# Patient Record
Sex: Female | Born: 1955 | Race: Black or African American | Hispanic: No | Marital: Single | Smoking: Never smoker
Health system: Southern US, Community
[De-identification: ages and names within clinical notes are randomized; demographics above are authoritative.]

## PROBLEM LIST (undated history)

## (undated) DIAGNOSIS — B1920 Unspecified viral hepatitis C without hepatic coma: Secondary | ICD-10-CM

## (undated) DIAGNOSIS — I509 Heart failure, unspecified: Secondary | ICD-10-CM

## (undated) DIAGNOSIS — J449 Chronic obstructive pulmonary disease, unspecified: Secondary | ICD-10-CM

## (undated) DIAGNOSIS — I38 Endocarditis, valve unspecified: Secondary | ICD-10-CM

## (undated) DIAGNOSIS — Z9889 Other specified postprocedural states: Secondary | ICD-10-CM

## (undated) DIAGNOSIS — I10 Essential (primary) hypertension: Secondary | ICD-10-CM

## (undated) DIAGNOSIS — R0681 Apnea, not elsewhere classified: Secondary | ICD-10-CM

## (undated) DIAGNOSIS — Z205 Contact with and (suspected) exposure to viral hepatitis: Secondary | ICD-10-CM

## (undated) HISTORY — DX: Unspecified viral hepatitis C without hepatic coma: B19.20

## (undated) HISTORY — DX: Apnea, not elsewhere classified: R06.81

## (undated) HISTORY — DX: Essential (primary) hypertension: I10

## (undated) HISTORY — DX: Endocarditis, valve unspecified: I38

## (undated) HISTORY — DX: Contact with and (suspected) exposure to viral hepatitis: Z20.5

## (undated) HISTORY — PX: TUBAL LIGATION: SHX77

## (undated) HISTORY — DX: Other specified postprocedural states: Z98.890

---

## 2006-04-28 ENCOUNTER — Other Ambulatory Visit: Payer: Self-pay

## 2006-04-28 ENCOUNTER — Inpatient Hospital Stay: Payer: Self-pay | Admitting: Internal Medicine

## 2011-06-06 ENCOUNTER — Inpatient Hospital Stay: Payer: Self-pay | Admitting: Internal Medicine

## 2011-06-06 LAB — COMPREHENSIVE METABOLIC PANEL
Alkaline Phosphatase: 97 U/L (ref 50–136)
Anion Gap: 13 (ref 7–16)
BUN: 9 mg/dL (ref 7–18)
Bilirubin,Total: 0.6 mg/dL (ref 0.2–1.0)
Chloride: 102 mmol/L (ref 98–107)
Creatinine: 1.2 mg/dL (ref 0.60–1.30)
EGFR (African American): >60
Osmolality: 275 (ref 275–301)
SGPT (ALT): 59 U/L
Sodium: 138 mmol/L (ref 136–145)
Total Protein: 9 g/dL — ABNORMAL HIGH (ref 6.4–8.2)

## 2011-06-06 LAB — CK TOTAL AND CKMB (NOT AT ARMC)
CK, Total: 90 U/L (ref 21–215)
CK, Total: 139 U/L (ref 21–215)
CK-MB: <0.5 ng/mL — ABNORMAL LOW (ref 0.5–3.6)
CK-MB: <0.5 ng/mL — ABNORMAL LOW (ref 0.5–3.6)

## 2011-06-06 LAB — TROPONIN I
Troponin-I: <0.02 ng/mL
Troponin-I: <0.02 ng/mL

## 2011-06-06 LAB — CBC WITH DIFFERENTIAL/PLATELET
Eosinophil #: 0 10*3/uL (ref 0.0–0.7)
Eosinophil %: 0.1 %
Lymphocyte #: 3.2 10*3/uL (ref 1.0–3.6)
Lymphocyte %: 14.9 %
MCHC: 33.6 g/dL (ref 32.0–36.0)
MCV: 90 fL (ref 80–100)
Monocyte %: 8.9 %
Platelet: 227 10*3/uL (ref 150–440)
RDW: 13.4 % (ref 11.5–14.5)
WBC: 21.6 10*3/uL — ABNORMAL HIGH (ref 3.6–11.0)

## 2011-06-06 LAB — RAPID INFLUENZA A&B ANTIGENS

## 2011-06-07 LAB — CBC WITH DIFFERENTIAL/PLATELET
Basophil %: 0.1 %
Eosinophil #: 0 10*3/uL (ref 0.0–0.7)
Eosinophil %: 0 %
HCT: 39.6 % (ref 35.0–47.0)
HGB: 13.5 g/dL (ref 12.0–16.0)
Lymphocyte #: 2.1 10*3/uL (ref 1.0–3.6)
Lymphocyte %: 9.6 %
MCH: 30.8 pg (ref 26.0–34.0)
MCHC: 34.1 g/dL (ref 32.0–36.0)
MCV: 90 fL (ref 80–100)
Monocyte #: 0.4 10*3/uL (ref 0.0–0.7)
Monocyte %: 1.9 %
Neutrophil %: 88.4 %
Platelet: 192 10*3/uL (ref 150–440)
RBC: 4.4 10*6/uL (ref 3.80–5.20)

## 2011-06-07 LAB — BASIC METABOLIC PANEL
BUN: 13 mg/dL (ref 7–18)
Chloride: 102 mmol/L (ref 98–107)
Creatinine: 1.24 mg/dL (ref 0.60–1.30)
EGFR (Non-African Amer.): 48 — ABNORMAL LOW
Osmolality: 278 (ref 275–301)
Potassium: 3.9 mmol/L (ref 3.5–5.1)
Sodium: 138 mmol/L (ref 136–145)

## 2011-06-07 LAB — URINALYSIS, COMPLETE
Blood: NEGATIVE
Glucose,UR: NEGATIVE mg/dL (ref 0–75)
Hyaline Cast: 1
Leukocyte Esterase: NEGATIVE
Nitrite: NEGATIVE
Protein: 100
RBC,UR: <1 /HPF (ref 0–5)
WBC UR: 1 /HPF (ref 0–5)

## 2011-06-07 LAB — CK TOTAL AND CKMB (NOT AT ARMC)
CK, Total: 163 U/L (ref 21–215)
CK-MB: 0.7 ng/mL (ref 0.5–3.6)

## 2011-06-08 LAB — CBC WITH DIFFERENTIAL/PLATELET
Basophil %: 0 %
Eosinophil #: 0 10*3/uL (ref 0.0–0.7)
Eosinophil %: 0 %
HGB: 13.2 g/dL (ref 12.0–16.0)
Lymphocyte #: 1.7 10*3/uL (ref 1.0–3.6)
MCH: 30.4 pg (ref 26.0–34.0)
MCHC: 33.6 g/dL (ref 32.0–36.0)
MCV: 91 fL (ref 80–100)
Monocyte #: 1.1 10*3/uL — ABNORMAL HIGH (ref 0.0–0.7)
Platelet: 272 10*3/uL (ref 150–440)
RBC: 4.33 10*6/uL (ref 3.80–5.20)

## 2011-06-09 LAB — CBC WITH DIFFERENTIAL/PLATELET
Basophil #: 0 10*3/uL (ref 0.0–0.1)
Basophil %: 0.3 %
Eosinophil #: 0 10*3/uL (ref 0.0–0.7)
HCT: 38.3 % (ref 35.0–47.0)
HGB: 12.7 g/dL (ref 12.0–16.0)
Lymphocyte #: 3.7 10*3/uL — ABNORMAL HIGH (ref 1.0–3.6)
MCH: 30.2 pg (ref 26.0–34.0)
MCHC: 33.1 g/dL (ref 32.0–36.0)
Monocyte #: 1.5 10*3/uL — ABNORMAL HIGH (ref 0.0–0.7)
Neutrophil #: 13.5 10*3/uL — ABNORMAL HIGH (ref 1.4–6.5)
RDW: 14.4 % (ref 11.5–14.5)
WBC: 18.8 10*3/uL — ABNORMAL HIGH (ref 3.6–11.0)

## 2011-06-12 LAB — CULTURE, BLOOD (SINGLE)

## 2013-02-17 ENCOUNTER — Ambulatory Visit: Payer: Self-pay | Admitting: Internal Medicine

## 2013-03-06 ENCOUNTER — Ambulatory Visit: Payer: Self-pay | Admitting: Primary Care

## 2013-09-03 DIAGNOSIS — I38 Endocarditis, valve unspecified: Secondary | ICD-10-CM | POA: Insufficient documentation

## 2013-09-03 DIAGNOSIS — R0681 Apnea, not elsewhere classified: Secondary | ICD-10-CM

## 2013-09-03 DIAGNOSIS — E782 Mixed hyperlipidemia: Secondary | ICD-10-CM | POA: Insufficient documentation

## 2013-09-03 HISTORY — DX: Apnea, not elsewhere classified: R06.81

## 2013-09-03 HISTORY — DX: Endocarditis, valve unspecified: I38

## 2014-03-24 DIAGNOSIS — Z9889 Other specified postprocedural states: Secondary | ICD-10-CM | POA: Insufficient documentation

## 2014-03-24 HISTORY — DX: Other specified postprocedural states: Z98.890

## 2014-07-18 NOTE — H&P (Signed)
PATIENT NAME:  Anne [name], Anne [name] MR#:  [id number] DATE OF BIRTH:  13-Dec-1955  DATE OF ADMISSION:  06/06/2011  ER REFERRING PHYSICIAN: Arman Filter, MD   PRIMARY CARE PHYSICIAN: None, has seen Dr. Nehemiah Massed in the past.  CHIEF COMPLAINT: Cough, shortness of breath, fevers, as well as chest pain, pressure ongoing for about one week's duration.   HISTORY OF PRESENT ILLNESS: The patient is a 59 year old African American female with previous history of cardiomyopathy, severe MR, who reports that she stopped taking her medications a [place] time ago due to unable to afford these. She has been having complaints of productive cough, she reports dark brownish in color ongoing for about a week, associated with progressive shortness of breath and also wheezing. She also has generalized body aches and reports subjective fevers. She did not take her temperature. She came to the ED and was noted to have a negative chest x-ray; however, her WBC count was in 21,000s. The patient also has been reporting complaint of chest pain as well as pressure. She reports that occurs with coughing as well as also occurs without her respiratory symptoms. She has also been more short of breath and has had dyspnea on exertion. She denies any lower extremity swelling. She denies any nocturnal dyspnea or orthopnea. She denies any syncope. No abdominal pain, nausea, vomiting, or diarrhea.   PAST MEDICAL HISTORY:  1. History of congestive heart failure requiring hospitalization in 2008.  2. History of cardiomyopathy with ejection fraction of 25%, also severe mitral regurgitation.  3. Hypertension.  4. Previous history of cocaine abuse, none now.   PAST SURGICAL HISTORY: None.   ALLERGIES: None.   CURRENT MEDICATIONS: She is not on any medications.   SOCIAL HISTORY: History of smoking for a total of 5 pack-years, quit 10 months ago. She  drinks occasionally. History of cocaine use in the past, none now.   FAMILY HISTORY: Positive for  hypertension.    REVIEW OF SYSTEMS: CONSTITUTIONAL: Complains of fever, fatigue, weakness. No pain. No weight loss. No weight gain. EYES: No blurred or double vision. No pain. No redness. No inflammation. No glaucoma. No cataracts. ENT: No tinnitus. No ear pain. No hearing loss. No seasonal or year-round allergies. No epistaxis. No nasal discharge. No snoring. No postnasal drip. No sinus pains. No difficulty with swallowing. RESPIRATORY: Complains of cough, wheezing. No hemoptysis. Complains of dyspnea. No chronic obstructive pulmonary disease. No tuberculosis. No pneumonia. CARDIOVASCULAR: Complains of chest pain. No orthopnea. No edema. No arrhythmia. No palpitations. Has history of high blood pressure. GI: No nausea, vomiting, diarrhea. No abdominal pain. No hematemesis. No melena. No ulcers. GU: Denies any dysuria, hematuria, renal calculus, or frequency. ENDOCRINE: Denies any polyuria, nocturia, or thyroid problems. HEME/LYMPH: Denies any anemia, easy bruisability, or bleeding. MUSCULOSKELETAL: Denies any pain in the neck, back, or shoulder. NEUROLOGICAL:   No numbness. No cerebrovascular accident. No seizures. No transient ischemic attack. PSYCHIATRIC: No anxiety. No insomnia.   PHYSICAL EXAMINATION:  VITAL SIGNS: Temperature 100.3, pulse 117, respirations 22, blood pressure 196/113.   GENERAL: The patient is in mild respiratory distress with her cough and looks uncomfortable.   HEENT: Head atraumatic, normocephalic. Pupils are equally round, reactive to light and accommodation. Extraocular movements intact. There is no conjunctival pallor or scleral icterus. Extraocular movements are intact. Nasal exam shows no ulceration or drainage. Oropharynx is clear without any exudates.   NECK: There is no thyromegaly or carotid bruits.   CARDIOVASCULAR: Regular rate and rhythm. No murmurs, rubs, clicks, or  gallops. She has a systolic murmur at the apex.   LUNGS: She has got wheezing, especially in the  left upper lobe. No rales. No crackles. No gallops.   ABDOMEN: Soft, nontender, nondistended. Positive bowel sounds x4.  EXTREMITIES: No clubbing, cyanosis, or edema.   SKIN: There is no rash.   LYMPHATICS: No lymph nodes palpable.   VASCULAR: Good DP, PT pulses.   PSYCHIATRIC: Not anxious or depressed.   MUSCULOSKELETAL: There is no erythema or swelling.   SKIN: No rash.   LABORATORY, DIAGNOSTIC AND RADIOLOGICAL DATA:  Glucose is 115, BUN 9, creatinine 1.2, sodium 138, potassium 3.6, chloride 102, CO2 is 23, calcium is 8.8.  LFTs: Total protein 9.0, albumin of 3.3, total bilirubin 0.6, alkaline phosphatase 97, AST 47, ALT 59.  Troponin less than 0.02.  WBC 21.6, hemoglobin 14.9, platelet count 227.  Influenza A and B were negative.  Chest x-ray shows no acute abnormality.   ASSESSMENT AND PLAN: The patient is a 59 year old African American female with history of cardiomyopathy, not on any medications, presents with shortness of breath, cough, congestion for one week with fever.   1. Shortness of breath, cough, congestion: Likely due to severe bronchitis. We will continue IV Rocephin and azithromycin. Treat her with antitussives. Get blood cultures.  2. Bronchospasms: Likely due to acute bronchitis with active wheezing. We will give her nebulizers, steroids.  3. History of severe cardiomyopathy with chest pressure with cough: At this time we will check serial enzymes, repeat an echocardiogram. May need cardiology to follow back up.  4. Hypertension: Not taking any medications. We will start her on lisinopril as well as beta blocker, which will also help with the heart rate.  5. Miscellaneous: I will place her on Lovenox for deep vein thrombosis prophylaxis.     TIME SPENT:   35 minutes.  ____________________________ Lafonda Mosses Posey Pronto, MD shp:cbb D: 06/06/2011 11:18:13 ET T: 06/06/2011 12:23:02 ET JOB#: [id number]  cc: Vestal Crandall H. Posey Pronto, MD, <Dictator> Alric Seton  MD ELECTRONICALLY SIGNED 06/09/2011 13:13

## 2014-07-18 NOTE — Discharge Summary (Signed)
PATIENT NAME:  Anne [name], Anne [name] MR#:  [id number] DATE OF BIRTH:  [birth year]  DATE OF ADMISSION:  06/06/2011 DATE OF DISCHARGE:  06/09/2011  DISCHARGE DIAGNOSES:  1. Chronic obstructive pulmonary disease exacerbation.  2. Acute bronchitis.  3. History of cardiomyopathy with an ejection fraction of 25%.  4. Hypertension.  5. Gastroesophageal reflux disease.   DISCHARGE MEDICATIONS:  1. Lisinopril 10 mg daily.  2. Mucinex 600 mg twice a day. 3. Metoprolol 50 mg twice a day. 4. Omeprazole 20 mg daily.  5. Prednisone 10 mg four tabs daily x3 days then reduce x 10 mg every three days 6. Albuterol metered-dose inhaler 2 puffs every 2 hours p.r.n.  7. Spiriva 1 puff daily.  8. Advair 250/50 one puff twice a day. 9. Zithromax 250 mg daily x3 days. 10. Nebulizer p.r.n.   REASON FOR ADMISSION: This is a 59 year old female who came to the ED with shortness of breath, coughing, and wheezing. She had a negative chest x-ray, but had a white count over 21,000. She was admitted for further treatment.   HOSPITAL COURSE:  1. Chronic obstructive pulmonary disease exacerbation: She was wheezing and in some moderate distress. She was treated with bronchodilators and steroid taper. She will finish the steroid taper at home, continue bronchodilators, and follow-up with the [facility].  2. Acute bronchitis: She was treated with IV antibiotics and this was changed over to p.o. and she will finish a full course. 3. Cardiomyopathy: She was restarted back on her ACE inhibitor and will follow-up again at the [facility].  4. Hypertension: She was started back on an ACE inhibitor and beta blocker.   DISPOSITION: The patient is discharged in stable condition. She will follow-up with the [facility] in 1 to 2 weeks.   TIME SPENT ON DISCHARGE: 35 minutes. ____________________________ Baxter Hire, MD jdj:slb D: 06/09/2011 09:00:27 ET T: 06/09/2011 15:01:56 ET JOB#: [id number]  cc: Baxter Hire,  MD, <Dictator> [facility] Baxter Hire MD ELECTRONICALLY SIGNED 06/09/2011 15:15

## 2014-07-19 ENCOUNTER — Emergency Department
Admit: 2014-07-19 | Disposition: A | Discharge status: Home / Self Care | Payer: Self-pay | Admitting: Emergency Medicine

## 2014-10-11 ENCOUNTER — Other Ambulatory Visit: Payer: Self-pay | Admitting: Primary Care

## 2014-10-11 DIAGNOSIS — Z139 Encounter for screening, unspecified: Secondary | ICD-10-CM

## 2014-10-15 ENCOUNTER — Other Ambulatory Visit: Payer: Self-pay | Admitting: Primary Care

## 2014-10-15 DIAGNOSIS — B1920 Unspecified viral hepatitis C without hepatic coma: Secondary | ICD-10-CM

## 2014-10-15 DIAGNOSIS — R748 Abnormal levels of other serum enzymes: Secondary | ICD-10-CM

## 2014-10-19 ENCOUNTER — Ambulatory Visit
Admission: RE | Admit: 2014-10-19 | Discharge: 2014-10-19 | Disposition: A | Discharge status: Home / Self Care | Payer: Medicaid Other | Source: Ambulatory Visit | Attending: Primary Care | Admitting: Primary Care

## 2014-10-19 DIAGNOSIS — Z139 Encounter for screening, unspecified: Secondary | ICD-10-CM

## 2014-10-19 DIAGNOSIS — Z1231 Encounter for screening mammogram for malignant neoplasm of breast: Secondary | ICD-10-CM | POA: Insufficient documentation

## 2014-10-20 ENCOUNTER — Ambulatory Visit
Admission: RE | Admit: 2014-10-20 | Discharge: 2014-10-20 | Disposition: A | Discharge status: Home / Self Care | Payer: Medicaid Other | Source: Ambulatory Visit | Attending: Primary Care | Admitting: Primary Care

## 2014-10-20 DIAGNOSIS — B1920 Unspecified viral hepatitis C without hepatic coma: Secondary | ICD-10-CM | POA: Insufficient documentation

## 2014-10-20 DIAGNOSIS — R748 Abnormal levels of other serum enzymes: Secondary | ICD-10-CM | POA: Diagnosis present

## 2014-11-16 DIAGNOSIS — I10 Essential (primary) hypertension: Secondary | ICD-10-CM | POA: Insufficient documentation

## 2014-11-16 HISTORY — DX: Essential (primary) hypertension: I10

## 2014-12-06 ENCOUNTER — Encounter: Payer: Self-pay | Admitting: Gastroenterology

## 2014-12-10 DIAGNOSIS — Z205 Contact with and (suspected) exposure to viral hepatitis: Secondary | ICD-10-CM | POA: Insufficient documentation

## 2014-12-10 HISTORY — DX: Contact with and (suspected) exposure to viral hepatitis: Z20.5

## 2014-12-31 NOTE — Telephone Encounter (Signed)
error 

## 2015-01-06 ENCOUNTER — Other Ambulatory Visit: Payer: Self-pay

## 2015-01-10 ENCOUNTER — Ambulatory Visit: Payer: Self-pay | Admitting: Gastroenterology

## 2015-01-12 ENCOUNTER — Other Ambulatory Visit: Payer: Self-pay

## 2015-01-12 ENCOUNTER — Ambulatory Visit (INDEPENDENT_AMBULATORY_CARE_PROVIDER_SITE_OTHER): Payer: Medicaid Other | Admitting: Gastroenterology

## 2015-01-12 VITALS — BP 156/67 | HR 48 | Temp 97.8°F | Ht 63.0 in | Wt 174.0 lb

## 2015-01-12 DIAGNOSIS — B1920 Unspecified viral hepatitis C without hepatic coma: Secondary | ICD-10-CM

## 2015-01-12 DIAGNOSIS — B182 Chronic viral hepatitis C: Secondary | ICD-10-CM

## 2015-01-13 ENCOUNTER — Other Ambulatory Visit
Admission: RE | Admit: 2015-01-13 | Discharge: 2015-01-13 | Disposition: A | Discharge status: Home / Self Care | Payer: Medicaid Other | Source: Ambulatory Visit | Attending: Gastroenterology | Admitting: Gastroenterology

## 2015-01-13 DIAGNOSIS — B182 Chronic viral hepatitis C: Secondary | ICD-10-CM | POA: Insufficient documentation

## 2015-01-13 LAB — CBC WITH DIFFERENTIAL/PLATELET
BASOS ABS: 0.1 10*3/uL (ref 0–0.1)
BASOS PCT: 1 %
EOS ABS: 0.1 10*3/uL (ref 0–0.7)
EOS PCT: 3 %
HCT: 34.9 % — ABNORMAL LOW (ref 35.0–47.0)
Hemoglobin: 11.9 g/dL — ABNORMAL LOW (ref 12.0–16.0)
Lymphocytes Relative: 37 %
Lymphs Abs: 2.1 10*3/uL (ref 1.0–3.6)
MCH: 31.2 pg (ref 26.0–34.0)
MCHC: 34.1 g/dL (ref 32.0–36.0)
MCV: 91.4 fL (ref 80.0–100.0)
Monocytes Absolute: 0.9 10*3/uL (ref 0.2–0.9)
Monocytes Relative: 16 %
NEUTROS PCT: 43 %
Neutro Abs: 2.5 10*3/uL (ref 1.4–6.5)
PLATELETS: 168 10*3/uL (ref 150–440)
RBC: 3.82 MIL/uL (ref 3.80–5.20)
RDW: 14.7 % — ABNORMAL HIGH (ref 11.5–14.5)
WBC: 5.7 10*3/uL (ref 3.6–11.0)

## 2015-01-13 LAB — COMPREHENSIVE METABOLIC PANEL
ALBUMIN: 3.4 g/dL — AB (ref 3.5–5.0)
ALT: 114 U/L — ABNORMAL HIGH (ref 14–54)
ANION GAP: 5 (ref 5–15)
AST: 132 U/L — ABNORMAL HIGH (ref 15–41)
Alkaline Phosphatase: 59 U/L (ref 38–126)
BILIRUBIN TOTAL: 0.7 mg/dL (ref 0.3–1.2)
BUN: 22 mg/dL — ABNORMAL HIGH (ref 6–20)
CHLORIDE: 112 mmol/L — AB (ref 101–111)
CO2: 25 mmol/L (ref 22–32)
Calcium: 9.3 mg/dL (ref 8.9–10.3)
Creatinine, Ser: 1.61 mg/dL — ABNORMAL HIGH (ref 0.44–1.00)
GFR calc Af Amer: 40 mL/min — ABNORMAL LOW (ref >60)
GFR calc non Af Amer: 34 mL/min — ABNORMAL LOW (ref >60)
GLUCOSE: 100 mg/dL — AB (ref 65–99)
POTASSIUM: 4.2 mmol/L (ref 3.5–5.1)
SODIUM: 142 mmol/L (ref 135–145)
TOTAL PROTEIN: 7.3 g/dL (ref 6.5–8.1)

## 2015-01-13 LAB — PROTIME-INR
INR: 1.01
PROTHROMBIN TIME: 13.5 s (ref 11.4–15.0)

## 2015-01-13 NOTE — Progress Notes (Signed)
Gastroenterology Consultation  Referring Provider:     Freddy Finner, NP Primary Care Physician:  Freddy Finner, NP Primary Gastroenterologist:  Dr. Allen Norris     Reason for Consultation:     HCV        HPI:   Anne [name] is a 59 y.o. y/o female referred for consultation & management of Hepatitis C by Freddy Finner, NP.  This patient comes here today with a history of hepatitis C. The patient has antibody to Hepatitis C.The patient reports that she has no high-risk exposure to hepatitis C except she states that she used to be involved with a alcoholic. She denies any IV drug use. She denies any history of transfusions before 1990. The patient also denies any homemade tattoos although she does report snorting cocaine many years ago. The patient was also noted to have abnormal liver enzymes. There is no report of any jaundice or unexplained abdominal pain.  Past Medical History  Diagnosis Date  . Benign essential HTN 11/16/2014  . Heart valve disease 09/03/2013  . Breathlessness on exertion 09/03/2013  . Exposure to hepatitis B 12/10/2014  . History of cardiac catheterization 03/24/2014    Overview:  With normal coronaries 2015   . Hepatitis C     Past Surgical History  Procedure Laterality Date  . Tubal ligation      Prior to Admission medications   Medication Sig Start Date End Date Taking? Authorizing Provider  albuterol (PROAIR HFA) 108 (90 BASE) MCG/ACT inhaler Inhale into the lungs.   Yes Historical Provider, MD  carvedilol (COREG) 25 MG tablet Take by mouth. 12/10/14 12/10/15 Yes Historical Provider, MD  Fluticasone-Salmeterol (ADVAIR DISKUS) 250-50 MCG/DOSE AEPB Inhale into the lungs.   Yes Historical Provider, MD  hydrochlorothiazide (HYDRODIURIL) 25 MG tablet Take by mouth.   Yes Historical Provider, MD  losartan (COZAAR) 100 MG tablet Take by mouth. 03/24/14 03/24/15 Yes Historical Provider, MD  omeprazole (PRILOSEC) 20 MG capsule Take by mouth. 09/29/14 09/29/15 Yes Historical  Provider, MD  tiotropium (SPIRIVA) 18 MCG inhalation capsule Place into inhaler and inhale.   Yes Historical Provider, MD    Family History  Problem Relation Age of Onset  . Hypertension Mother   . Arthritis Mother      Social History  Substance Use Topics  . Smoking status: Never Smoker   . Smokeless tobacco: Never Used  . Alcohol Use: 0.6 oz/week    1 Cans of beer per week     Comment: Occasional     Allergies as of 01/12/2015 - Review Complete 01/12/2015  Allergen Reaction Noted  . Ibuprofen  01/12/2015  . Nitrofuran derivatives Other (See Comments) 01/06/2015    Review of Systems:    All systems reviewed and negative except where noted in HPI.   Physical Exam:  BP 156/67 mmHg  Pulse 48  Temp(Src) 97.8 F (36.6 C) (Oral)  Ht 5\' 3"  (1.6 m)  Wt 174 lb (78.926 kg)  BMI 30.83 kg/m2 No LMP recorded. Patient is postmenopausal. Psych:  Alert and cooperative. Normal mood and affect. General:   Alert,  Well-developed, well-nourished, pleasant and cooperative in NAD Head:  Normocephalic and atraumatic. Eyes:  Sclera clear, no icterus.   Conjunctiva pink. Ears:  Normal auditory acuity. Nose:  No deformity, discharge, or lesions. Mouth:  No deformity or lesions,oropharynx pink & moist. Neck:  Supple; no masses or thyromegaly. Lungs:  Respirations even and unlabored.  Clear throughout to auscultation.   No wheezes, crackles, or rhonchi. No  acute distress. Heart:  Regular rate and rhythm; no murmurs, clicks, rubs, or gallops. Abdomen:  Normal bowel sounds.  No bruits.  Soft, non-tender and non-distended without masses, hepatosplenomegaly or hernias noted.  No guarding or rebound tenderness.  Negative Carnett sign.   Rectal:  Deferred.  Msk:  Symmetrical without gross deformities.  Good, equal movement & strength bilaterally. Pulses:  Normal pulses noted. Extremities:  No clubbing or edema.  No cyanosis. Neurologic:  Alert and oriented x3;  grossly normal  neurologically. Skin:  Intact without significant lesions or rashes.  No jaundice. Lymph Nodes:  No significant cervical adenopathy. Psych:  Alert and cooperative. Normal mood and affect.  Imaging Studies: No results found.  Assessment and Plan:   [name] is a 59 y.o. y/o female Who comes in today with a history of possible hepatitis C. The patient has abnormal liver enzymes and is now being sent for further evaluation. The patient will be set up to have last check for a hepatitis C genotype and viral load. She will also have lab sent off to look for other possible causes of her abnormal liver enzymes. A ultrasound with a liver elasticity test will be ordered. If the patient's hepatitis C comes back as positive she will need treatment for this. The patient has been explained the plan and agrees with it.  Note: This dictation was prepared with Dragon dictation along with smaller phrase technology. Any transcriptional errors that result from this process are unintentional.

## 2015-01-17 ENCOUNTER — Telehealth: Payer: Self-pay

## 2015-01-17 LAB — HEPATITIS C ANTIBODY

## 2015-01-17 LAB — HEPATITIS B SURFACE ANTIBODY, QUANTITATIVE

## 2015-01-17 LAB — ANTI-SMOOTH MUSCLE ANTIBODY, IGG: F-ACTIN AB IGG: 18 U (ref 0–19)

## 2015-01-17 LAB — HCV RNA QUANT
HCV QUANT LOG: 6.049 {Log_IU}/mL (ref >1.70)
HCV Quantitative: 1120000 IU/mL (ref >50)

## 2015-01-17 LAB — HEPATITIS A ANTIBODY, TOTAL: Hep A Total Ab: POSITIVE — AB

## 2015-01-17 LAB — ANTINUCLEAR ANTIBODIES, IFA: ANA Ab, IFA: NEGATIVE

## 2015-01-17 LAB — ALPHA-1 ANTITRYPSIN PHENOTYPE: A1 ANTITRYPSIN SER: 187 mg/dL (ref 90–200)

## 2015-01-17 LAB — IGG, IGA, IGM
IGA: 446 mg/dL — AB (ref 87–352)
IGM, SERUM: 68 mg/dL (ref 26–217)
IgG (Immunoglobin G), Serum: 1334 mg/dL (ref 700–1600)

## 2015-01-17 LAB — CERULOPLASMIN: CERULOPLASMIN: 29.9 mg/dL (ref 19.0–39.0)

## 2015-01-17 LAB — HEPATITIS C GENOTYPE

## 2015-01-17 LAB — MITOCHONDRIAL ANTIBODIES: Mitochondrial M2 Ab, IgG: 15.9 Units (ref 0.0–20.0)

## 2015-01-17 NOTE — Telephone Encounter (Signed)
-----   Message from Lucilla Lame, MD sent at 01/17/2015  1:38 PM EDT ----- Let patient know she is immune to hep A but needs vaccination for B. She also needs to be set up for treatment of her HCV.

## 2015-01-17 NOTE — Telephone Encounter (Signed)
Pt notified of results

## 2015-01-17 NOTE — Telephone Encounter (Signed)
Pt scheduled for RUQ [place] limited and tissue elastography at [facility] on Monday, Oct 31st at 8:00am. Pt was given instructions for scan. Pt was also notified of lab results and the need for Hep B vaccine.

## 2015-01-18 LAB — HEPATITIS B SURFACE ANTIGEN
HEP B S AG: NEGATIVE
HEP B S AG: NEGATIVE

## 2015-01-24 ENCOUNTER — Ambulatory Visit
Admission: RE | Admit: 2015-01-24 | Discharge: 2015-01-24 | Disposition: A | Discharge status: Home / Self Care | Payer: Medicaid Other | Source: Ambulatory Visit | Attending: Gastroenterology | Admitting: Gastroenterology

## 2015-01-24 DIAGNOSIS — B182 Chronic viral hepatitis C: Secondary | ICD-10-CM | POA: Insufficient documentation

## 2015-01-26 ENCOUNTER — Telehealth: Payer: Self-pay

## 2015-01-26 NOTE — Telephone Encounter (Signed)
-----   Message from Lucilla Lame, MD sent at 01/24/2015 11:42 AM EDT ----- Let the patient know that her fibrosis score was asked to F2-F3 out of 4. She should also be started on her treatment for hep C when approved.

## 2015-01-26 NOTE — Telephone Encounter (Signed)
Pt notified of [place] results. Paperwork completed and faxed to [employer]. Waiting for approval.

## 2015-02-11 ENCOUNTER — Telehealth: Payer: Self-pay | Admitting: Gastroenterology

## 2015-02-11 NOTE — Telephone Encounter (Signed)
Patient wanted to let you know that she started her medication for Hep C on Wednesday 11/16

## 2015-02-12 NOTE — Telephone Encounter (Signed)
Noted  

## 2015-03-22 ENCOUNTER — Telehealth: Payer: Self-pay | Admitting: Gastroenterology

## 2015-03-22 NOTE — Telephone Encounter (Signed)
Medication for Hep C is making her weak. Is there anything she can do about it? Please call.

## 2015-03-23 NOTE — Telephone Encounter (Signed)
Spoke with pt regarding her symptoms of fatigue. Advised pt unfortunately this is a side of the Hep C medication and there is nothing we can give her to increase her energy. Told her that once her treatment has been completed she should start feeling back to normal, but until then get plenty of rest and drinks lots of fluid. Pt understood and will contact me if any other symptoms occur.

## 2015-03-24 ENCOUNTER — Telehealth: Payer: Self-pay | Admitting: Gastroenterology

## 2015-03-24 ENCOUNTER — Other Ambulatory Visit: Payer: Self-pay

## 2015-03-24 DIAGNOSIS — B182 Chronic viral hepatitis C: Secondary | ICD-10-CM

## 2015-03-24 NOTE — Telephone Encounter (Signed)
Medicaid called asking for updates labs on patient for the new year refill, didn't see that any have been done, She said send patient for labs

## 2015-03-24 NOTE — Telephone Encounter (Signed)
Patient has a question about medication

## 2015-03-24 NOTE — Telephone Encounter (Signed)
Spoke with pt regarding labs needing to be repeat. Pt will go tomorrow to [facility]. Order has been put in EPIC.

## 2015-03-25 ENCOUNTER — Other Ambulatory Visit
Admission: RE | Admit: 2015-03-25 | Discharge: 2015-03-25 | Disposition: A | Discharge status: Home / Self Care | Payer: Medicaid Other | Source: Ambulatory Visit | Attending: Gastroenterology | Admitting: Gastroenterology

## 2015-03-25 DIAGNOSIS — B182 Chronic viral hepatitis C: Secondary | ICD-10-CM

## 2015-03-25 NOTE — Telephone Encounter (Signed)
Tried contacting pt but no vm was available to leave message.

## 2015-03-28 ENCOUNTER — Inpatient Hospital Stay
Admission: EM | Admit: 2015-03-28 | Discharge: 2015-03-31 | DRG: 314 | Disposition: A | Discharge status: Home / Self Care | Payer: Medicaid Other | Attending: Internal Medicine | Admitting: Internal Medicine

## 2015-03-28 DIAGNOSIS — I10 Essential (primary) hypertension: Secondary | ICD-10-CM | POA: Diagnosis present

## 2015-03-28 DIAGNOSIS — N170 Acute kidney failure with tubular necrosis: Secondary | ICD-10-CM | POA: Diagnosis present

## 2015-03-28 DIAGNOSIS — R531 Weakness: Secondary | ICD-10-CM | POA: Diagnosis present

## 2015-03-28 DIAGNOSIS — I959 Hypotension, unspecified: Secondary | ICD-10-CM | POA: Diagnosis present

## 2015-03-28 DIAGNOSIS — Y92009 Unspecified place in unspecified non-institutional (private) residence as the place of occurrence of the external cause: Secondary | ICD-10-CM | POA: Diagnosis not present

## 2015-03-28 DIAGNOSIS — R112 Nausea with vomiting, unspecified: Secondary | ICD-10-CM | POA: Diagnosis present

## 2015-03-28 DIAGNOSIS — T465X5A Adverse effect of other antihypertensive drugs, initial encounter: Secondary | ICD-10-CM | POA: Diagnosis present

## 2015-03-28 DIAGNOSIS — J449 Chronic obstructive pulmonary disease, unspecified: Secondary | ICD-10-CM | POA: Diagnosis present

## 2015-03-28 DIAGNOSIS — Z79899 Other long term (current) drug therapy: Secondary | ICD-10-CM

## 2015-03-28 DIAGNOSIS — T375X5A Adverse effect of antiviral drugs, initial encounter: Secondary | ICD-10-CM | POA: Diagnosis present

## 2015-03-28 DIAGNOSIS — N179 Acute kidney failure, unspecified: Secondary | ICD-10-CM | POA: Diagnosis present

## 2015-03-28 DIAGNOSIS — D649 Anemia, unspecified: Secondary | ICD-10-CM | POA: Diagnosis present

## 2015-03-28 DIAGNOSIS — Z8261 Family history of arthritis: Secondary | ICD-10-CM | POA: Diagnosis not present

## 2015-03-28 DIAGNOSIS — E875 Hyperkalemia: Principal | ICD-10-CM | POA: Diagnosis present

## 2015-03-28 DIAGNOSIS — Z8249 Family history of ischemic heart disease and other diseases of the circulatory system: Secondary | ICD-10-CM

## 2015-03-28 DIAGNOSIS — B1920 Unspecified viral hepatitis C without hepatic coma: Secondary | ICD-10-CM | POA: Diagnosis present

## 2015-03-28 DIAGNOSIS — I5032 Chronic diastolic (congestive) heart failure: Secondary | ICD-10-CM | POA: Diagnosis present

## 2015-03-28 HISTORY — DX: Chronic obstructive pulmonary disease, unspecified: J44.9

## 2015-03-28 HISTORY — DX: Heart failure, unspecified: I50.9

## 2015-03-28 LAB — COMPREHENSIVE METABOLIC PANEL
ALT: 48 U/L (ref 14–54)
AST: 26 U/L (ref 15–41)
Albumin: 3.2 g/dL — ABNORMAL LOW (ref 3.5–5.0)
Alkaline Phosphatase: 56 U/L (ref 38–126)
Anion gap: 5 (ref 5–15)
BILIRUBIN TOTAL: 1.2 mg/dL (ref 0.3–1.2)
BUN: 40 mg/dL — AB (ref 6–20)
CALCIUM: 9.3 mg/dL (ref 8.9–10.3)
CO2: 19 mmol/L — ABNORMAL LOW (ref 22–32)
CREATININE: 3.5 mg/dL — AB (ref 0.44–1.00)
Chloride: 113 mmol/L — ABNORMAL HIGH (ref 101–111)
GFR calc Af Amer: 16 mL/min — ABNORMAL LOW (ref >60)
GFR, EST NON AFRICAN AMERICAN: 13 mL/min — AB (ref >60)
Glucose, Bld: 123 mg/dL — ABNORMAL HIGH (ref 65–99)
Potassium: 7.3 mmol/L (ref 3.5–5.1)
Sodium: 137 mmol/L (ref 135–145)
TOTAL PROTEIN: 6.7 g/dL (ref 6.5–8.1)

## 2015-03-28 LAB — URINALYSIS COMPLETE WITH MICROSCOPIC (ARMC ONLY)
BILIRUBIN URINE: NEGATIVE
Bacteria, UA: NONE SEEN
Glucose, UA: NEGATIVE mg/dL
Hgb urine dipstick: NEGATIVE
KETONES UR: NEGATIVE mg/dL
NITRITE: NEGATIVE
Protein, ur: NEGATIVE mg/dL
Specific Gravity, Urine: 1.008 (ref 1.005–1.030)
pH: 5 (ref 5.0–8.0)

## 2015-03-28 LAB — CBC WITH DIFFERENTIAL/PLATELET
BASOS ABS: 0 10*3/uL (ref 0–0.1)
BASOS PCT: 0 %
EOS ABS: 0.1 10*3/uL (ref 0–0.7)
Eosinophils Relative: 1 %
HCT: 22 % — ABNORMAL LOW (ref 35.0–47.0)
HEMOGLOBIN: 7.2 g/dL — AB (ref 12.0–16.0)
Lymphocytes Relative: 19 %
Lymphs Abs: 1.5 10*3/uL (ref 1.0–3.6)
MCH: 30.6 pg (ref 26.0–34.0)
MCHC: 32.7 g/dL (ref 32.0–36.0)
MCV: 93.5 fL (ref 80.0–100.0)
MONOS PCT: 10 %
Monocytes Absolute: 0.8 10*3/uL (ref 0.2–0.9)
NEUTROS PCT: 70 %
Neutro Abs: 5.5 10*3/uL (ref 1.4–6.5)
Platelets: 218 10*3/uL (ref 150–440)
RBC: 2.36 MIL/uL — ABNORMAL LOW (ref 3.80–5.20)
RDW: 13.9 % (ref 11.5–14.5)
WBC: 8 10*3/uL (ref 3.6–11.0)

## 2015-03-28 LAB — ABO/RH: ABO/RH(D): O POS

## 2015-03-28 LAB — LACTIC ACID, PLASMA: LACTIC ACID, VENOUS: 1.4 mmol/L (ref 0.5–2.0)

## 2015-03-28 LAB — PREPARE RBC (CROSSMATCH)

## 2015-03-28 MED ORDER — HEPARIN SODIUM (PORCINE) 5000 UNIT/ML IJ SOLN
5000.0000 [IU] | Freq: Three times a day (TID) | INTRAMUSCULAR | Status: DC
  Administered 2015-03-29 – 2015-03-31 (×7): 5000 [IU] via SUBCUTANEOUS
  Filled 2015-03-28 (×7): qty 1

## 2015-03-28 MED ORDER — SODIUM CHLORIDE 0.9 % IV SOLN
INTRAVENOUS | Status: DC
  Administered 2015-03-29 – 2015-03-31 (×6): via INTRAVENOUS

## 2015-03-28 MED ORDER — SODIUM CHLORIDE 0.9 % IV SOLN
1.0000 g | Freq: Once | INTRAVENOUS | Status: AC
  Administered 2015-03-29: 1 g via INTRAVENOUS
  Filled 2015-03-28: qty 10

## 2015-03-28 MED ORDER — SODIUM CHLORIDE 0.9 % IV BOLUS (SEPSIS)
1000.0000 mL | Freq: Once | INTRAVENOUS | Status: AC
  Administered 2015-03-29: 1000 mL via INTRAVENOUS

## 2015-03-28 MED ORDER — ACETAMINOPHEN 325 MG PO TABS: 650.0000 mg | ORAL_TABLET | Freq: Four times a day (QID) | ORAL | Status: DC | PRN

## 2015-03-28 MED ORDER — DEXTROSE 50 % IV SOLN
25.0000 mL | Freq: Once | INTRAVENOUS | Status: AC
  Administered 2015-03-29: 25 mL via INTRAVENOUS
  Filled 2015-03-28: qty 50

## 2015-03-28 MED ORDER — OXYCODONE HCL 5 MG PO TABS
5.0000 mg | ORAL_TABLET | ORAL | Status: DC | PRN
Start: 2015-03-28 — End: 2015-03-31

## 2015-03-28 MED ORDER — ONDANSETRON HCL 4 MG/2ML IJ SOLN: 4.0000 mg | Freq: Four times a day (QID) | INTRAMUSCULAR | Status: DC | PRN

## 2015-03-28 MED ORDER — MORPHINE SULFATE (PF) 2 MG/ML IV SOLN
2.0000 mg | INTRAVENOUS | Status: DC | PRN
Start: 2015-03-28 — End: 2015-03-31

## 2015-03-28 MED ORDER — INSULIN ASPART 100 UNIT/ML ~~LOC~~ SOLN
SUBCUTANEOUS | Status: AC
  Administered 2015-03-29: 10 [IU] via INTRAVENOUS
  Filled 2015-03-28: qty 10

## 2015-03-28 MED ORDER — INSULIN ASPART 100 UNIT/ML IV SOLN
10.0000 [IU] | Freq: Once | INTRAVENOUS | Status: AC
  Administered 2015-03-29: 10 [IU] via INTRAVENOUS

## 2015-03-28 MED ORDER — SODIUM CHLORIDE 0.9 % IV BOLUS (SEPSIS)
1000.0000 mL | Freq: Once | INTRAVENOUS | Status: AC
  Administered 2015-03-28: 1000 mL via INTRAVENOUS

## 2015-03-28 MED ORDER — SODIUM CHLORIDE 0.9 % IV SOLN
10.0000 mL/h | Freq: Once | INTRAVENOUS | Status: AC
  Administered 2015-03-29: 10 mL/h via INTRAVENOUS

## 2015-03-28 MED ORDER — ONDANSETRON HCL 4 MG PO TABS: 4.0000 mg | ORAL_TABLET | Freq: Four times a day (QID) | ORAL | Status: DC | PRN

## 2015-03-28 MED ORDER — SODIUM CHLORIDE 0.9 % IJ SOLN
3.0000 mL | Freq: Two times a day (BID) | INTRAMUSCULAR | Status: DC
  Administered 2015-03-29: 3 mL via INTRAVENOUS

## 2015-03-28 MED ORDER — SODIUM POLYSTYRENE SULFONATE 15 GM/60ML PO SUSP
30.0000 g | Freq: Once | ORAL | Status: AC
  Administered 2015-03-29: 30 g via ORAL
  Filled 2015-03-28: qty 120

## 2015-03-28 MED ORDER — ACETAMINOPHEN 650 MG RE SUPP: 650.0000 mg | Freq: Four times a day (QID) | RECTAL | Status: DC | PRN

## 2015-03-28 NOTE — ED Notes (Signed)
Pt states she was put on "double her dose of high BP meds on December 20th" and then her BP was checked by her doctor and her BP was low. Her doctor told her they might need to lower her medication since her BP was low last time it was checked. Today pt fell in kitchen, then fell on to bed. Pt states her legs felt weak and just gave out. Pt states no weakness or dizziness at this moment, no headache, no CP. Pt states she threw up once today. EMS gave 588ml bolus en route for low BP.

## 2015-03-28 NOTE — ED Notes (Signed)
Pt presents to ED via EMS with hypotension. Pt has diarrhea, N&V.

## 2015-03-28 NOTE — ED Provider Notes (Signed)
[facility] Emergency Department Provider Note   ____________________________________________  Time seen: On EMS arrival  I have reviewed the triage vital signs and the nursing notes.   HISTORY  Chief Complaint Hypotension   History limited by: Not Limited   HPI Anne [name] is a 60 y.o. female who presents to the emergency department today via EMS because of concerns for weakness, diarrhea. The patient states that she has been having issues with diarrhea for the past couple of months since starting a new medication for her hepatitis C. She states for the past week though she has developed nausea and vomiting as well as the diarrhea. She has had multiple episodes. She describes it as watery. She has not noticed any blood in the vomit or diarrhea. During this time she has felt increasingly weak. She did have a fall. Furthermore the patient states that she has been adjusting her blood pressure medication with her primary care doctor. She denies any significant abdominal pain. Denies any fevers.    Past Medical History  Diagnosis Date  . Benign essential HTN 11/16/2014  . Heart valve disease 09/03/2013  . Breathlessness on exertion 09/03/2013  . Exposure to hepatitis B 12/10/2014  . History of cardiac catheterization 03/24/2014    Overview:  With normal coronaries 2015   . Hepatitis C   . CHF (congestive heart failure) ([place])   . COPD (chronic obstructive pulmonary disease) [facility])     Patient Active Problem List   Diagnosis Date Noted  . Hepatitis C 01/12/2015  . Exposure to hepatitis B 12/10/2014  . Benign essential HTN 11/16/2014  . History of cardiac catheterization 03/24/2014  . Breathlessness on exertion 09/03/2013  . 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 Heart valve disease 09/03/2013    Past Surgical History  Procedure Laterality Date  . Tubal ligation      Current Outpatient Rx  Name  Route  Sig  Dispense  Refill  .  albuterol (PROAIR HFA) 108 (90 BASE) MCG/ACT inhaler   Inhalation   Inhale into the lungs.         . carvedilol (COREG) 25 MG tablet   Oral   Take by mouth.         . Dasab-Ombitas-Paritapre-Ritona (VIEKIRA XR) 200-8.33-50- 33.33 MG TB24   Oral   Take by mouth.         . Fluticasone-Salmeterol (ADVAIR DISKUS) 250-50 MCG/DOSE AEPB   Inhalation   Inhale into the lungs.         . hydrochlorothiazide (HYDRODIURIL) 25 MG tablet   Oral   Take by mouth.         . EXPIRED: losartan (COZAAR) 100 MG tablet   Oral   Take by mouth.         Marland Kitchen omeprazole (PRILOSEC) 20 MG capsule   Oral   Take by mouth.         . ribavirin (REBETOL) 200 MG capsule   Oral   Take by mouth.         . tiotropium (SPIRIVA) 18 MCG inhalation capsule   Inhalation   Place into inhaler and inhale.           Allergies Ibuprofen and Nitrofuran derivatives  Family History  Problem Relation Age of Onset  . Hypertension Mother   . Arthritis Mother     Social History Social History  Substance Use Topics  . Smoking status: Never Smoker   . Smokeless tobacco: Never Used  .  Alcohol Use: 0.6 oz/week    1 Cans of beer per week     Comment: Occasional     Review of Systems  Constitutional: Negative for fever. Cardiovascular: Negative for chest pain. Respiratory: Negative for shortness of breath. Gastrointestinal: Negative for abdominal pain. Positive for nausea vomiting and diarrhea Neurological: Negative for headaches, focal weakness or numbness.   10-point ROS otherwise negative.  ____________________________________________   PHYSICAL EXAM:  VITAL SIGNS: ED Triage Vitals  Enc Vitals Group     BP 03/28/15 2029 86/58 mmHg     Pulse Rate 03/28/15 2029 82     Resp 03/28/15 2029 14     Temp 03/28/15 2029 97.9 F (36.6 C)     Temp src --      SpO2 03/28/15 2029 99 %     Weight 03/28/15 2029 155 lb (70.308 kg)     Height 03/28/15 2029 5\' 3"  (1.6 m)     Head Cir --       Peak Flow --      Pain Score 03/28/15 2026 0   Constitutional: Alert and oriented. Well appearing and in no distress. Eyes: Conjunctivae are normal. PERRL. Normal extraocular movements. ENT   Head: Normocephalic and atraumatic.   Nose: No congestion/rhinnorhea.   Mouth/Throat: Mucous membranes are moist.   Neck: No stridor. Hematological/Lymphatic/Immunilogical: No cervical lymphadenopathy. Cardiovascular: Normal rate, regular rhythm.  No murmurs, rubs, or gallops. Respiratory: Normal respiratory effort without tachypnea nor retractions. Breath sounds are clear and equal bilaterally. No wheezes/rales/rhonchi. Gastrointestinal: Soft and nontender. No distention. GUIAC negative. Brown stool on the glove. Genitourinary: Deferred Musculoskeletal: Normal range of motion in all extremities. No joint effusions.  No lower extremity tenderness nor edema. Neurologic:  Normal speech and language. No gross focal neurologic deficits are appreciated.  Skin:  Skin is warm, dry and intact. No rash noted. Psychiatric: Mood and affect are normal. Speech and behavior are normal. Patient exhibits appropriate insight and judgment.  ____________________________________________    LABS (pertinent positives/negatives)  Hgb 7.2 Bmp pending ____________________________________________   EKG  None  ____________________________________________    RADIOLOGY  None   ____________________________________________   PROCEDURES  Procedure(s) performed: None  Critical Care performed: Yes, see critical care note(s)  CRITICAL CARE Performed by: Nance Pear   Total critical care time: 30 minutes  Critical care time was exclusive of separately billable procedures and treating other patients.  Critical care was necessary to treat or prevent imminent or life-threatening deterioration.  Critical care was time spent personally by me on the following activities: development of  treatment plan with patient and/or surrogate as well as nursing, discussions with consultants, evaluation of patient's response to treatment, examination of patient, obtaining history from patient or surrogate, ordering and performing treatments and interventions, ordering and review of laboratory studies, ordering and review of radiographic studies, pulse oximetry and re-evaluation of patient's condition.  ____________________________________________   INITIAL IMPRESSION / ASSESSMENT AND PLAN / ED COURSE  Pertinent labs & imaging results that were available during my care of the patient were reviewed by me and considered in my medical decision making (see chart for details).  Patient presented to the emergency department today he comes of concern for weakness. Per EMS patient was very hypotensive and continued to be hypotensive here in the emergency department. CBC was concerning for anemia. In light of anemia and hypotension will plan on giving transfusion of blood work. BMP was pending at time of admission to the hospital service.  ____________________________________________  FINAL CLINICAL IMPRESSION(S) / ED DIAGNOSES  Final diagnoses:  Anemia, unspecified anemia type  Weakness  Hypotension, unspecified hypotension type     Nance Pear, MD 03/29/15 1420

## 2015-03-28 NOTE — ED Notes (Signed)
Ed Blalock, [phone], Daughter, call to update on room number when admitted.

## 2015-03-28 NOTE — H&P (Signed)
[facility] [place] NAME: Anne [name]    MR#:  [id number]  DATE OF BIRTH:  1955/12/24   DATE OF ADMISSION:  03/28/2015  PRIMARY CARE PHYSICIAN: Freddy Finner, NP   REQUESTING/REFERRING PHYSICIAN: Archie Balboa  CHIEF COMPLAINT:   Chief Complaint  Patient presents with  . Hypotension    HISTORY OF PRESENT ILLNESS:  Anne [name]  is a 60 y.o. female with a known history of essential hypertension, hepatitis C who is presenting with weakness. She describes 2 day duration of nausea vomiting nonbloody nonbilious emesis with decreased oral intake as well as one day duration of diarrhea. However, today she was weak and had difficulty ambulating and felt lightheaded. Because of these symptoms decided to present to the Hospital further workup and evaluation. She was noted to be hypotensive on arrival which has improved after IV fluid hydration. Of note she has had a recent increase of her blood pressure medications on December 20 however she is unsure which medication she has increased. In the emergency department workup revealed anemia, fecal occult blood test negative given hypotension as well as anemia the emergent department staff decided to order a blood transfusion.  PAST MEDICAL HISTORY:   Past Medical History  Diagnosis Date  . Benign essential HTN 11/16/2014  . Heart valve disease 09/03/2013  . Breathlessness on exertion 09/03/2013  . Exposure to hepatitis B 12/10/2014  . History of cardiac catheterization 03/24/2014    Overview:  With normal coronaries 2015   . Hepatitis C   . CHF (congestive heart failure) ([place])   . COPD (chronic obstructive pulmonary disease) ([place])     PAST SURGICAL HISTORY:   Past Surgical History  Procedure Laterality Date  . Tubal ligation      SOCIAL HISTORY:   Social History  Substance Use Topics  . Smoking status: Never Smoker   . Smokeless tobacco: Never Used  . Alcohol Use: 0.6 oz/week    1 Cans of  beer per week     Comment: Occasional     FAMILY HISTORY:   Family History  Problem Relation Age of Onset  . Hypertension Mother   . Arthritis Mother     DRUG ALLERGIES:   Allergies  Allergen Reactions  . Ibuprofen Other (See Comments)    Reaction:  Unknown   . Nitrofuran Derivatives Other (See Comments)    Reaction:  Unknown     REVIEW OF SYSTEMS:  REVIEW OF SYSTEMS:  CONSTITUTIONAL: Denies fevers, chills, positive fatigue, weakness.  EYES: Denies blurred vision, double vision, or eye pain.  EARS, NOSE, THROAT: Denies tinnitus, ear pain, hearing loss.  RESPIRATORY: denies cough, shortness of breath, wheezing  CARDIOVASCULAR: Denies chest pain, palpitations, edema.  GASTROINTESTINAL: Positive nausea, vomiting, diarrhea, denies abdominal pain.  GENITOURINARY: Denies dysuria, hematuria.  ENDOCRINE: Denies nocturia or thyroid problems. HEMATOLOGIC AND LYMPHATIC: Denies easy bruising or bleeding.  SKIN: Denies rash or lesions.  MUSCULOSKELETAL: Denies pain in neck, back, shoulder, knees, hips, or further arthritic symptoms.  NEUROLOGIC: Denies paralysis, paresthesias.  PSYCHIATRIC: Denies anxiety or depressive symptoms. Otherwise full review of systems performed by me is negative.   MEDICATIONS AT HOME:   Prior to Admission medications   Medication Sig Start Date End Date Taking? Authorizing Provider  albuterol (PROVENTIL HFA;VENTOLIN HFA) 108 (90 Base) MCG/ACT inhaler Inhale 2 puffs into the lungs every 6 (six) hours as needed for wheezing or shortness of breath.   Yes Historical Provider, MD  carvedilol (COREG)  25 MG tablet Take 25 mg by mouth 2 (two) times daily with a meal.    Yes Historical Provider, MD  Dasab-Ombitas-Paritapre-Ritona (VIEKIRA XR) 200-8.33-50- 33.33 MG TB24 Take 1 tablet by mouth 2 (two) times daily.   Yes Historical Provider, MD  Fluticasone-Salmeterol (ADVAIR DISKUS) 250-50 MCG/DOSE AEPB Inhale 1 puff into the lungs 2 (two) times daily.    Yes  Historical Provider, MD  losartan (COZAAR) 50 MG tablet Take 100 mg by mouth daily.   Yes Historical Provider, MD  omeprazole (PRILOSEC) 20 MG capsule Take 20 mg by mouth daily.    Yes Historical Provider, MD  ribavirin (REBETOL) 200 MG capsule Take 600 mg by mouth 2 (two) times daily.    Yes Historical Provider, MD  spironolactone (ALDACTONE) 50 MG tablet Take 50 mg by mouth daily.   Yes Historical Provider, MD  tiotropium (SPIRIVA) 18 MCG inhalation capsule Place 18 mcg into inhaler and inhale daily.    Yes Historical Provider, MD      VITAL SIGNS:  Blood pressure 96/53, pulse 69, temperature 98.3 F (36.8 C), temperature source Rectal, resp. rate 15, height 5\' 3"  (1.6 m), weight 155 lb (70.308 kg), SpO2 100 %.  PHYSICAL EXAMINATION:  VITAL SIGNS: Filed Vitals:   03/28/15 2218 03/28/15 2245  BP: 111/62 96/53  Pulse: 54 69  Temp: 98.3 F (36.8 C)   Resp: 29 15   GENERAL:59 y.o.female currently in no acute distress.  HEAD: Normocephalic, atraumatic.  EYES: Pupils equal, round, reactive to light. Extraocular muscles intact. No scleral icterus.  MOUTH: Moist mucosal membrane. Dentition intact. No abscess noted.  EAR, NOSE, THROAT: Clear without exudates. No external lesions.  NECK: Supple. No thyromegaly. No nodules. No JVD.  PULMONARY: Clear to ascultation, without wheeze rails or rhonci. No use of accessory muscles, Good respiratory effort. good air entry bilaterally CHEST: Nontender to palpation.  CARDIOVASCULAR: S1 and S2. Regular rate and rhythm. No murmurs, rubs, or gallops. No edema. Pedal pulses 2+ bilaterally.  GASTROINTESTINAL: Soft, nontender, nondistended. No masses. Positive bowel sounds. No hepatosplenomegaly.  MUSCULOSKELETAL: No swelling, clubbing, or edema. Range of motion full in all extremities.  NEUROLOGIC: Cranial nerves II through XII are intact. No gross focal neurological deficits. Sensation intact. Reflexes intact.  SKIN: No ulceration, lesions, rashes, or  cyanosis. Skin warm and dry. Turgor intact.  PSYCHIATRIC: Mood, affect within normal limits. The patient is awake, alert and oriented x 3. Insight, judgment intact.    LABORATORY PANEL:   CBC  Recent Labs Lab 03/28/15 2045  WBC 8.0  HGB 7.2*  HCT 22.0*  PLT 218   ------------------------------------------------------------------------------------------------------------------  Chemistries   Recent Labs Lab 03/28/15 2045  NA 137  K 7.3*  CL 113*  CO2 19*  GLUCOSE 123*  BUN 40*  CREATININE 3.50*  CALCIUM 9.3  AST 26  ALT 48  ALKPHOS 56  BILITOT 1.2   ------------------------------------------------------------------------------------------------------------------  Cardiac Enzymes No results for input(s): TROPONINI in the last 168 hours. ------------------------------------------------------------------------------------------------------------------  RADIOLOGY:  No results found.  EKG:   Orders placed or performed during the hospital encounter of 03/28/15  . EKG 12-Lead  . EKG 12-Lead    IMPRESSION AND PLAN:   61 year old African-American female history of hepatitis C who is presenting with weakness and be hypotensive  1. Acute renal failure: Likely multifactorial including decreased oral intake, vomiting diarrhea, increased blood pressure medications. IV fluid hydration following urine output/renal function 2. Hyperkalemia: Likely in relation to multiple medications with side effects causing hyperkalemia 5 order  her to receive calcium, Kayexalate, insulin, dextrose follow potassium levels every 6 hoursuntil downward trend 3. Anemia, normocytic: This is likely relation to the ribavirin she is taking since November 2016 she has no active bleeding she has been ordered to receive blood in the emergency department 4. Essential hypertension: Given she is hypotensive. Hold further medications for now 5. Venous thromboembolism prophylactic: Heparin  subcutaneous    All the records are reviewed and case discussed with ED provider. Management plans discussed with the patient, family and they are in agreement.  CODE STATUS: Full  TOTAL TIME TAKING CARE OF THIS PATIENT: 45 minutes.    Hower,  Karenann Cai.D on 03/28/2015 at 11:32 PM  Between 7am to 6pm - Pager - [phone]  After 6pm: House Pager: [phone]  [facility]  Office  [phone]  CC: Primary care physician; Freddy Finner, NP

## 2015-03-29 LAB — BASIC METABOLIC PANEL WITH GFR
Anion gap: 4 — ABNORMAL LOW (ref 5–15)
BUN: 27 mg/dL — ABNORMAL HIGH (ref 6–20)
CO2: 18 mmol/L — ABNORMAL LOW (ref 22–32)
Calcium: 8.4 mg/dL — ABNORMAL LOW (ref 8.9–10.3)
Chloride: 116 mmol/L — ABNORMAL HIGH (ref 101–111)
Creatinine, Ser: 2.11 mg/dL — ABNORMAL HIGH (ref 0.44–1.00)
GFR calc Af Amer: 29 mL/min — ABNORMAL LOW
GFR calc non Af Amer: 25 mL/min — ABNORMAL LOW
Glucose, Bld: 88 mg/dL (ref 65–99)
Potassium: 6.5 mmol/L — ABNORMAL HIGH (ref 3.5–5.1)
Sodium: 138 mmol/L (ref 135–145)

## 2015-03-29 LAB — CBC
HEMATOCRIT: 29.7 % — AB (ref 35.0–47.0)
Hemoglobin: 9.8 g/dL — ABNORMAL LOW (ref 12.0–16.0)
MCH: 31.8 pg (ref 26.0–34.0)
MCHC: 33.1 g/dL (ref 32.0–36.0)
MCV: 96.2 fL (ref 80.0–100.0)
PLATELETS: 188 10*3/uL (ref 150–440)
RBC: 3.09 MIL/uL — AB (ref 3.80–5.20)
RDW: 14.4 % (ref 11.5–14.5)
WBC: 9.2 10*3/uL (ref 3.6–11.0)

## 2015-03-29 LAB — BASIC METABOLIC PANEL
ANION GAP: 4 — AB (ref 5–15)
BUN: 31 mg/dL — ABNORMAL HIGH (ref 6–20)
CHLORIDE: 120 mmol/L — AB (ref 101–111)
CO2: 16 mmol/L — ABNORMAL LOW (ref 22–32)
Calcium: 9 mg/dL (ref 8.9–10.3)
Creatinine, Ser: 2.56 mg/dL — ABNORMAL HIGH (ref 0.44–1.00)
GFR, EST AFRICAN AMERICAN: 23 mL/min — AB (ref >60)
GFR, EST NON AFRICAN AMERICAN: 20 mL/min — AB (ref >60)
Glucose, Bld: 84 mg/dL (ref 65–99)
POTASSIUM: 6.5 mmol/L — AB (ref 3.5–5.1)
SODIUM: 140 mmol/L (ref 135–145)

## 2015-03-29 LAB — LACTIC ACID, PLASMA: LACTIC ACID, VENOUS: 0.9 mmol/L (ref 0.5–2.0)

## 2015-03-29 MED ORDER — MOMETASONE FURO-FORMOTEROL FUM 100-5 MCG/ACT IN AERO
2.0000 | INHALATION_SPRAY | Freq: Two times a day (BID) | RESPIRATORY_TRACT | Status: DC
  Administered 2015-03-29 – 2015-03-31 (×5): 2 via RESPIRATORY_TRACT
  Filled 2015-03-29 (×2): qty 8.8

## 2015-03-29 MED ORDER — DEXTROSE 50 % IV SOLN
25.0000 mL | Freq: Once | INTRAVENOUS | Status: AC
  Administered 2015-03-29: 25 mL via INTRAVENOUS
  Filled 2015-03-29: qty 50

## 2015-03-29 MED ORDER — ALBUTEROL SULFATE (2.5 MG/3ML) 0.083% IN NEBU: 3.0000 mL | INHALATION_SOLUTION | Freq: Four times a day (QID) | RESPIRATORY_TRACT | Status: DC | PRN

## 2015-03-29 MED ORDER — TIOTROPIUM BROMIDE MONOHYDRATE 18 MCG IN CAPS
18.0000 ug | ORAL_CAPSULE | Freq: Every day | RESPIRATORY_TRACT | Status: DC
  Administered 2015-03-29 – 2015-03-31 (×3): 18 ug via RESPIRATORY_TRACT
  Filled 2015-03-29: qty 5

## 2015-03-29 MED ORDER — INSULIN ASPART 100 UNIT/ML IV SOLN
10.0000 [IU] | Freq: Once | INTRAVENOUS | Status: AC
  Administered 2015-03-29: 10 [IU] via INTRAVENOUS
  Filled 2015-03-29: qty 0.1

## 2015-03-29 MED ORDER — RIBAVIRIN 200 MG PO CAPS: 600.0000 mg | ORAL_CAPSULE | Freq: Two times a day (BID) | ORAL | Status: DC

## 2015-03-29 MED ORDER — PANTOPRAZOLE SODIUM 40 MG PO TBEC
40.0000 mg | DELAYED_RELEASE_TABLET | Freq: Every day | ORAL | Status: DC
  Administered 2015-03-29 – 2015-03-31 (×3): 40 mg via ORAL
  Filled 2015-03-29 (×3): qty 1

## 2015-03-29 MED ORDER — SODIUM POLYSTYRENE SULFONATE 15 GM/60ML PO SUSP
30.0000 g | Freq: Once | ORAL | Status: AC
  Administered 2015-03-29: 30 g via ORAL
  Filled 2015-03-29: qty 120

## 2015-03-29 MED ORDER — DASAB-OMBITAS-PARITAPRE-RITONA 200-8.33-50- 33.33 MG PO TB24: 1.0000 | ORAL_TABLET | Freq: Two times a day (BID) | ORAL | Status: DC

## 2015-03-29 MED ORDER — PNEUMOCOCCAL VAC POLYVALENT 25 MCG/0.5ML IJ INJ: 0.5000 mL | INJECTION | INTRAMUSCULAR | Status: DC

## 2015-03-29 MED ORDER — SODIUM CHLORIDE 0.9 % IV SOLN
1.0000 g | Freq: Once | INTRAVENOUS | Status: AC
  Administered 2015-03-29: 1 g via INTRAVENOUS
  Filled 2015-03-29: qty 10

## 2015-03-29 NOTE — Progress Notes (Signed)
Pt arrived to [facility] from ED this morning.  Alert and oriented. No complaints voiced. VSS at this time. BP improved currently at 103/65. Pt states, " I feel so much better than last night." 2nd unit of blood currently infusing at this time. Lab notified about pt currently receiving blood to reschedule lab work to be done around 8am.  Pt ambulating to and from bathroom with assistance with difficulty. NSR on tele.  Spoke with Nate in pharmacy in reference to pt's ribavirin medication and he stated that the med should not be given at this time d/t low hg levels. Will cont to monitor pt.

## 2015-03-29 NOTE — Progress Notes (Signed)
Initial Nutrition Assessment     INTERVENTION:  Meals and snacks: Cater to pt preferences Medical Nutrition Supplement Therapy: If unable to meet nutritional needs will add supplement   NUTRITION DIAGNOSIS:   Inadequate oral intake related to acute illness as evidenced by per patient/family report.    GOAL:   Patient will meet greater than or equal to 90% of their needs    MONITOR:    (Energy intake, Electrolyte and renal profile)  REASON FOR ASSESSMENT:   Diagnosis    ASSESSMENT:      Pt admitted with hypotension, weakness, anemia, ARF, hyperkalemia  Past Medical History  Diagnosis Date  . Benign essential HTN 11/16/2014  . Heart valve disease 09/03/2013  . Breathlessness on exertion 09/03/2013  . Exposure to hepatitis B 12/10/2014  . History of cardiac catheterization 03/24/2014    Overview:  With normal coronaries 2015   . Hepatitis C   . CHF (congestive heart failure) ([place])   . COPD (chronic obstructive pulmonary disease) ([facility])     Current Nutrition: eating better per pt this am, ate most of breakfast  Food/Nutrition-Related History: Pt reports eating mostly cereal, jello, fruit prior to admission for about 3-4 days as not feeling well   Scheduled Medications:  . Dasab-Ombitas-Paritapre-Ritona  1 tablet Oral BID  . heparin  5,000 Units Subcutaneous 3 times per day  . mometasone-formoterol  2 puff Inhalation BID  . pantoprazole  40 mg Oral QAC breakfast  . [START ON 03/30/2015] pneumococcal 23 valent vaccine  0.5 mL Intramuscular Tomorrow-1000  . ribavirin  600 mg Oral BID  . sodium chloride  3 mL Intravenous Q12H  . tiotropium  18 mcg Inhalation Daily    Continuous Medications:  . sodium chloride 125 mL/hr at 03/29/15 0641     Electrolyte/Renal Profile and Glucose Profile:   Recent Labs Lab 03/28/15 2045 03/29/15 0754  NA 137 140  K 7.3* 6.5*  CL 113* 120*  CO2 19* 16*  BUN 40* 31*  CREATININE 3.50* 2.56*  CALCIUM 9.3 9.0  GLUCOSE 123*  84   Protein Profile:  Recent Labs Lab 03/28/15 2045  ALBUMIN 3.2*    Gastrointestinal Profile: Last BM:1/3   Nutrition-Focused Physical Exam Findings:  Unable to complete Nutrition-Focused physical exam at this time.     Weight Change: pt reports stable wt prior to admission but noted per wt encounter 8% wt loss in the last 3 months    Diet Order:  Diet Heart Room service appropriate?: Yes; Fluid consistency:: Thin  Skin:   reviewed     Height:   Ht Readings from Last 1 Encounters:  03/29/15 5\' 3"  (1.6 m)    Weight:   Wt Readings from Last 1 Encounters:  03/29/15 160 lb 8 oz (72.802 kg)    Ideal Body Weight:     BMI:  Body mass index is 28.44 kg/(m^2).  Estimated Nutritional Needs:   Kcal:  BEE 1069 kcals (IF 1.0-1.3, AF 1.3) [id number] kcals/d. Using IBW of 52kg  Protein:  (1.0-1.2 g/kg) 52-62 g/d  Fluid:  (25-30ml/kg) 1300-1553ml/d  EDUCATION NEEDS:   No education needs identified at this time  [place]. Zenia Resides, [place], [place] (pager) Weekend/On-Call pager [phone])

## 2015-03-29 NOTE — Progress Notes (Signed)
[name] [place] NAME: Anne [name]    MR#:  [id number]  DATE OF BIRTH:  02/07/1956  SUBJECTIVE:59 yr old female with h/o htn,HEPB admitted for nausea,vomting,diarhoea.found to have  Hypotension/acute renal failure/severe hyperkalemia. Today she feels better but still has hyperkalemia.nausea,vomting resolved.renal  Function is better.had acute on chronic anemia on admission s/p one unit PRBC transfusion,  CHIEF COMPLAINT:   Chief Complaint  Patient presents with  . Hypotension    REVIEW OF SYSTEMS:   ROS CONSTITUTIONAL: No fever, fatigue or weakness.  EYES: No blurred or double vision.  EARS, NOSE, AND THROAT: No tinnitus or ear pain.  RESPIRATORY: No cough, shortness of breath, wheezing or hemoptysis.  CARDIOVASCULAR: No chest pain, orthopnea, edema.  GASTROINTESTINAL: No nausea, vomiting, diarrhea or abdominal pain.  GENITOURINARY: No dysuria, hematuria.  ENDOCRINE: No polyuria, nocturia,  HEMATOLOGY: No anemia, easy bruising or bleeding SKIN: No rash or lesion. MUSCULOSKELETAL: No joint pain or arthritis.   NEUROLOGIC: No tingling, numbness, weakness.  PSYCHIATRY: No anxiety or depression.   DRUG ALLERGIES:   Allergies  Allergen Reactions  . Ibuprofen Other (See Comments)    Reaction:  Unknown   . Nitrofuran Derivatives Other (See Comments)    Reaction:  Unknown     VITALS:  Blood pressure 104/78, pulse 60, temperature 98.4 F (36.9 C), temperature source Oral, resp. rate 16, height 5\' 3"  (1.6 m), weight 72.802 kg (160 lb 8 oz), SpO2 100 %.  PHYSICAL EXAMINATION:  GENERAL:  60 y.o.-year-old patient lying in the bed with no acute distress.  EYES: Pupils equal, round, reactive to light and accommodation. No scleral icterus. Extraocular muscles intact.  HEENT: Head atraumatic, normocephalic. Oropharynx and nasopharynx clear.  NECK:  Supple, no jugular venous distention. No thyroid enlargement, no tenderness.   LUNGS: Normal breath sounds bilaterally, no wheezing, rales,rhonchi or crepitation. No use of accessory muscles of respiration.  CARDIOVASCULAR: S1, S2 normal. No murmurs, rubs, or gallops.  ABDOMEN: Soft, nontender, nondistended. Bowel sounds present. No organomegaly or mass.  EXTREMITIES: No pedal edema, cyanosis, or clubbing.  NEUROLOGIC: Cranial nerves II through XII are intact. Muscle strength 5/5 in all extremities. Sensation intact. Gait not checked.  PSYCHIATRIC: The patient is alert and oriented x 3.  SKIN: No obvious rash, lesion, or ulcer.    LABORATORY PANEL:   CBC  Recent Labs Lab 03/29/15 0754  WBC 9.2  HGB 9.8*  HCT 29.7*  PLT 188   ------------------------------------------------------------------------------------------------------------------  Chemistries   Recent Labs Lab 03/28/15 2045  03/29/15 1348  NA 137  < > 138  K 7.3*  < > 6.5*  CL 113*  < > 116*  CO2 19*  < > 18*  GLUCOSE 123*  < > 88  BUN 40*  < > 27*  CREATININE 3.50*  < > 2.11*  CALCIUM 9.3  < > 8.4*  AST 26  --   --   ALT 48  --   --   ALKPHOS 56  --   --   BILITOT 1.2  --   --   < > = values in this interval not displayed. ------------------------------------------------------------------------------------------------------------------  Cardiac Enzymes No results for input(s): TROPONINI in the last 168 hours. ------------------------------------------------------------------------------------------------------------------  RADIOLOGY:  No results found.  EKG:   Orders placed or performed during the hospital encounter of 03/28/15  . EKG 12-Lead  . EKG 12-Lead    ASSESSMENT AND PLAN:  1.ARF  Due to ATN:due to nausea.vomtinmg/diarrhoea;improvinf with fluids;continue  IV fluids for furter improvement in renal function.avoid nephrotoxic agents. 2.severe   hyperkaemia ,likley related to RIbavirin;d/c the drug. 3.htn ;now hypotensive;hold her coreg,spironolactone and  cozzaar. 4.nausea,vomtimng;resolved; 5.hepb ans HEpc;hold ribavirin now due to  Severe hyperkalemia 6.copd;stable;no wheezing; 7.h/o valvular heart disease;follows up with [facility]   All the  records are reviewed and case discussed with Care Management/Social Workerr. Management plans discussed with the patient, family and they are in agreement.  CODE STATUS: full  TOTAL TIME TAKING CARE OF THIS PATIENT: 35 minutes.   POSSIBLE D/C IN 1-2DAYS, DEPENDING ON CLINICAL CONDITION.   Anne [name] M.D on 03/29/2015 at 4:28 PM  Between 7am to 6pm - Pager - [phone]  After 6pm go to [email or web address] - password [email or web address]  [facility]  Office  [phone]  CC: Primary care physician; Anne Finner, NP   Note: This dictation was prepared with Dragon dictation along with smaller phrase technology. Any transcriptional errors that result from this process are unintentional.

## 2015-03-30 LAB — BASIC METABOLIC PANEL
Anion gap: 5 (ref 5–15)
Anion gap: 3 — ABNORMAL LOW (ref 5–15)
BUN: 20 mg/dL (ref 6–20)
BUN: 18 mg/dL (ref 6–20)
CHLORIDE: 118 mmol/L — AB (ref 101–111)
CHLORIDE: 117 mmol/L — AB (ref 101–111)
CO2: 17 mmol/L — AB (ref 22–32)
CO2: 19 mmol/L — AB (ref 22–32)
CREATININE: 1.62 mg/dL — AB (ref 0.44–1.00)
CREATININE: 1.78 mg/dL — AB (ref 0.44–1.00)
Calcium: 8.1 mg/dL — ABNORMAL LOW (ref 8.9–10.3)
Calcium: 8.3 mg/dL — ABNORMAL LOW (ref 8.9–10.3)
GFR calc Af Amer: 39 mL/min — ABNORMAL LOW (ref >60)
GFR calc Af Amer: 35 mL/min — ABNORMAL LOW (ref >60)
GFR calc non Af Amer: 34 mL/min — ABNORMAL LOW (ref >60)
GFR calc non Af Amer: 30 mL/min — ABNORMAL LOW (ref >60)
GLUCOSE: 89 mg/dL (ref 65–99)
Glucose, Bld: 113 mg/dL — ABNORMAL HIGH (ref 65–99)
Potassium: 5.6 mmol/L — ABNORMAL HIGH (ref 3.5–5.1)
Potassium: 5.8 mmol/L — ABNORMAL HIGH (ref 3.5–5.1)
Sodium: 140 mmol/L (ref 135–145)
Sodium: 139 mmol/L (ref 135–145)

## 2015-03-30 LAB — TYPE AND SCREEN
ABO/RH(D): O POS
Antibody Screen: NEGATIVE
UNIT DIVISION: 0
Unit division: 0

## 2015-03-30 LAB — POTASSIUM: Potassium: 4.7 mmol/L (ref 3.5–5.1)

## 2015-03-30 MED ORDER — SODIUM POLYSTYRENE SULFONATE 15 GM/60ML PO SUSP
30.0000 g | Freq: Once | ORAL | Status: AC
  Administered 2015-03-30: 30 g via ORAL
  Filled 2015-03-30: qty 120

## 2015-03-30 NOTE — Discharge Summary (Signed)
Anne [name], is a 60 y.o. female  DOB 27-Jan-1956  MRN [id number].  Admission date:  03/28/2015  Admitting Physician  Lytle Butte, MD  Discharge Date:  03/30/2015   Primary MD  Freddy Finner, NP  Recommendations for primary care physician for things to follow:   Follow-up with Dr. Ollen Bowl in one week Follow up with Dr. Nehemiah Massed in 10 days.   Admission Diagnosis  Weakness [R53.1] Hypotension, unspecified hypotension type [I95.9] Anemia, unspecified anemia type [D64.9]   Discharge Diagnosis  Weakness [R53.1] Hypotension, unspecified hypotension type [I95.9] Anemia, unspecified anemia type [D64.9]    Active Problems:   Acute renal failure (ARF) (HCC)      Past Medical History  Diagnosis Date  . Benign essential HTN 11/16/2014  . Heart valve disease 09/03/2013  . Breathlessness on exertion 09/03/2013  . Exposure to hepatitis B 12/10/2014  . History of cardiac catheterization 03/24/2014    Overview:  With normal coronaries 2015   . Hepatitis C   . CHF (congestive heart failure) ([place])   . COPD (chronic obstructive pulmonary disease) [facility])     Past Surgical History  Procedure Laterality Date  . Tubal ligation         History of present illness and  Hospital Course:     Kindly see H&P for history of present illness and admission details, please review complete Labs, Consult reports and Test reports for all details in brief  HPI  from the history and physical done on the day of admission  60 year old female patient with history of hypertension, hepatitis B recently started on Ribavirin comes in because of nausea, vomiting, diarrhea. And also had lightheadedness. Found to have hypotension. Admitted to hospitalist service for acute renal failure with  hyperkalemia, hypotension  Hospital Course  1 intractable  nausea, vomiting secondary to severe hyperkalemia with potassium of 0.3 on admission. Patient received potassium shifting measures including calcium gluconate, Kayoxelate, insulin with dextrose. Stopped Losartan ,ribavirin, and also received IV hydration. Potassium improved to 5.6 today. Patient feels much better now.she also noted that she feels very weak and tired and nauseous since she started on the medication for hepatitis C. #2 acute renal failure secondary to hypotension causing ATN: Improved with IV hydration, stopping her losartan, HCTZ. Advised the patient to get a BP monitor at home, recheck her kidney function on Friday with primary doctor to see if it's better than today probably can restart head CT Z, restart losartan after few days to decrease serum K #3, hypotension causing ATN:*Spell had a Coreg, HCTZ, losartan. Patient has history of  chronic diastolic heart failure. Follows up with Dr. Nehemiah Massed. #4 hepatitis C Carr. Patient's follows up with Dr. Ollen Bowl. Recently started on Viekira XRand ribavirin. The doctor wall and the latest note that patient admitted here for severe hyperkalemia secondary to a losartan, ribavirin.hold ribavirin and Linzie Collin till she sees Dr.Darren Allen Norris.  5. If repeat potassium is better than 5.6 patient can go home. Advised to continue to hold the head CT Z, losartan, have the kidney function checked by primary doctor on Friday before restarting the medications. Patient advised to Get a BP monitor also. \  Discharge Condition: stable   Follow UP Follow up with Dr.GI in one week    Discharge Instructions  and  Discharge Medications       Medication List    ASK your doctor about these medications        ADVAIR DISKUS 250-50 MCG/DOSE Aepb  Generic  drug:  Fluticasone-Salmeterol  Inhale 1 puff into the lungs 2 (two) times daily.     albuterol 108 (90 Base) MCG/ACT inhaler  Commonly known as:  PROVENTIL HFA;VENTOLIN HFA  Inhale 2 puffs into the lungs  every 6 (six) hours as needed for wheezing or shortness of breath.     carvedilol 25 MG tablet  Commonly known as:  COREG  Take 25 mg by mouth 2 (two) times daily with a meal.     losartan 50 MG tablet  Commonly known as:  COZAAR  Take 100 mg by mouth daily.     omeprazole 20 MG capsule  Commonly known as:  PRILOSEC  Take 20 mg by mouth daily.     ribavirin 200 MG capsule  Commonly known as:  REBETOL  Take 600 mg by mouth 2 (two) times daily.     spironolactone 50 MG tablet  Commonly known as:  ALDACTONE  Take 50 mg by mouth daily.     tiotropium 18 MCG inhalation capsule  Commonly known as:  SPIRIVA  Place 18 mcg into inhaler and inhale daily.     VIEKIRA XR 200-8.33-50- 33.33 MG Tb24  Generic drug:  Dasab-Ombitas-Paritapre-Ritona  Take 1 tablet by mouth 2 (two) times daily.          Diet and Activity recommendation: See Discharge Instructions above   Consults obtained - none   Major procedures and Radiology Reports - PLEASE review detailed and final reports for all details, in brief -      No results found.  Micro Results     No results found for this or any previous visit (from the past 240 hour(s)).     Today   Subjective:   Anne [name] today has no headache,no chest abdominal pain,no new weakness tingling or numbness, feels much better wants to go home today.  Objective:   Blood pressure 90/58, pulse 70, temperature 98.3 F (36.8 C), temperature source Oral, resp. rate 16, height 5\' 3"  (1.6 m), weight 72.802 kg (160 lb 8 oz), SpO2 98 %.   Intake/Output Summary (Last 24 hours) at 03/30/15 1153 Last data filed at 03/30/15 1035  Gross per 24 hour  Intake   3417 ml  Output   1300 ml  Net   2117 ml    Exam Awake Alert, Oriented x 3, No new F.N deficits, Normal affect [place].AT,PERRAL Supple Neck,No JVD, No cervical lymphadenopathy appriciated.  Symmetrical Chest wall movement, Good air movement bilaterally, CTAB RRR,No Gallops,Rubs or new  Murmurs, No Parasternal Heave +ve B.Sounds, Abd Soft, Non tender, No organomegaly appriciated, No rebound -guarding or rigidity. No Cyanosis, Clubbing or edema, No new Rash or bruise  Data Review   CBC w Diff: Lab Results  Component Value Date   WBC 9.2 03/29/2015   WBC 18.8* 06/09/2011   HGB 9.8* 03/29/2015   HGB 12.7 06/09/2011   HCT 29.7* 03/29/2015   HCT 38.3 06/09/2011   PLT 188 03/29/2015   PLT 255 06/09/2011   LYMPHOPCT 19 03/28/2015   LYMPHOPCT 19.6 06/09/2011   MONOPCT 10 03/28/2015   MONOPCT 8.2 06/09/2011   EOSPCT 1 03/28/2015   EOSPCT 0.1 06/09/2011   BASOPCT 0 03/28/2015   BASOPCT 0.3 06/09/2011    CMP: Lab Results  Component Value Date   NA 140 03/30/2015   NA 138 06/07/2011   K 5.6* 03/30/2015   K 3.9 06/07/2011   CL 118* 03/30/2015   CL 102 06/07/2011   CO2 17* 03/30/2015  CO2 23 06/07/2011   BUN 20 03/30/2015   BUN 13 06/07/2011   CREATININE 1.62* 03/30/2015   CREATININE 1.24 06/07/2011   PROT 6.7 03/28/2015   PROT 9.0* 06/06/2011   ALBUMIN 3.2* 03/28/2015   ALBUMIN 3.3* 06/06/2011   BILITOT 1.2 03/28/2015   BILITOT 0.6 06/06/2011   ALKPHOS 56 03/28/2015   ALKPHOS 97 06/06/2011   AST 26 03/28/2015   AST 47* 06/06/2011   ALT 48 03/28/2015   ALT 59 06/06/2011  .   Total Time in preparing paper work, data evaluation and todays exam - 54 minutes  Anne [name] M.D on 03/30/2015 at 11:53 AM    Note: This dictation was prepared with Dragon dictation along with smaller phrase technology. Any transcriptional errors that result from this process are unintentional.

## 2015-03-30 NOTE — Progress Notes (Signed)
Dr. Marcille Blanco notified of K+ level 5.6 which is improved from yesterday 6.5. Asked if MD would like to treat elevated K+. No new orders noted.

## 2015-03-30 NOTE — Telephone Encounter (Signed)
Pt's daughter called today asking about pt's Hep C medication. She was advised that she needed to get better before we restart any medication for Hep C. Please advise me when she needs to follow up with [place]. Daughter said her potassium has improved.

## 2015-03-31 NOTE — Telephone Encounter (Signed)
Pt has been scheduled for a follow up appt on 04/07/15 to discuss treatment.

## 2015-03-31 NOTE — Discharge Summary (Signed)
Anne [name], is a 60 y.o. female  DOB 25-Dec-1955  MRN [id number].  Admission date:  03/28/2015  Admitting Physician  Lytle Butte, MD  Discharge Date:  03/31/2015   Primary MD  Freddy Finner, NP  Recommendations for primary care physician for things to follow:   Follow-up with Dr. Ollen Bowl in one week Follow up with Dr. Nehemiah Massed in 10 days.   Admission Diagnosis  Weakness [R53.1] Hypotension, unspecified hypotension type [I95.9] Anemia, unspecified anemia type [D64.9]   Discharge Diagnosis  Weakness [R53.1] Hypotension, unspecified hypotension type [I95.9] Anemia, unspecified anemia type [D64.9]    Active Problems:   Acute renal failure (ARF) (HCC)      Past Medical History  Diagnosis Date  . Benign essential HTN 11/16/2014  . Heart valve disease 09/03/2013  . Breathlessness on exertion 09/03/2013  . Exposure to hepatitis B 12/10/2014  . History of cardiac catheterization 03/24/2014    Overview:  With normal coronaries 2015   . Hepatitis C   . CHF (congestive heart failure) ([place])   . COPD (chronic obstructive pulmonary disease) [facility])     Past Surgical History  Procedure Laterality Date  . Tubal ligation         History of present illness and  Hospital Course:     Kindly see H&P for history of present illness and admission details, please review complete Labs, Consult reports and Test reports for all details in brief  HPI  from the history and physical done on the day of admission  60 year old female patient with history of hypertension, hepatitis B recently started on Ribavirin comes in because of nausea, vomiting, diarrhea. And also had lightheadedness. Found to have hypotension. Admitted to hospitalist service for acute renal failure with  hyperkalemia, hypotension  Hospital Course  1 intractable  nausea, vomiting secondary to severe hyperkalemia with potassium of 0.3 on admission. Patient received potassium shifting measures including calcium gluconate, Kayoxelate, insulin with dextrose. Stopped Losartan ,ribavirin, and also received IV hydration. Potassium improved to 5.6 today. Patient feels much better now.she also noted that she feels very weak and tired and nauseous since she started on the medication for hepatitis C. #2 acute renal failure secondary to hypotension causing ATN: Improved with IV hydration, stopping her losartan, HCTZ. Advised the patient to get a BP monitor at home, recheck her kidney function on Friday with primary doctor to see if it's better than today probably can restart hctz/, restart losartan after few days to decrease serum K #3, hypotension causing ATN:* we held  Coreg, HCTZ, losartan. Patient has history of  chronic diastolic heart failure. Follows up with Dr. Nehemiah Massed. #4 hepatitis C Carr. Patient's follows up with Dr. Ollen Bowl. Recently started on Viekira XRand ribavirin. The doctor wall and the latest note that patient admitted here for severe hyperkalemia secondary to a losartan, ribavirin.hold ribavirin and Linzie Collin till she sees Dr.Darren Allen Norris.  5. hyperkalemia improved.rpt k is 4.7.. Advised to continue to hold the hctz, losartan, have the kidney function checked by primary doctor on Friday before restarting the medications. Patient advised to Get a BP monitor also. \  Discharge Condition: stable   Follow UP Follow up with Dr.GI in one week     Follow-up Information    Follow up with Ollen Bowl, MD In 1 week.   Specialty:  Gastroenterology   Contact information:   [address] [place] [address] [phone]         Discharge Instructions  and  Discharge Medications       Medication List    STOP taking these medications        carvedilol 25 MG tablet  Commonly known as:  COREG     losartan 50 MG tablet  Commonly known  as:  COZAAR     ribavirin 200 MG capsule  Commonly known as:  REBETOL     spironolactone 50 MG tablet  Commonly known as:  ALDACTONE     VIEKIRA XR 200-8.33-50- 33.33 MG Tb24  Generic drug:  Dasab-Ombitas-Paritapre-Ritona      TAKE these medications        ADVAIR DISKUS 250-50 MCG/DOSE Aepb  Generic drug:  Fluticasone-Salmeterol  Inhale 1 puff into the lungs 2 (two) times daily.     albuterol 108 (90 Base) MCG/ACT inhaler  Commonly known as:  PROVENTIL HFA;VENTOLIN HFA  Inhale 2 puffs into the lungs every 6 (six) hours as needed for wheezing or shortness of breath.     omeprazole 20 MG capsule  Commonly known as:  PRILOSEC  Take 20 mg by mouth daily.     tiotropium 18 MCG inhalation capsule  Commonly known as:  SPIRIVA  Place 18 mcg into inhaler and inhale daily.          Diet and Activity recommendation: See Discharge Instructions above   Consults obtained - none   Major procedures and Radiology Reports - PLEASE review detailed and final reports for all details, in brief -      No results found.  Micro Results     No results found for this or any previous visit (from the past 240 hour(s)).     Today   Subjective:   Anne [name] today has no headache,no chest abdominal pain,no new weakness tingling or numbness, feels much better wants to go home today.  Objective:   Blood pressure 98/53, pulse 90, temperature 98.5 F (36.9 C), temperature source Oral, resp. rate 16, height 5\' 3"  (1.6 m), weight 72.802 kg (160 lb 8 oz), SpO2 97 %.   Intake/Output Summary (Last 24 hours) at 03/31/15 0739 Last data filed at 03/31/15 0456  Gross per 24 hour  Intake 2148.54 ml  Output   1050 ml  Net 1098.54 ml    Exam Awake Alert, Oriented x 3, No new F.N deficits, Normal affect [place].AT,PERRAL Supple Neck,No JVD, No cervical lymphadenopathy appriciated.  Symmetrical Chest wall movement, Good air movement bilaterally, CTAB RRR,No Gallops,Rubs or new Murmurs, No  Parasternal Heave +ve B.Sounds, Abd Soft, Non tender, No organomegaly appriciated, No rebound -guarding or rigidity. No Cyanosis, Clubbing or edema, No new Rash or bruise  Data Review   CBC w Diff:  Lab Results  Component Value Date   WBC 9.2 03/29/2015   WBC 18.8* 06/09/2011   HGB 9.8* 03/29/2015   HGB 12.7 06/09/2011   HCT 29.7* 03/29/2015   HCT 38.3 06/09/2011   PLT 188 03/29/2015   PLT 255 06/09/2011   LYMPHOPCT 19 03/28/2015   LYMPHOPCT 19.6 06/09/2011   MONOPCT 10 03/28/2015   MONOPCT 8.2 06/09/2011   EOSPCT 1 03/28/2015   EOSPCT 0.1 06/09/2011   BASOPCT 0 03/28/2015   BASOPCT 0.3 06/09/2011    CMP:  Lab Results  Component Value Date   NA 139 03/30/2015   NA 138 06/07/2011   K 4.7 03/30/2015   K 3.9 06/07/2011   CL 117* 03/30/2015   CL 102 06/07/2011   CO2 19* 03/30/2015   CO2 23 06/07/2011   BUN 18  03/30/2015   BUN 13 06/07/2011   CREATININE 1.78* 03/30/2015   CREATININE 1.24 06/07/2011   PROT 6.7 03/28/2015   PROT 9.0* 06/06/2011   ALBUMIN 3.2* 03/28/2015   ALBUMIN 3.3* 06/06/2011   BILITOT 1.2 03/28/2015   BILITOT 0.6 06/06/2011   ALKPHOS 56 03/28/2015   ALKPHOS 97 06/06/2011   AST 26 03/28/2015   AST 47* 06/06/2011   ALT 48 03/28/2015   ALT 59 06/06/2011  .   Total Time in preparing paper work, data evaluation and todays exam - 31 minutes  Timmy Cleverly M.D on 03/31/2015 at 7:39 AM    Note: This dictation was prepared with Dragon dictation along with smaller phrase technology. Any transcriptional errors that result from this process are unintentional.

## 2015-03-31 NOTE — Progress Notes (Signed)
[place] [facility] [place] NAME: Anne [name]    MR#:  [id number]  DATE OF BIRTH:  [birth year]  SUBJECTIVE:59 yr old female with h/o htn,HEPB admitted for nausea,vomting,diarhoea.found to have  Hypotension/acute renal failure/severe hyperkalemia. Not discharged today due to persistent hyperkalemia,k is still 5.8.pt feels better.  CHIEF COMPLAINT:   Chief Complaint  Patient presents with  . Hypotension    REVIEW OF SYSTEMS:   ROS CONSTITUTIONAL: No fever, fatigue or weakness.  EYES: No blurred or double vision.  EARS, NOSE, AND THROAT: No tinnitus or ear pain.  RESPIRATORY: No cough, shortness of breath, wheezing or hemoptysis.  CARDIOVASCULAR: No chest pain, orthopnea, edema.  GASTROINTESTINAL: No nausea, vomiting, diarrhea or abdominal pain.  GENITOURINARY: No dysuria, hematuria.  ENDOCRINE: No polyuria, nocturia,  HEMATOLOGY: No anemia, easy bruising or bleeding SKIN: No rash or lesion. MUSCULOSKELETAL: No joint pain or arthritis.   NEUROLOGIC: No tingling, numbness, weakness.  PSYCHIATRY: No anxiety or depression.   DRUG ALLERGIES:   Allergies  Allergen Reactions  . Ibuprofen Other (See Comments)    Reaction:  Unknown   . Nitrofuran Derivatives Other (See Comments)    Reaction:  Unknown     VITALS:  Blood pressure 98/53, pulse 90, temperature 98.5 F (36.9 C), temperature source Oral, resp. rate 16, height 5\' 3"  (1.6 m), weight 72.802 kg (160 lb 8 oz), SpO2 97 %.  PHYSICAL EXAMINATION:  GENERAL:  60 y.o.-year-old patient lying in the bed with no acute distress.  EYES: Pupils equal, round, reactive to light and accommodation. No scleral icterus. Extraocular muscles intact.  HEENT: Head atraumatic, normocephalic. Oropharynx and nasopharynx clear.  NECK:  Supple, no jugular venous distention. No thyroid enlargement, no tenderness.  LUNGS: Normal breath sounds bilaterally, no wheezing, rales,rhonchi or crepitation. No use of  accessory muscles of respiration.  CARDIOVASCULAR: S1, S2 normal. No murmurs, rubs, or gallops.  ABDOMEN: Soft, nontender, nondistended. Bowel sounds present. No organomegaly or mass.  EXTREMITIES: No pedal edema, cyanosis, or clubbing.  NEUROLOGIC: Cranial nerves II through XII are intact. Muscle strength 5/5 in all extremities. Sensation intact. Gait not checked.  PSYCHIATRIC: The patient is alert and oriented x 3.  SKIN: No obvious rash, lesion, or ulcer.    LABORATORY PANEL:   CBC  Recent Labs Lab 03/29/15 0754  WBC 9.2  HGB 9.8*  HCT 29.7*  PLT 188   ------------------------------------------------------------------------------------------------------------------  Chemistries   Recent Labs Lab 03/28/15 2045  03/30/15 1205 03/30/15 2105  NA 137  < > 139  --   K 7.3*  < > 5.8* 4.7  CL 113*  < > 117*  --   CO2 19*  < > 19*  --   GLUCOSE 123*  < > 113*  --   BUN 40*  < > 18  --   CREATININE 3.50*  < > 1.78*  --   CALCIUM 9.3  < > 8.3*  --   AST 26  --   --   --   ALT 48  --   --   --   ALKPHOS 56  --   --   --   BILITOT 1.2  --   --   --   < > = values in this interval not displayed. ------------------------------------------------------------------------------------------------------------------  Cardiac Enzymes No results for input(s): TROPONINI in the last 168 hours. ------------------------------------------------------------------------------------------------------------------  RADIOLOGY:  No results found.  EKG:   Orders placed or performed during the hospital encounter of 03/28/15  .  EKG 12-Lead  . EKG 12-Lead    ASSESSMENT AND PLAN:  1.ARF  Due to ATN:due to nausea.vomtinmg/diarrhoea;improvinf with fluids;continue IV fluids for furter improvement in renal function.avoid nephrotoxic agents. 2.severe   hyperkaemia ,likley multifactorial  With spironolactone,.ribavirin,losartan. 3.htn ;now hypotensive;hold her coreg,spironolactone and  cozzaar. 4.nausea,vomtimng;resolved; 5. HEpc;hold ribavirin now due to  Severe hyperkalemia,called Dr.Darren Allen Norris and informed about this.he mentioned to hold the drug and see him in office 6.copd;stable;no wheezing; 7.h/o valvular heart disease;follows up with [facility]   All the  records are reviewed and case discussed with Care Management/Social Workerr. Management plans discussed with the patient, family and they are in agreement.  CODE STATUS: full  TOTAL TIME TAKING CARE OF THIS PATIENT: 35 minutes.   POSSIBLE D/C IN 1-2DAYS, DEPENDING ON CLINICAL CONDITION.   Epifanio Lesches M.D on 03/31/2015 at 7:41 AM  Between 7am to 6pm - Pager - [phone]  After 6pm go to [email or web address] - password [email or web address] [facility]  [facility] [place]  Office  [phone]  CC: Primary care physician; Freddy Finner, NP   Note: This dictation was prepared with Dragon dictation along with smaller phrase technology. Any transcriptional errors that result from this process are unintentional.

## 2015-03-31 NOTE — Telephone Encounter (Signed)
Have her restart the medication in one week's time after she has her potassium checked and it is normal. After she restarts the ribavirin she should then have her potassium checked weekly for the next 2-3 weeks.

## 2015-04-07 ENCOUNTER — Encounter: Payer: Self-pay | Admitting: Gastroenterology

## 2015-04-07 ENCOUNTER — Ambulatory Visit: Payer: Medicaid Other | Admitting: Gastroenterology

## 2015-04-07 ENCOUNTER — Ambulatory Visit (INDEPENDENT_AMBULATORY_CARE_PROVIDER_SITE_OTHER): Payer: Medicaid Other | Admitting: Gastroenterology

## 2015-04-07 VITALS — BP 93/56 | HR 74 | Temp 98.3°F | Ht 63.0 in | Wt 162.0 lb

## 2015-04-07 DIAGNOSIS — B1920 Unspecified viral hepatitis C without hepatic coma: Secondary | ICD-10-CM

## 2015-04-07 DIAGNOSIS — B182 Chronic viral hepatitis C: Secondary | ICD-10-CM

## 2015-04-07 NOTE — Progress Notes (Signed)
Primary Care Physician: Freddy Finner, NP  Primary Gastroenterologist:  Dr. Lucilla Lame  Chief Complaint  Patient presents with  . Follow up of ER    HPI: Anne [name] is a 60 y.o. female here for follow-up after being in the hospital for hyperkalemia. The patient was on medication for hepatitis C and was found to have acute renal found ER with increased potassium. The patient had had her hydrochlorothiazide dose doubled just prior to coming to the emergency room and while the patient was in the hospital she was treated for her electrolyte abnormalities and her hepatitis C treatment was stopped. She was told that it may be due to the ribavirin therefore she never resumed the medication for her hepatitis C. Reports that she is feeling better. Her most recent labs showed that her kidney function is improving.  Current Outpatient Prescriptions  Medication Sig Dispense Refill  . albuterol (PROVENTIL HFA;VENTOLIN HFA) 108 (90 Base) MCG/ACT inhaler Inhale 2 puffs into the lungs every 6 (six) hours as needed for wheezing or shortness of breath.    . carvedilol (COREG) 25 MG tablet Take 25 mg by mouth 2 (two) times daily with a meal.    . Fluticasone-Salmeterol (ADVAIR DISKUS) 250-50 MCG/DOSE AEPB Inhale 1 puff into the lungs 2 (two) times daily.     . hydrochlorothiazide (HYDRODIURIL) 25 MG tablet Take 25 mg by mouth daily.    Marland Kitchen omeprazole (PRILOSEC) 20 MG capsule Take 20 mg by mouth daily.     Marland Kitchen tiotropium (SPIRIVA) 18 MCG inhalation capsule Place 18 mcg into inhaler and inhale daily.      No current facility-administered medications for this visit.    Allergies as of 04/07/2015 - Review Complete 04/07/2015  Allergen Reaction Noted  . Ibuprofen Other (See Comments) 01/12/2015  . Nitrofuran derivatives Other (See Comments) 01/06/2015    ROS:  General: Negative for anorexia, weight loss, fever, chills, fatigue, weakness. ENT: Negative for hoarseness, difficulty swallowing , nasal  congestion. CV: Negative for chest pain, angina, palpitations, dyspnea on exertion, peripheral edema.  Respiratory: Negative for dyspnea at rest, dyspnea on exertion, cough, sputum, wheezing.  GI: See history of present illness. GU:  Negative for dysuria, hematuria, urinary incontinence, urinary frequency, nocturnal urination.  Endo: Negative for unusual weight change.    Physical Examination:   BP 93/56 mmHg  Pulse 74  Temp(Src) 98.3 F (36.8 C) (Oral)  Ht 5\' 3"  (1.6 m)  Wt 162 lb (73.483 kg)  BMI 28.70 kg/m2  General: Well-nourished, well-developed in no acute distress.  Eyes: No icterus. Conjunctivae pink. Mouth: Oropharyngeal mucosa moist and pink , no lesions erythema or exudate. Extremities: No lower extremity edema. No clubbing or deformities. Neuro: Alert and oriented x 3.  Grossly intact. Skin: Warm and dry, no jaundice.   Psych: Alert and cooperative, normal mood and affect.  Labs:    Imaging Studies: No results found.  Assessment and Plan:   Anne [name] is a 60 y.o. y/o female comes in after being the hospital for electrolyte abnormalities thought to be from her treatment for her hepatitis C. The patient is doing better now but has been off the medication. The hyperkalemia may have been due to the ribavirin. She is now off of the medication and will be set up to start an alternate treatment for her hepatitis C without ribavirin. The patient will have this paperwork filled out so that she can hopefully get the medication. The patient has been explained the plan and agrees with  it.   Note: This dictation was prepared with Dragon dictation along with smaller phrase technology. Any transcriptional errors that result from this process are unintentional.

## 2015-04-12 ENCOUNTER — Telehealth: Payer: Self-pay

## 2015-04-12 NOTE — Telephone Encounter (Signed)
LVM for pt to return my call. Needed to inform pt the Harvoni was approved to treat her Hep C.

## 2015-04-14 DIAGNOSIS — R031 Nonspecific low blood-pressure reading: Secondary | ICD-10-CM | POA: Insufficient documentation

## 2015-04-14 NOTE — Telephone Encounter (Signed)
Tried contacting pt again regarding Harvoni approval. No vm to leave message.

## 2015-04-18 ENCOUNTER — Other Ambulatory Visit: Payer: Self-pay

## 2015-04-18 DIAGNOSIS — B182 Chronic viral hepatitis C: Secondary | ICD-10-CM

## 2015-04-18 NOTE — Telephone Encounter (Signed)
Pt called today letting me know she received her Harvoni today. Pt will start rx tomorrow 04/19/15. Advised her to watch her symptoms. If she has any of the ones we discussed she is to contact me immediately. We also need to repeat CMP and viral load in 2 weeks. Pt is aware.

## 2015-04-21 ENCOUNTER — Other Ambulatory Visit: Payer: Self-pay | Admitting: Primary Care

## 2015-04-21 DIAGNOSIS — N179 Acute kidney failure, unspecified: Secondary | ICD-10-CM

## 2015-04-26 ENCOUNTER — Ambulatory Visit
Admission: RE | Admit: 2015-04-26 | Discharge: 2015-04-26 | Disposition: A | Discharge status: Home / Self Care | Payer: Medicaid Other | Source: Ambulatory Visit | Attending: Primary Care | Admitting: Primary Care

## 2015-04-26 DIAGNOSIS — N179 Acute kidney failure, unspecified: Secondary | ICD-10-CM | POA: Insufficient documentation

## 2015-05-03 ENCOUNTER — Other Ambulatory Visit
Admission: RE | Admit: 2015-05-03 | Discharge: 2015-05-03 | Disposition: A | Discharge status: Home / Self Care | Payer: Medicaid Other | Source: Ambulatory Visit | Attending: Gastroenterology | Admitting: Gastroenterology

## 2015-05-03 DIAGNOSIS — B182 Chronic viral hepatitis C: Secondary | ICD-10-CM | POA: Diagnosis present

## 2015-05-03 LAB — COMPREHENSIVE METABOLIC PANEL
ALBUMIN: 3.8 g/dL (ref 3.5–5.0)
ALK PHOS: 59 U/L (ref 38–126)
ALT: 14 U/L (ref 14–54)
ANION GAP: 6 (ref 5–15)
AST: 24 U/L (ref 15–41)
BUN: 12 mg/dL (ref 6–20)
CO2: 29 mmol/L (ref 22–32)
Calcium: 9.7 mg/dL (ref 8.9–10.3)
Chloride: 108 mmol/L (ref 101–111)
Creatinine, Ser: 1.3 mg/dL — ABNORMAL HIGH (ref 0.44–1.00)
GFR calc Af Amer: 51 mL/min — ABNORMAL LOW (ref >60)
GFR calc non Af Amer: 44 mL/min — ABNORMAL LOW (ref >60)
Glucose, Bld: 86 mg/dL (ref 65–99)
Potassium: 3.9 mmol/L (ref 3.5–5.1)
SODIUM: 143 mmol/L (ref 135–145)
TOTAL PROTEIN: 7.5 g/dL (ref 6.5–8.1)
Total Bilirubin: 0.5 mg/dL (ref 0.3–1.2)

## 2015-05-04 ENCOUNTER — Encounter: Payer: Self-pay | Admitting: Gastroenterology

## 2015-05-09 ENCOUNTER — Telehealth: Payer: Self-pay

## 2015-05-09 NOTE — Telephone Encounter (Signed)
-----   Message from Lucilla Lame, MD sent at 05/09/2015  2:45 PM EST ----- Let the patient know that her viral load was negative and it should be checked again in a few months.

## 2015-05-09 NOTE — Telephone Encounter (Signed)
Tried contacting pt to give results. No answer.

## 2015-05-10 DIAGNOSIS — N289 Disorder of kidney and ureter, unspecified: Secondary | ICD-10-CM | POA: Insufficient documentation

## 2015-05-11 NOTE — Telephone Encounter (Signed)
Tried contacting pt but no answer. Called pt's daughter to get another number. She stated her mother isn't there. Gave her the lab results. Stated her mother is doing wonderful.

## 2015-06-08 LAB — HCV RT-PCR, QUANT (NON-GRAPH)
HCV LOG10: UNDETERMINED {Log_copies}/mL
HCV LOG10: UNDETERMINED {Log_copies}/mL
IU LOG10: UNDETERMINED {Log_IU}/mL
IU log10: UNDETERMINED Log10 IU/mL

## 2015-08-02 ENCOUNTER — Ambulatory Visit (INDEPENDENT_AMBULATORY_CARE_PROVIDER_SITE_OTHER): Payer: Medicaid Other | Admitting: Gastroenterology

## 2015-08-02 ENCOUNTER — Other Ambulatory Visit
Admission: RE | Admit: 2015-08-02 | Discharge: 2015-08-02 | Disposition: A | Discharge status: Home / Self Care | Payer: Medicaid Other | Source: Ambulatory Visit | Attending: Gastroenterology | Admitting: Gastroenterology

## 2015-08-02 ENCOUNTER — Other Ambulatory Visit: Payer: Self-pay

## 2015-08-02 DIAGNOSIS — B182 Chronic viral hepatitis C: Secondary | ICD-10-CM | POA: Diagnosis not present

## 2015-08-02 LAB — HEPATIC FUNCTION PANEL
ALBUMIN: 4 g/dL (ref 3.5–5.0)
ALK PHOS: 59 U/L (ref 38–126)
ALT: 14 U/L (ref 14–54)
AST: 20 U/L (ref 15–41)
BILIRUBIN TOTAL: 0.9 mg/dL (ref 0.3–1.2)
Total Protein: 7.7 g/dL (ref 6.5–8.1)

## 2015-08-02 NOTE — Progress Notes (Signed)
   Primary Care Physician: Freddy Finner, NP  Primary Gastroenterologist:  Dr. Lucilla Lame  No chief complaint on file.   HPI: Anne [name] is a 60 y.o. female here for follow-up after treatment for hepatitis C. The patient states she finished treatment proximal to 1 month ago. The patient had done well on the treatment states that since finishing the treatment she reports having a lot more energy. The patient's labs had remained normal during her treatment. She has no complaint the present time.  Current Outpatient Prescriptions  Medication Sig Dispense Refill  . albuterol (PROVENTIL HFA;VENTOLIN HFA) 108 (90 Base) MCG/ACT inhaler Inhale 2 puffs into the lungs every 6 (six) hours as needed for wheezing or shortness of breath.    . carvedilol (COREG) 25 MG tablet Take 25 mg by mouth 2 (two) times daily with a meal.    . Fluticasone-Salmeterol (ADVAIR DISKUS) 250-50 MCG/DOSE AEPB Inhale 1 puff into the lungs 2 (two) times daily.     . hydrochlorothiazide (HYDRODIURIL) 25 MG tablet Take 25 mg by mouth daily.    . Ledipasvir-Sofosbuvir (HARVONI) 90-400 MG TABS Take by mouth.    . losartan (COZAAR) 25 MG tablet Take 25 mg by mouth.    . Multiple Vitamin (MULTI-VITAMINS) TABS Take by mouth.    Marland Kitchen omeprazole (PRILOSEC) 20 MG capsule Take 20 mg by mouth daily.     Marland Kitchen tiotropium (SPIRIVA) 18 MCG inhalation capsule Place 18 mcg into inhaler and inhale daily.      No current facility-administered medications for this visit.    Allergies as of 08/02/2015 - Review Complete 04/07/2015  Allergen Reaction Noted  . Ibuprofen Other (See Comments) 01/12/2015  . Nitrofuran derivatives Other (See Comments) 01/06/2015  . Ribavirin Nausea And Vomiting 08/02/2015    ROS:  General: Negative for anorexia, weight loss, fever, chills, fatigue, weakness. ENT: Negative for hoarseness, difficulty swallowing , nasal congestion. CV: Negative for chest pain, angina, palpitations, dyspnea on exertion, peripheral  edema.  Respiratory: Negative for dyspnea at rest, dyspnea on exertion, cough, sputum, wheezing.  GI: See history of present illness. GU:  Negative for dysuria, hematuria, urinary incontinence, urinary frequency, nocturnal urination.  Endo: Negative for unusual weight change.    Physical Examination:   There were no vitals taken for this visit.  General: Well-nourished, well-developed in no acute distress.  Eyes: No icterus. Conjunctivae pink. Neuro: Alert and oriented x 3.  Grossly intact. Skin: Warm and dry, no jaundice.   Psych: Alert and cooperative, normal mood and affect.  Labs:    Imaging Studies: No results found.  Assessment and Plan:   Anne [name] is a 60 y.o. y/o female who comes in today for follow-up after completing treatment for hepatitis C. The patient was on Harvoni. The patient has been doing well since starting the treatment and had no side effects. The patient will have her labs checked for a viral load and liver enzymes at this time. The patient will also have repeat labs in 6 months and one year to document sustained viral response the patient has been explained the plan and agrees with it   Note: This dictation was prepared with Dragon dictation along with smaller phrase technology. Any transcriptional errors that result from this process are unintentional.

## 2015-08-18 LAB — HCV RT-PCR, QUANT (NON-GRAPH)
HCV LOG10: UNDETERMINED {Log_copies}/mL
IU LOG10: UNDETERMINED {Log_IU}/mL

## 2015-08-23 ENCOUNTER — Telehealth: Payer: Self-pay

## 2015-08-23 NOTE — Telephone Encounter (Signed)
-----   Message from Lucilla Lame, MD sent at 08/20/2015 10:28 AM EDT ----- Let the patient note that the hepatitis C was negative

## 2015-08-23 NOTE — Telephone Encounter (Signed)
Tried contacting pt to inform of results. No vm to leave message.

## 2015-08-26 NOTE — Telephone Encounter (Signed)
Tried contacting pt again but no vm to leave message. Left vm on daughter's cell for pt to return my call for results.

## 2015-11-07 ENCOUNTER — Telehealth: Payer: Self-pay | Admitting: Gastroenterology

## 2015-11-07 ENCOUNTER — Other Ambulatory Visit: Payer: Self-pay

## 2015-11-07 ENCOUNTER — Other Ambulatory Visit: Payer: Self-pay | Admitting: Primary Care

## 2015-11-07 DIAGNOSIS — B182 Chronic viral hepatitis C: Secondary | ICD-10-CM

## 2015-11-07 NOTE — Telephone Encounter (Signed)
Advised pt we only need to repeat her labs at 6 months. Order for viral load and LFT's have been done. Pt request these to be mailed.

## 2015-11-07 NOTE — Telephone Encounter (Signed)
Patient called and wanted to know if she needs a 6 month check up after her Hep C medication? Please call

## 2015-11-10 ENCOUNTER — Other Ambulatory Visit
Admission: RE | Admit: 2015-11-10 | Discharge: 2015-11-10 | Disposition: A | Discharge status: Home / Self Care | Payer: Medicaid Other | Source: Ambulatory Visit | Attending: Gastroenterology | Admitting: Gastroenterology

## 2015-11-10 DIAGNOSIS — B182 Chronic viral hepatitis C: Secondary | ICD-10-CM | POA: Diagnosis present

## 2015-11-10 LAB — HEPATIC FUNCTION PANEL
ALBUMIN: 3.8 g/dL (ref 3.5–5.0)
ALT: 15 U/L (ref 14–54)
AST: 22 U/L (ref 15–41)
Alkaline Phosphatase: 74 U/L (ref 38–126)
BILIRUBIN TOTAL: 0.5 mg/dL (ref 0.3–1.2)
Bilirubin, Direct: <0.1 mg/dL — ABNORMAL LOW (ref 0.1–0.5)
TOTAL PROTEIN: 7.6 g/dL (ref 6.5–8.1)

## 2015-11-24 NOTE — Telephone Encounter (Signed)
-----   Message from Lucilla Lame, MD sent at 11/16/2015  7:56 AM EDT ----- Let the patient know that her liver enzymes are normal. Please confirm that she has a negative viral load.

## 2015-11-24 NOTE — Telephone Encounter (Signed)
Waiting on the viral load results before contacting pt.

## 2015-11-25 LAB — HCV RT-PCR, QUANT (NON-GRAPH)
HCV LOG10: UNDETERMINED {Log_copies}/mL
IU log10: UNDETERMINED Log10 IU/mL

## 2015-12-02 ENCOUNTER — Telehealth: Payer: Self-pay

## 2015-12-02 NOTE — Telephone Encounter (Signed)
-----   Message from Lucilla Lame, MD sent at 11/16/2015  7:56 AM EDT ----- Let the patient know that her liver enzymes are normal. Please confirm that she has a negative viral load.

## 2015-12-02 NOTE — Telephone Encounter (Signed)
-----   Message from Lucilla Lame, MD sent at 11/29/2015  9:13 AM EDT ----- Let the patient know that her hepatitis C viral level is negative. Her liver enzymes are also normal.

## 2015-12-02 NOTE — Telephone Encounter (Signed)
Tried contacting pt but no vm was set up. Left vm on daughter's cell for pt to return my call.

## 2015-12-02 NOTE — Telephone Encounter (Signed)
Pt's daughter returned call and she was notified of pt's results. Informed her I will add her to our recall list for a 1 year follow up appt with Dr. Allen Norris.

## 2016-05-17 DIAGNOSIS — M7710 Lateral epicondylitis, unspecified elbow: Secondary | ICD-10-CM | POA: Insufficient documentation

## 2016-11-05 DIAGNOSIS — IMO0001 Reserved for inherently not codable concepts without codable children: Secondary | ICD-10-CM | POA: Insufficient documentation

## 2016-11-27 ENCOUNTER — Other Ambulatory Visit
Admission: RE | Admit: 2016-11-27 | Discharge: 2016-11-27 | Disposition: A | Discharge status: Home / Self Care | Payer: Medicaid Other | Source: Ambulatory Visit | Attending: Gastroenterology | Admitting: Gastroenterology

## 2016-11-27 ENCOUNTER — Encounter: Payer: Self-pay | Admitting: Gastroenterology

## 2016-11-27 ENCOUNTER — Ambulatory Visit (INDEPENDENT_AMBULATORY_CARE_PROVIDER_SITE_OTHER): Payer: Medicaid Other | Admitting: Gastroenterology

## 2016-11-27 ENCOUNTER — Encounter (INDEPENDENT_AMBULATORY_CARE_PROVIDER_SITE_OTHER): Payer: Self-pay

## 2016-11-27 VITALS — BP 129/82 | HR 62 | Temp 97.4°F | Wt 212.2 lb

## 2016-11-27 DIAGNOSIS — Z1211 Encounter for screening for malignant neoplasm of colon: Secondary | ICD-10-CM

## 2016-11-27 DIAGNOSIS — B182 Chronic viral hepatitis C: Secondary | ICD-10-CM | POA: Diagnosis not present

## 2016-11-27 DIAGNOSIS — Z1212 Encounter for screening for malignant neoplasm of rectum: Secondary | ICD-10-CM

## 2016-11-27 LAB — HEPATIC FUNCTION PANEL
ALBUMIN: 4.3 g/dL (ref 3.5–5.0)
ALK PHOS: 67 U/L (ref 38–126)
ALT: 16 U/L (ref 14–54)
AST: 20 U/L (ref 15–41)
BILIRUBIN TOTAL: 0.7 mg/dL (ref 0.3–1.2)
Bilirubin, Direct: <0.1 mg/dL — ABNORMAL LOW (ref 0.1–0.5)
Total Protein: 8.7 g/dL — ABNORMAL HIGH (ref 6.5–8.1)

## 2016-11-27 NOTE — Progress Notes (Signed)
Primary Care Physician: Freddy Finner, NP  Primary Gastroenterologist:  Dr. Lucilla Lame  Chief Complaint  Patient presents with  . hep c fcollow up    HPI: Anne [name] is a 61 y.o. female here for follow up after being treated a year ago for HCV. She has have no problems and reports that she has been doing well.   Current Outpatient Prescriptions  Medication Sig Dispense Refill  . albuterol (PROVENTIL HFA;VENTOLIN HFA) 108 (90 Base) MCG/ACT inhaler Inhale 2 puffs into the lungs every 6 (six) hours as needed for wheezing or shortness of breath.    Marland Kitchen amLODipine (NORVASC) 5 MG tablet Take by mouth.    . carvedilol (COREG) 25 MG tablet Take 25 mg by mouth 2 (two) times daily with a meal.    . Fluticasone-Salmeterol (ADVAIR DISKUS) 250-50 MCG/DOSE AEPB Inhale 1 puff into the lungs 2 (two) times daily.     . hydrochlorothiazide (HYDRODIURIL) 25 MG tablet Take 25 mg by mouth daily.    Marland Kitchen losartan (COZAAR) 25 MG tablet Take 25 mg by mouth.    . meloxicam (MOBIC) 15 MG tablet Mobic 15 mg tablet  Take 1 tablet every day by oral route.    . Multiple Vitamin (MULTI-VITAMINS) TABS Take by mouth.    Marland Kitchen omeprazole (PRILOSEC) 20 MG capsule Take 20 mg by mouth daily.     Marland Kitchen spironolactone (ALDACTONE) 25 MG tablet spironolactone    . tiotropium (SPIRIVA) 18 MCG inhalation capsule Place 18 mcg into inhaler and inhale daily.     . Fluticasone-Salmeterol (ADVAIR) 250-50 MCG/DOSE AEPB Advair Diskus 250 mcg-50 mcg/dose powder for inhalation    . Ledipasvir-Sofosbuvir (HARVONI) 90-400 MG TABS Take by mouth.     No current facility-administered medications for this visit.     Allergies as of 11/27/2016 - Review Complete 11/27/2016  Allergen Reaction Noted  . Ibuprofen Other (See Comments) 01/12/2015  . Nitrofuran derivatives Other (See Comments) 01/06/2015  . Ribavirin Nausea And Vomiting 08/02/2015    ROS:  General: Negative for anorexia, weight loss, fever, chills, fatigue, weakness. ENT:  Negative for hoarseness, difficulty swallowing , nasal congestion. CV: Negative for chest pain, angina, palpitations, dyspnea on exertion, peripheral edema.  Respiratory: Negative for dyspnea at rest, dyspnea on exertion, cough, sputum, wheezing.  GI: See history of present illness. GU:  Negative for dysuria, hematuria, urinary incontinence, urinary frequency, nocturnal urination.  Endo: Negative for unusual weight change.    Physical Examination:   BP 129/82   Pulse 62   Temp (!) 97.4 F (36.3 C) (Oral)   Wt 212 lb 3.2 oz (96.3 kg)   BMI 37.59 kg/m   General: Well-nourished, well-developed in no acute distress.  Eyes: No icterus. Conjunctivae pink. Mouth: Oropharyngeal mucosa moist and pink , no lesions erythema or exudate. Lungs: Clear to auscultation bilaterally. Non-labored. Heart: Regular rate and rhythm, no murmurs rubs or gallops.  Abdomen: Bowel sounds are normal, nontender, nondistended, no hepatosplenomegaly or masses, no abdominal bruits or hernia , no rebound or guarding.   Extremities: No lower extremity edema. No clubbing or deformities. Neuro: Alert and oriented x 3.  Grossly intact. Skin: Warm and dry, no jaundice.   Psych: Alert and cooperative, normal mood and affect.  Labs:    Imaging Studies: No results found.  Assessment and Plan:   Anne [name] is a 61 y.o. y/o female who completed treatment for HCV with Harvoni last year. She will have her labs send to a viral load and will  also be set up for a screening colonoscopy since she has not had one in the past. I have discussed risks & benefits which include, but are not limited to, bleeding, infection, perforation & drug reaction.  The patient agrees with this plan & written consent will be obtained.      Lucilla Lame, MD. [name]   Note: This dictation was prepared with Dragon dictation along with smaller phrase technology. Any transcriptional errors that result from this process are unintentional.

## 2016-11-27 NOTE — Addendum Note (Signed)
Addended by: Peggye Ley on: 11/27/2016 03:55 PM   Modules accepted: Orders, SmartSet

## 2016-11-28 LAB — HCV RNA QUANT: HCV Quantitative: NOT DETECTED IU/mL (ref >50)

## 2016-11-30 ENCOUNTER — Telehealth: Payer: Self-pay

## 2016-11-30 NOTE — Telephone Encounter (Signed)
Pt's daughter informed of her lab results.

## 2016-11-30 NOTE — Telephone Encounter (Signed)
-----   Message from Lucilla Lame, MD sent at 11/29/2016  8:35 PM EDT ----- That the patient know that her hepatitis C was negative.

## 2016-11-30 NOTE — Telephone Encounter (Signed)
-----   Message from Lucilla Lame, MD sent at 11/28/2016  9:23 AM EDT ----- Let the patient know that her liver function tests were normal and we are waiting for the viral load.

## 2016-12-05 ENCOUNTER — Encounter: Payer: Self-pay | Admitting: *Deleted

## 2016-12-05 NOTE — Discharge Instructions (Signed)
General Anesthesia, Adult, Care After °These instructions provide you with information about caring for yourself after your procedure. Your health care provider may also give you more specific instructions. Your treatment has been planned according to current medical practices, but problems sometimes occur. Call your health care provider if you have any problems or questions after your procedure. °What can I expect after the procedure? °After the procedure, it is common to have: °· Vomiting. °· A sore throat. °· Mental slowness. ° °It is common to feel: °· Nauseous. °· Cold or shivery. °· Sleepy. °· Tired. °· Sore or achy, even in parts of your body where you did not have surgery. ° °Follow these instructions at home: °For at least 24 hours after the procedure: °· Do not: °? Participate in activities where you could fall or become injured. °? Drive. °? Use heavy machinery. °? Drink alcohol. °? Take sleeping pills or medicines that cause drowsiness. °? Make important decisions or sign legal documents. °? Take care of children on your own. °· Rest. °Eating and drinking °· If you vomit, drink water, juice, or soup when you can drink without vomiting. °· Drink enough fluid to keep your urine clear or pale yellow. °· Make sure you have little or no nausea before eating solid foods. °· Follow the diet recommended by your health care provider. °General instructions °· Have a responsible adult stay with you until you are awake and alert. °· Return to your normal activities as told by your health care provider. Ask your health care provider what activities are safe for you. °· Take over-the-counter and prescription medicines only as told by your health care provider. °· If you smoke, do not smoke without supervision. °· Keep all follow-up visits as told by your health care provider. This is important. °Contact a health care provider if: °· You continue to have nausea or vomiting at home, and medicines are not helpful. °· You  cannot drink fluids or start eating again. °· You cannot urinate after 8-12 hours. °· You develop a skin rash. °· You have fever. °· You have increasing redness at the site of your procedure. °Get help right away if: °· You have difficulty breathing. °· You have chest pain. °· You have unexpected bleeding. °· You feel that you are having a life-threatening or urgent problem. °This information is not intended to replace advice given to you by your health care provider. Make sure you discuss any questions you have with your health care provider. °Document Released: 06/18/2000 Document Revised: 08/15/2015 Document Reviewed: 02/24/2015 °Elsevier Interactive Patient Education © 2018 [employer] ° °

## 2016-12-10 ENCOUNTER — Ambulatory Visit
Admission: RE | Admit: 2016-12-10 | Discharge: 2016-12-10 | Disposition: A | Discharge status: Home / Self Care | Payer: Medicaid Other | Source: Ambulatory Visit | Attending: Gastroenterology | Admitting: Gastroenterology

## 2016-12-10 ENCOUNTER — Ambulatory Visit: Payer: Medicaid Other | Admitting: Anesthesiology

## 2016-12-10 ENCOUNTER — Encounter
Admission: RE | Disposition: A | Discharge status: Home / Self Care | Payer: Self-pay | Source: Ambulatory Visit | Attending: Gastroenterology

## 2016-12-10 DIAGNOSIS — Z79899 Other long term (current) drug therapy: Secondary | ICD-10-CM | POA: Insufficient documentation

## 2016-12-10 DIAGNOSIS — D124 Benign neoplasm of descending colon: Secondary | ICD-10-CM | POA: Diagnosis not present

## 2016-12-10 DIAGNOSIS — Z8619 Personal history of other infectious and parasitic diseases: Secondary | ICD-10-CM | POA: Insufficient documentation

## 2016-12-10 DIAGNOSIS — Z87891 Personal history of nicotine dependence: Secondary | ICD-10-CM | POA: Diagnosis not present

## 2016-12-10 DIAGNOSIS — I110 Hypertensive heart disease with heart failure: Secondary | ICD-10-CM | POA: Insufficient documentation

## 2016-12-10 DIAGNOSIS — Z6837 Body mass index (BMI) 37.0-37.9, adult: Secondary | ICD-10-CM | POA: Insufficient documentation

## 2016-12-10 DIAGNOSIS — I509 Heart failure, unspecified: Secondary | ICD-10-CM | POA: Diagnosis not present

## 2016-12-10 DIAGNOSIS — K635 Polyp of colon: Secondary | ICD-10-CM | POA: Insufficient documentation

## 2016-12-10 DIAGNOSIS — J449 Chronic obstructive pulmonary disease, unspecified: Secondary | ICD-10-CM | POA: Diagnosis not present

## 2016-12-10 DIAGNOSIS — Z888 Allergy status to other drugs, medicaments and biological substances status: Secondary | ICD-10-CM | POA: Insufficient documentation

## 2016-12-10 DIAGNOSIS — K219 Gastro-esophageal reflux disease without esophagitis: Secondary | ICD-10-CM | POA: Insufficient documentation

## 2016-12-10 DIAGNOSIS — Z886 Allergy status to analgesic agent status: Secondary | ICD-10-CM | POA: Insufficient documentation

## 2016-12-10 DIAGNOSIS — Z1211 Encounter for screening for malignant neoplasm of colon: Secondary | ICD-10-CM

## 2016-12-10 DIAGNOSIS — Z1212 Encounter for screening for malignant neoplasm of rectum: Secondary | ICD-10-CM | POA: Diagnosis not present

## 2016-12-10 DIAGNOSIS — K641 Second degree hemorrhoids: Secondary | ICD-10-CM | POA: Insufficient documentation

## 2016-12-10 HISTORY — PX: POLYPECTOMY: SHX5525

## 2016-12-10 HISTORY — PX: COLONOSCOPY WITH PROPOFOL: SHX5780

## 2016-12-10 SURGERY — COLONOSCOPY WITH PROPOFOL
Anesthesia: General | Site: Rectum | Wound class: Dirty or Infected

## 2016-12-10 MED ORDER — LACTATED RINGERS IV SOLN
10.0000 mL/h | INTRAVENOUS | Status: DC
  Administered 2016-12-10: 10 mL/h via INTRAVENOUS

## 2016-12-10 MED ORDER — LIDOCAINE HCL (CARDIAC) 20 MG/ML IV SOLN
INTRAVENOUS | Status: DC | PRN
  Administered 2016-12-10: 50 mg via INTRAVENOUS

## 2016-12-10 MED ORDER — STERILE WATER FOR IRRIGATION IR SOLN
Status: DC | PRN
  Administered 2016-12-10: 10:00:00

## 2016-12-10 MED ORDER — ACETAMINOPHEN 325 MG PO TABS: 325.0000 mg | ORAL_TABLET | ORAL | Status: DC | PRN

## 2016-12-10 MED ORDER — ONDANSETRON HCL 4 MG/2ML IJ SOLN: 4.0000 mg | Freq: Once | INTRAMUSCULAR | Status: DC | PRN

## 2016-12-10 MED ORDER — LIDOCAINE HCL (CARDIAC) 20 MG/ML IV SOLN: INTRAVENOUS | Status: DC | PRN

## 2016-12-10 MED ORDER — ACETAMINOPHEN 160 MG/5ML PO SOLN: 325.0000 mg | ORAL | Status: DC | PRN

## 2016-12-10 MED ORDER — PROPOFOL 10 MG/ML IV BOLUS
INTRAVENOUS | Status: DC | PRN
  Administered 2016-12-10: 30 mg via INTRAVENOUS
  Administered 2016-12-10: 20 mg via INTRAVENOUS
  Administered 2016-12-10: 50 mg via INTRAVENOUS
  Administered 2016-12-10: 100 mg via INTRAVENOUS
  Administered 2016-12-10: 20 mg via INTRAVENOUS

## 2016-12-10 MED ORDER — SODIUM CHLORIDE 0.9 % IV SOLN: INTRAVENOUS | Status: DC

## 2016-12-10 SURGICAL SUPPLY — 23 items

## 2016-12-10 NOTE — Anesthesia Postprocedure Evaluation (Signed)
Anesthesia Post Note  Patient: [name]  Procedure(s) Performed: Procedure(s) (LRB): COLONOSCOPY WITH PROPOFOL (N/A) POLYPECTOMY (N/A)  Patient location during evaluation: PACU Anesthesia Type: General Level of consciousness: awake and alert Pain management: pain level controlled Vital Signs Assessment: post-procedure vital signs reviewed and stable Respiratory status: spontaneous breathing, nonlabored ventilation, respiratory function stable and patient connected to nasal cannula oxygen Cardiovascular status: blood pressure returned to baseline and stable Postop Assessment: no apparent nausea or vomiting Anesthetic complications: no Comments: In pacu, some transient pvcs / pacs noted on monitor. Pt denies cp or sob. Pt a&o.    Fidel Levy

## 2016-12-10 NOTE — H&P (Signed)
Anne Lame, MD [facility] [address], [place], [place] [address] Phone: [phone] Fax : [phone]  Primary Care Physician:  Freddy Finner, NP Primary Gastroenterologist:  Dr. Allen Norris  Pre-Procedure History & Physical: HPI:  Anne [name] is a 61 y.o. female is here for a screening colonoscopy.   Past Medical History:  Diagnosis Date  . Benign essential HTN 11/16/2014  . Breathlessness on exertion 09/03/2013  . CHF (congestive heart failure) ([place])   . COPD (chronic obstructive pulmonary disease) ([place])   . Exposure to hepatitis B 12/10/2014  . Heart valve disease 09/03/2013  . Hepatitis C    took Harvoni. now clear.  Marland Kitchen History of cardiac catheterization 03/24/2014   Overview:  With normal coronaries 2015     Past Surgical History:  Procedure Laterality Date  . TUBAL LIGATION      Prior to Admission medications   Medication Sig Start Date End Date Taking? Authorizing Provider  albuterol (PROVENTIL HFA;VENTOLIN HFA) 108 (90 Base) MCG/ACT inhaler Inhale 2 puffs into the lungs every 6 (six) hours as needed for wheezing or shortness of breath.   Yes [provider]  amLODipine (NORVASC) 5 MG tablet Take by mouth. 11/05/16 11/05/17 Yes [provider]  aspirin 81 MG tablet Take 81 mg by mouth daily.   Yes [provider]  carvedilol (COREG) 25 MG tablet Take 25 mg by mouth 2 (two) times daily with a meal.   Yes [provider]  Fluticasone-Salmeterol (ADVAIR) 250-50 MCG/DOSE AEPB Advair Diskus 250 mcg-50 mcg/dose powder for inhalation   Yes [provider]  losartan (COZAAR) 25 MG tablet Take 25 mg by mouth.   Yes [provider]  Multiple Vitamin (MULTI-VITAMINS) TABS Take by mouth.   Yes [provider]  omeprazole (PRILOSEC) 20 MG capsule Take 20 mg by mouth daily.    Yes [provider]  spironolactone (ALDACTONE) 25 MG tablet spironolactone   Yes [provider]  tiotropium (SPIRIVA) 18  MCG inhalation capsule Place 18 mcg into inhaler and inhale daily.    Yes [provider]  Fluticasone-Salmeterol (ADVAIR DISKUS) 250-50 MCG/DOSE AEPB Inhale 1 puff into the lungs 2 (two) times daily.     [provider]  hydrochlorothiazide (HYDRODIURIL) 25 MG tablet Take 25 mg by mouth daily.    [provider]  Ledipasvir-Sofosbuvir (HARVONI) 90-400 MG TABS Take by mouth.    [provider]  meloxicam (MOBIC) 15 MG tablet Mobic 15 mg tablet  Take 1 tablet every day by oral route.    [provider]    Allergies as of 11/27/2016 - Review Complete 11/27/2016  Allergen Reaction Noted  . Ibuprofen Other (See Comments) 01/12/2015  . Nitrofuran derivatives Other (See Comments) 01/06/2015  . Ribavirin Nausea And Vomiting 08/02/2015    Family History  Problem Relation Age of Onset  . Hypertension Mother   . Arthritis Mother     Social History   Social History  . Marital status: Single    Spouse name: Anne [name]  . Number of children: Anne [name]  . Years of education: Anne [name]   Occupational History  . Not on file.   Social History Main Topics  . Smoking status: Never Smoker  . Smokeless tobacco: Never Used  . Alcohol use 0.6 oz/week    1 Cans of beer per week     Comment: Occasional   . Drug use: No  . Sexual activity: Not on file   Other Topics Concern  . Not  on file   Social History Narrative  . No narrative on file    Review of Systems: See HPI, otherwise negative ROS  Physical Exam: BP (!) 134/97   Pulse 92   Temp 97.7 F (36.5 C) (Temporal)   Resp 16   Ht 5\' 3"  (1.6 m)   Wt 208 lb (94.3 kg)   SpO2 99%   BMI 36.85 kg/m  General:   Alert,  pleasant and cooperative in NAD Head:  Normocephalic and atraumatic. Neck:  Supple; no masses or thyromegaly. Lungs:  Clear throughout to auscultation.    Heart:  Regular rate and rhythm. Abdomen:  Soft, nontender and nondistended. Normal bowel sounds, without guarding, and without rebound.    Neurologic:  Alert and  oriented x4;  grossly normal neurologically.  Impression/Plan: [name] is now here to undergo a screening colonoscopy.  Risks, benefits, and alternatives regarding colonoscopy have been reviewed with the patient.  Questions have been answered.  All parties agreeable.

## 2016-12-10 NOTE — Anesthesia Preprocedure Evaluation (Signed)
Anesthesia Evaluation  Patient identified by MRN, date of birth, ID band  Reviewed: NPO status   Airway Mallampati: II  TM Distance: >3 FB Neck ROM: full    Dental no notable dental hx.    Pulmonary neg pulmonary ROS, COPD (mild), former smoker,    Pulmonary exam normal        Cardiovascular Exercise Tolerance: Good hypertension, Normal cardiovascular exam     Neuro/Psych negative neurological ROS  negative psych ROS   GI/Hepatic negative GI ROS, GERD  Medicated,(+) Hepatitis - (cured), C  Endo/Other  negative endocrine ROSMorbid obesity (bmi=37)  Renal/GU negative Renal ROS  negative genitourinary   Musculoskeletal   Abdominal   Peds  Hematology negative hematology ROS (+)   Anesthesia Other Findings Cards stable: 10/2016: dr. Nehemiah Massed;  tiva;  Reproductive/Obstetrics                             Anesthesia Physical Anesthesia Plan  ASA: II  Anesthesia Plan: General   Post-op Pain Management:    Induction:   PONV Risk Score and Plan:   Airway Management Planned:   Additional Equipment:   Intra-op Plan:   Post-operative Plan:   Informed Consent: I have reviewed the patients History and Physical, chart, labs and discussed the procedure including the risks, benefits and alternatives for the proposed anesthesia with the patient or authorized representative who has indicated his/her understanding and acceptance.     Plan Discussed with: CRNA  Anesthesia Plan Comments:         Anesthesia Quick Evaluation

## 2016-12-10 NOTE — Anesthesia Procedure Notes (Signed)
Date/Time: 12/10/2016 9:36 AM Performed by: Cameron Ali Pre-anesthesia Checklist: Patient identified, Emergency Drugs available, Suction available, Timeout performed and Patient being monitored Patient Re-evaluated:Patient Re-evaluated prior to induction Oxygen Delivery Method: Nasal cannula Placement Confirmation: positive ETCO2

## 2016-12-10 NOTE — Transfer of Care (Signed)
Immediate Anesthesia Transfer of Care Note  Patient: Anne [name]  Procedure(s) Performed: Procedure(s): COLONOSCOPY WITH PROPOFOL (N/A) POLYPECTOMY (N/A)  Patient Location: PACU  Anesthesia Type: General  Level of Consciousness: awake, alert  and patient cooperative  Airway and Oxygen Therapy: Patient Spontanous Breathing and Patient connected to supplemental oxygen  Post-op Assessment: Post-op Vital signs reviewed, Patient's Cardiovascular Status Stable, Respiratory Function Stable, Patent Airway and No signs of Nausea or vomiting  Post-op Vital Signs: Reviewed and stable  Complications: No apparent anesthesia complications

## 2016-12-10 NOTE — Op Note (Signed)
[facility] Gastroenterology Patient Name: Anne [name] Procedure Date: 12/10/2016 9:35 AM MRN: [id number] Account #: [id number] Date of Birth: 1956/01/14 Admit Type: Outpatient Age: 61 Room: [facility] OR ROOM 01 Gender: Female Note Status: Finalized Procedure:            Colonoscopy Indications:          Screening for colorectal malignant neoplasm Providers:            Lucilla Lame MD, MD Referring MD:         Neomia Dear (Referring MD) Medicines:            Propofol per Anesthesia Complications:        No immediate complications. Procedure:            Pre-Anesthesia Assessment:                       - Prior to the procedure, a History and Physical was                        performed, and patient medications and allergies were                        reviewed. The patient's tolerance of previous                        anesthesia was also reviewed. The risks and benefits of                        the procedure and the sedation options and risks were                        discussed with the patient. All questions were                        answered, and informed consent was obtained. Prior                        Anticoagulants: The patient has taken no previous                        anticoagulant or antiplatelet agents. ASA Grade                        Assessment: II - A patient with mild systemic disease.                        After reviewing the risks and benefits, the patient was                        deemed in satisfactory condition to undergo the                        procedure.                       After obtaining informed consent, the colonoscope was                        passed under direct vision. Throughout the procedure,  the patient's blood pressure, pulse, and oxygen                        saturations were monitored continuously. The Olympus                        Colonoscope 190 [id number]) was introduced through the                   anus and advanced to the the cecum, identified by                        appendiceal orifice and ileocecal valve. The                        colonoscopy was performed without difficulty. The                        patient tolerated the procedure well. The quality of                        the bowel preparation was excellent. Findings:      The perianal and digital rectal examinations were normal.      A 3 mm polyp was found in the descending colon. The polyp was sessile.       The polyp was removed with a cold biopsy forceps. Resection and       retrieval were complete.      Non-bleeding internal hemorrhoids were found during retroflexion. The       hemorrhoids were Grade II (internal hemorrhoids that prolapse but reduce       spontaneously). Impression:           - One 3 mm polyp in the descending colon, removed with                        a cold biopsy forceps. Resected and retrieved.                       - Non-bleeding internal hemorrhoids. Recommendation:       - Discharge patient to home.                       - Resume previous diet.                       - Continue present medications.                       - Await pathology results.                       - Repeat colonoscopy in 5 years if polyp adenoma and 10                        years if hyperplastic Procedure Code(s):    --- Professional ---                       [phone], Colonoscopy, flexible; with biopsy, single or                        multiple Diagnosis Code(s):    --- Professional ---  Z12.11, Encounter for screening for malignant neoplasm                        of colon                       D12.4, Benign neoplasm of descending colon CPT copyright 2016 American Medical Association. All rights reserved. The codes documented in this report are preliminary and upon coder review may  be revised to meet current compliance requirements. Lucilla Lame MD, MD 12/10/2016 9:52:05 AM This  report has been signed electronically. Number of Addenda: 0 Note Initiated On: 12/10/2016 9:35 AM Scope Withdrawal Time: 0 hours 6 minutes 48 seconds  Total Procedure Duration: 0 hours 8 minutes 23 seconds       [facility]

## 2016-12-11 ENCOUNTER — Encounter: Payer: Self-pay | Admitting: Gastroenterology

## 2016-12-13 ENCOUNTER — Encounter: Payer: Self-pay | Admitting: Gastroenterology

## 2016-12-19 ENCOUNTER — Encounter: Payer: Self-pay | Admitting: Gastroenterology

## 2017-12-27 ENCOUNTER — Other Ambulatory Visit: Payer: Self-pay | Admitting: Primary Care

## 2017-12-27 DIAGNOSIS — Z1231 Encounter for screening mammogram for malignant neoplasm of breast: Secondary | ICD-10-CM

## 2018-01-31 ENCOUNTER — Ambulatory Visit
Admission: RE | Admit: 2018-01-31 | Discharge: 2018-01-31 | Disposition: A | Discharge status: Home / Self Care | Payer: Medicaid Other | Source: Ambulatory Visit | Attending: Primary Care | Admitting: Primary Care

## 2018-01-31 DIAGNOSIS — Z1231 Encounter for screening mammogram for malignant neoplasm of breast: Secondary | ICD-10-CM | POA: Diagnosis not present

## 2019-05-23 ENCOUNTER — Inpatient Hospital Stay
Admission: EM | Admit: 2019-05-23 | Discharge: 2019-05-27 | DRG: 208 | Disposition: A | Discharge status: Other Acute Inpatient Hospital | Payer: Medicaid Other | Attending: Pulmonary Disease | Admitting: Pulmonary Disease

## 2019-05-23 ENCOUNTER — Encounter: Payer: Self-pay | Admitting: Emergency Medicine

## 2019-05-23 ENCOUNTER — Emergency Department: Payer: Medicaid Other

## 2019-05-23 ENCOUNTER — Other Ambulatory Visit: Payer: Self-pay

## 2019-05-23 DIAGNOSIS — Z4659 Encounter for fitting and adjustment of other gastrointestinal appliance and device: Secondary | ICD-10-CM

## 2019-05-23 DIAGNOSIS — J1282 Pneumonia due to coronavirus disease 2019: Secondary | ICD-10-CM | POA: Diagnosis present

## 2019-05-23 DIAGNOSIS — J9600 Acute respiratory failure, unspecified whether with hypoxia or hypercapnia: Secondary | ICD-10-CM

## 2019-05-23 DIAGNOSIS — E860 Dehydration: Secondary | ICD-10-CM | POA: Diagnosis present

## 2019-05-23 DIAGNOSIS — Z8249 Family history of ischemic heart disease and other diseases of the circulatory system: Secondary | ICD-10-CM | POA: Diagnosis not present

## 2019-05-23 DIAGNOSIS — N200 Calculus of kidney: Secondary | ICD-10-CM | POA: Diagnosis present

## 2019-05-23 DIAGNOSIS — J9602 Acute respiratory failure with hypercapnia: Secondary | ICD-10-CM | POA: Diagnosis present

## 2019-05-23 DIAGNOSIS — E1165 Type 2 diabetes mellitus with hyperglycemia: Secondary | ICD-10-CM | POA: Diagnosis not present

## 2019-05-23 DIAGNOSIS — Z886 Allergy status to analgesic agent status: Secondary | ICD-10-CM | POA: Diagnosis not present

## 2019-05-23 DIAGNOSIS — I959 Hypotension, unspecified: Secondary | ICD-10-CM

## 2019-05-23 DIAGNOSIS — U071 COVID-19: Secondary | ICD-10-CM | POA: Diagnosis present

## 2019-05-23 DIAGNOSIS — M791 Myalgia, unspecified site: Secondary | ICD-10-CM | POA: Diagnosis present

## 2019-05-23 DIAGNOSIS — E785 Hyperlipidemia, unspecified: Secondary | ICD-10-CM | POA: Diagnosis present

## 2019-05-23 DIAGNOSIS — E870 Hyperosmolality and hypernatremia: Secondary | ICD-10-CM | POA: Diagnosis not present

## 2019-05-23 DIAGNOSIS — R7989 Other specified abnormal findings of blood chemistry: Secondary | ICD-10-CM

## 2019-05-23 DIAGNOSIS — R0603 Acute respiratory distress: Secondary | ICD-10-CM | POA: Diagnosis not present

## 2019-05-23 DIAGNOSIS — I248 Other forms of acute ischemic heart disease: Secondary | ICD-10-CM | POA: Diagnosis not present

## 2019-05-23 DIAGNOSIS — R571 Hypovolemic shock: Secondary | ICD-10-CM | POA: Diagnosis not present

## 2019-05-23 DIAGNOSIS — E872 Acidosis: Secondary | ICD-10-CM | POA: Diagnosis present

## 2019-05-23 DIAGNOSIS — A0839 Other viral enteritis: Secondary | ICD-10-CM | POA: Diagnosis present

## 2019-05-23 DIAGNOSIS — J9690 Respiratory failure, unspecified, unspecified whether with hypoxia or hypercapnia: Secondary | ICD-10-CM

## 2019-05-23 DIAGNOSIS — E1122 Type 2 diabetes mellitus with diabetic chronic kidney disease: Secondary | ICD-10-CM | POA: Diagnosis present

## 2019-05-23 DIAGNOSIS — J9601 Acute respiratory failure with hypoxia: Secondary | ICD-10-CM | POA: Diagnosis present

## 2019-05-23 DIAGNOSIS — Z7951 Long term (current) use of inhaled steroids: Secondary | ICD-10-CM | POA: Diagnosis not present

## 2019-05-23 DIAGNOSIS — Z01818 Encounter for other preprocedural examination: Secondary | ICD-10-CM

## 2019-05-23 DIAGNOSIS — Z7982 Long term (current) use of aspirin: Secondary | ICD-10-CM | POA: Diagnosis not present

## 2019-05-23 DIAGNOSIS — Z9911 Dependence on respirator [ventilator] status: Secondary | ICD-10-CM | POA: Diagnosis not present

## 2019-05-23 DIAGNOSIS — D6489 Other specified anemias: Secondary | ICD-10-CM | POA: Diagnosis not present

## 2019-05-23 DIAGNOSIS — J80 Acute respiratory distress syndrome: Secondary | ICD-10-CM | POA: Diagnosis present

## 2019-05-23 DIAGNOSIS — I700 Atherosclerosis of aorta: Secondary | ICD-10-CM | POA: Diagnosis present

## 2019-05-23 DIAGNOSIS — N179 Acute kidney failure, unspecified: Secondary | ICD-10-CM | POA: Diagnosis present

## 2019-05-23 DIAGNOSIS — Z8261 Family history of arthritis: Secondary | ICD-10-CM | POA: Diagnosis not present

## 2019-05-23 DIAGNOSIS — K219 Gastro-esophageal reflux disease without esophagitis: Secondary | ICD-10-CM | POA: Diagnosis present

## 2019-05-23 DIAGNOSIS — R001 Bradycardia, unspecified: Secondary | ICD-10-CM | POA: Diagnosis not present

## 2019-05-23 DIAGNOSIS — J440 Chronic obstructive pulmonary disease with acute lower respiratory infection: Secondary | ICD-10-CM | POA: Diagnosis present

## 2019-05-23 DIAGNOSIS — N189 Chronic kidney disease, unspecified: Secondary | ICD-10-CM | POA: Diagnosis present

## 2019-05-23 DIAGNOSIS — Z6839 Body mass index (BMI) 39.0-39.9, adult: Secondary | ICD-10-CM

## 2019-05-23 DIAGNOSIS — Z791 Long term (current) use of non-steroidal anti-inflammatories (NSAID): Secondary | ICD-10-CM | POA: Diagnosis not present

## 2019-05-23 DIAGNOSIS — R439 Unspecified disturbances of smell and taste: Secondary | ICD-10-CM | POA: Diagnosis present

## 2019-05-23 DIAGNOSIS — I129 Hypertensive chronic kidney disease with stage 1 through stage 4 chronic kidney disease, or unspecified chronic kidney disease: Secondary | ICD-10-CM | POA: Diagnosis present

## 2019-05-23 DIAGNOSIS — B1920 Unspecified viral hepatitis C without hepatic coma: Secondary | ICD-10-CM | POA: Diagnosis present

## 2019-05-23 DIAGNOSIS — Z888 Allergy status to other drugs, medicaments and biological substances status: Secondary | ICD-10-CM | POA: Diagnosis not present

## 2019-05-23 LAB — URINALYSIS, COMPLETE (UACMP) WITH MICROSCOPIC
Bilirubin Urine: NEGATIVE
Glucose, UA: NEGATIVE mg/dL
Hgb urine dipstick: NEGATIVE
Ketones, ur: NEGATIVE mg/dL
Leukocytes,Ua: NEGATIVE
Nitrite: NEGATIVE
Protein, ur: NEGATIVE mg/dL
Specific Gravity, Urine: 1.009 (ref 1.005–1.030)
pH: 5 (ref 5.0–8.0)

## 2019-05-23 LAB — PROCALCITONIN: Procalcitonin: 0.14 ng/mL

## 2019-05-23 LAB — CBC WITH DIFFERENTIAL/PLATELET
Abs Immature Granulocytes: 0.04 10*3/uL (ref 0.00–0.07)
Basophils Absolute: 0 10*3/uL (ref 0.0–0.1)
Basophils Relative: 0 %
Eosinophils Absolute: 0 10*3/uL (ref 0.0–0.5)
Eosinophils Relative: 0 %
HCT: 34.9 % — ABNORMAL LOW (ref 36.0–46.0)
Hemoglobin: 11.5 g/dL — ABNORMAL LOW (ref 12.0–15.0)
Immature Granulocytes: 0 %
Lymphocytes Relative: 14 %
Lymphs Abs: 1.3 10*3/uL (ref 0.7–4.0)
MCH: 28.7 pg (ref 26.0–34.0)
MCHC: 33 g/dL (ref 30.0–36.0)
MCV: 87 fL (ref 80.0–100.0)
Monocytes Absolute: 0.8 10*3/uL (ref 0.1–1.0)
Monocytes Relative: 9 %
Neutro Abs: 6.9 10*3/uL (ref 1.7–7.7)
Neutrophils Relative %: 77 %
Platelets: 221 10*3/uL (ref 150–400)
RBC: 4.01 MIL/uL (ref 3.87–5.11)
RDW: 13.5 % (ref 11.5–15.5)
WBC: 9 10*3/uL (ref 4.0–10.5)
nRBC: 0 % (ref 0.0–0.2)

## 2019-05-23 LAB — BASIC METABOLIC PANEL
Anion gap: 9 (ref 5–15)
BUN: 41 mg/dL — ABNORMAL HIGH (ref 8–23)
CO2: 11 mmol/L — ABNORMAL LOW (ref 22–32)
Calcium: 7.7 mg/dL — ABNORMAL LOW (ref 8.9–10.3)
Chloride: 115 mmol/L — ABNORMAL HIGH (ref 98–111)
Creatinine, Ser: 4.06 mg/dL — ABNORMAL HIGH (ref 0.44–1.00)
GFR calc Af Amer: 13 mL/min — ABNORMAL LOW (ref >60)
GFR calc non Af Amer: 11 mL/min — ABNORMAL LOW (ref >60)
Glucose, Bld: 155 mg/dL — ABNORMAL HIGH (ref 70–99)
Potassium: 4.6 mmol/L (ref 3.5–5.1)
Sodium: 135 mmol/L (ref 135–145)

## 2019-05-23 LAB — COMPREHENSIVE METABOLIC PANEL
ALT: 25 U/L (ref 0–44)
AST: 37 U/L (ref 15–41)
Albumin: 3.9 g/dL (ref 3.5–5.0)
Alkaline Phosphatase: 63 U/L (ref 38–126)
Anion gap: 14 (ref 5–15)
BUN: 45 mg/dL — ABNORMAL HIGH (ref 8–23)
CO2: 12 mmol/L — ABNORMAL LOW (ref 22–32)
Calcium: 8.8 mg/dL — ABNORMAL LOW (ref 8.9–10.3)
Chloride: 107 mmol/L (ref 98–111)
Creatinine, Ser: 5.34 mg/dL — ABNORMAL HIGH (ref 0.44–1.00)
GFR calc Af Amer: 9 mL/min — ABNORMAL LOW (ref >60)
GFR calc non Af Amer: 8 mL/min — ABNORMAL LOW (ref >60)
Glucose, Bld: 117 mg/dL — ABNORMAL HIGH (ref 70–99)
Potassium: 4.4 mmol/L (ref 3.5–5.1)
Sodium: 133 mmol/L — ABNORMAL LOW (ref 135–145)
Total Bilirubin: 0.6 mg/dL (ref 0.3–1.2)
Total Protein: 8.4 g/dL — ABNORMAL HIGH (ref 6.5–8.1)

## 2019-05-23 LAB — LACTATE DEHYDROGENASE: LDH: 256 U/L — ABNORMAL HIGH (ref 98–192)

## 2019-05-23 LAB — FIBRINOGEN: Fibrinogen: 711 mg/dL — ABNORMAL HIGH (ref 210–475)

## 2019-05-23 LAB — LACTIC ACID, PLASMA: Lactic Acid, Venous: 1.2 mmol/L (ref 0.5–1.9)

## 2019-05-23 LAB — FIBRIN DERIVATIVES D-DIMER (ARMC ONLY): Fibrin derivatives D-dimer (ARMC): 989.47 ng/mL (FEU) — ABNORMAL HIGH (ref 0.00–499.00)

## 2019-05-23 LAB — C DIFFICILE QUICK SCREEN W PCR REFLEX
C Diff antigen: NEGATIVE
C Diff interpretation: NOT DETECTED
C Diff toxin: NEGATIVE

## 2019-05-23 LAB — GLUCOSE, CAPILLARY: Glucose-Capillary: 144 mg/dL — ABNORMAL HIGH (ref 70–99)

## 2019-05-23 LAB — MAGNESIUM: Magnesium: 1.6 mg/dL — ABNORMAL LOW (ref 1.7–2.4)

## 2019-05-23 LAB — BRAIN NATRIURETIC PEPTIDE: B Natriuretic Peptide: 22 pg/mL (ref 0.0–100.0)

## 2019-05-23 LAB — C-REACTIVE PROTEIN: CRP: 2.2 mg/dL — ABNORMAL HIGH (ref <1.0)

## 2019-05-23 LAB — TRIGLYCERIDES: Triglycerides: 164 mg/dL — ABNORMAL HIGH (ref <150)

## 2019-05-23 LAB — TROPONIN I (HIGH SENSITIVITY)
Troponin I (High Sensitivity): 27 ng/L — ABNORMAL HIGH (ref <18)
Troponin I (High Sensitivity): 24 ng/L — ABNORMAL HIGH (ref <18)
Troponin I (High Sensitivity): 21 ng/L — ABNORMAL HIGH (ref <18)

## 2019-05-23 LAB — FERRITIN: Ferritin: 817 ng/mL — ABNORMAL HIGH (ref 11–307)

## 2019-05-23 LAB — POC SARS CORONAVIRUS 2 AG: SARS Coronavirus 2 Ag: POSITIVE — AB

## 2019-05-23 MED ORDER — MOMETASONE FURO-FORMOTEROL FUM 200-5 MCG/ACT IN AERO
2.0000 | INHALATION_SPRAY | Freq: Two times a day (BID) | RESPIRATORY_TRACT | Status: DC
  Administered 2019-05-24 (×2): 2 via RESPIRATORY_TRACT
  Filled 2019-05-23 (×2): qty 8.8

## 2019-05-23 MED ORDER — DOCUSATE SODIUM 100 MG PO CAPS: 200.0000 mg | ORAL_CAPSULE | Freq: Two times a day (BID) | ORAL | Status: DC | PRN

## 2019-05-23 MED ORDER — ONDANSETRON 4 MG PO TBDP
4.0000 mg | ORAL_TABLET | Freq: Three times a day (TID) | ORAL | Status: DC | PRN
  Filled 2019-05-23: qty 1

## 2019-05-23 MED ORDER — IPRATROPIUM-ALBUTEROL 20-100 MCG/ACT IN AERS
1.0000 | INHALATION_SPRAY | Freq: Four times a day (QID) | RESPIRATORY_TRACT | Status: DC | PRN
  Filled 2019-05-23: qty 4

## 2019-05-23 MED ORDER — STERILE WATER FOR INJECTION IV SOLN
INTRAVENOUS | Status: DC
  Filled 2019-05-23 (×4): qty 850

## 2019-05-23 MED ORDER — SODIUM CHLORIDE 0.9 % IV BOLUS
1000.0000 mL | Freq: Once | INTRAVENOUS | Status: AC
  Administered 2019-05-23: 16:00:00 1000 mL via INTRAVENOUS

## 2019-05-23 MED ORDER — DEXAMETHASONE SODIUM PHOSPHATE 10 MG/ML IJ SOLN: 10.0000 mg | Freq: Once | INTRAMUSCULAR | Status: DC

## 2019-05-23 MED ORDER — CHLORHEXIDINE GLUCONATE CLOTH 2 % EX PADS
6.0000 | MEDICATED_PAD | Freq: Every day | CUTANEOUS | Status: DC
  Administered 2019-05-24 – 2019-05-26 (×2): 6 via TOPICAL

## 2019-05-23 MED ORDER — SODIUM CHLORIDE 0.9 % IV BOLUS
1000.0000 mL | Freq: Once | INTRAVENOUS | Status: AC
  Administered 2019-05-23: 15:00:00 1000 mL via INTRAVENOUS

## 2019-05-23 MED ORDER — MAGNESIUM SULFATE 2 GM/50ML IV SOLN
2.0000 g | Freq: Once | INTRAVENOUS | Status: AC
  Administered 2019-05-23: 2 g via INTRAVENOUS
  Filled 2019-05-23: qty 50

## 2019-05-23 MED ORDER — ZINC SULFATE 220 (50 ZN) MG PO CAPS
220.0000 mg | ORAL_CAPSULE | Freq: Every day | ORAL | Status: DC
  Administered 2019-05-24 – 2019-05-27 (×4): 220 mg via ORAL
  Filled 2019-05-23 (×4): qty 1

## 2019-05-23 MED ORDER — DEXAMETHASONE SODIUM PHOSPHATE 10 MG/ML IJ SOLN
6.0000 mg | INTRAMUSCULAR | Status: DC
  Administered 2019-05-24 – 2019-05-26 (×3): 6 mg via INTRAVENOUS
  Filled 2019-05-23 (×4): qty 0.6

## 2019-05-23 MED ORDER — ASCORBIC ACID 500 MG PO TABS
500.0000 mg | ORAL_TABLET | Freq: Every day | ORAL | Status: DC
  Administered 2019-05-24 – 2019-05-27 (×4): 500 mg via ORAL
  Filled 2019-05-23 (×4): qty 1

## 2019-05-23 MED ORDER — DEXAMETHASONE SODIUM PHOSPHATE 10 MG/ML IJ SOLN
8.0000 mg | Freq: Once | INTRAMUSCULAR | Status: AC
  Administered 2019-05-23: 15:00:00 8 mg via INTRAVENOUS
  Filled 2019-05-23: qty 1

## 2019-05-23 MED ORDER — SODIUM BICARBONATE 8.4 % IV SOLN
150.0000 meq | Freq: Once | INTRAVENOUS | Status: AC
  Administered 2019-05-23: 150 meq via INTRAVENOUS
  Filled 2019-05-23: qty 150

## 2019-05-23 MED ORDER — HEPARIN SODIUM (PORCINE) 5000 UNIT/ML IJ SOLN
5000.0000 [IU] | Freq: Three times a day (TID) | INTRAMUSCULAR | Status: DC
  Administered 2019-05-23 – 2019-05-24 (×4): 5000 [IU] via SUBCUTANEOUS
  Filled 2019-05-23 (×3): qty 1

## 2019-05-23 MED ORDER — MORPHINE SULFATE (PF) 2 MG/ML IV SOLN
2.0000 mg | INTRAVENOUS | Status: DC | PRN
  Filled 2019-05-23: qty 1

## 2019-05-23 MED ORDER — SODIUM CHLORIDE 0.9 % IV BOLUS
1000.0000 mL | Freq: Once | INTRAVENOUS | Status: AC
  Administered 2019-05-23: 17:00:00 1000 mL via INTRAVENOUS

## 2019-05-23 MED ORDER — SODIUM CHLORIDE 0.9% FLUSH
3.0000 mL | Freq: Two times a day (BID) | INTRAVENOUS | Status: DC
  Administered 2019-05-23 – 2019-05-26 (×7): 3 mL via INTRAVENOUS

## 2019-05-23 MED ORDER — ALBUTEROL SULFATE HFA 108 (90 BASE) MCG/ACT IN AERS
2.0000 | INHALATION_SPRAY | Freq: Once | RESPIRATORY_TRACT | Status: AC
  Administered 2019-05-23: 2 via RESPIRATORY_TRACT
  Filled 2019-05-23: qty 6.7

## 2019-05-23 MED ORDER — ACETAMINOPHEN 650 MG RE SUPP
650.0000 mg | Freq: Four times a day (QID) | RECTAL | Status: DC | PRN
  Filled 2019-05-23: qty 1

## 2019-05-23 MED ORDER — BISACODYL 5 MG PO TBEC: 5.0000 mg | DELAYED_RELEASE_TABLET | Freq: Every day | ORAL | Status: DC | PRN

## 2019-05-23 MED ORDER — ACETAMINOPHEN 325 MG PO TABS: 650.0000 mg | ORAL_TABLET | Freq: Four times a day (QID) | ORAL | Status: DC | PRN

## 2019-05-23 MED ORDER — SODIUM CHLORIDE 0.9 % IV SOLN: INTRAVENOUS | Status: DC

## 2019-05-23 MED ORDER — TRAMADOL HCL 50 MG PO TABS: 50.0000 mg | ORAL_TABLET | Freq: Four times a day (QID) | ORAL | Status: DC | PRN

## 2019-05-23 MED ORDER — ENOXAPARIN SODIUM 40 MG/0.4ML ~~LOC~~ SOLN: 30.0000 mg | SUBCUTANEOUS | Status: DC

## 2019-05-23 MED ORDER — ONDANSETRON HCL 4 MG/2ML IJ SOLN: 4.0000 mg | Freq: Three times a day (TID) | INTRAMUSCULAR | Status: DC | PRN

## 2019-05-23 NOTE — Consult Note (Signed)
Pharmacy consulted on use of remdesivir in a patient with AKI. D/w attending there is limited data in this patient population, but does recommended against the use. May consider clinical judgement in risk vs benefit.   Thank you for allowing pharmacy to be a part of this patient's care.  Kristeen Miss, PharmD Clinical Pharmacist

## 2019-05-23 NOTE — ED Triage Notes (Signed)
Pt here for [facility]. covid + 2/19. sats 89% RA.  C/o left flank pain.

## 2019-05-23 NOTE — Plan of Care (Addendum)
Notified SWOT nurse and on call NP - MEWS score is 3.  Pt is hypotensive after 3 boluses of fluid, and hasn't voided since coming to the hospital. Tachypneic on 4L O2. She is alert, appears pale and is trembling. SWOT nurse is in the room.  See NP note.

## 2019-05-23 NOTE — Plan of Care (Addendum)
SWOT RN informed me she's going to go to stepdown b/c of acidosis, inc O2 needs, hypotension after 3L of fluid w/no output. Attempted to call daughter w/no success.

## 2019-05-23 NOTE — Consult Note (Signed)
Name: Anne [name] MRN: [id number] DOB: 01-21-1956    ADMISSION DATE:  05/23/2019 CONSULTATION DATE:  05/23/2019  REFERRING MD :  Sharion Settler, NP  CHIEF COMPLAINT:  Shortness of breath  BRIEF PATIENT DESCRIPTION:  64 y.o. Female admitted 2/27 with Acute Hypoxic Respiratory Failure secondary to COVID-19 Pneumonia and severe metabolic acidosis, hypotension, along with severe AKI requiring Bicarb gtt.  SIGNIFICANT EVENTS  2/27- Admission to [name] 2/27- Transfer to Stepdown due to hypotension, increasing FiO2 requirements, metabolic acidosis requiring Bicarb gtt  STUDIES:  2/27- CXR>>Multifocal pneumonia   2/27- CT Renal Stone Study>> 1. Left nephrolithiasis, without obstructive uropathy. 2. Bibasilar peripheral ground-glass opacities, consistent with COVID-19 pneumonia. More focal nodular opacity in the left lower lobe could represent an area of consolidation or a true pulmonary nodule. Recommend follow-up chest CT at 3 months, after the patient is recovered from the acute illness. 3. Suspicion of cirrhosis, especially given the history of hepatitis B and C. 4.  Aortic Atherosclerosis  CULTURES: POC SARS-CoV-2 2/27>> POSITIVE Blood culture x2 2/27>> Strep pneumo urinary antigen 2/27>> Legionella urinary antigen 2/27>>  ANTIBIOTICS: N/A  HISTORY OF PRESENT ILLNESS:   Anne [name] is a 64 y.o. Female with a past medical history notable for hypertension, CHF, COPD, Hepatitis C s/p treatment, CAD who presents to [name] on 05/23/19 due to progressive shortness of breath.  She tested positive for COVID-19 5 days ago (her brother recently passed away from [place]).  She reports she initially had some shortness of breath that improved, but over the last 3 days her shortness of breath has progressed to being present with minimal exertion.  She also reports associated loss of smell , fever, chills, body aches, and diarrhea.  She denies any bloody or black tarry stools, chest  pain, dizziness, abdominal pain, dysuria, urinary frequency, or constipation.  Upon presentation to the ED she was noted to be hypoxic, tachypneic, and hypotensive.   Her vitals signs included: Temp 99.7, BP 93/52, HR 78, RR 25, and SpO2 94% on 4L [place]. She was subquently  placed on 4L Nasal cannula and given 3L IV Fluid boluses.  Initial workup in the ED revealed Creatinine 5.34, BUN 45, bicarb 12, anion gap 14, sodium 133, high sensitivity troponin 27, ferritin 817, LDH 256, CRP 2.2, fibrinogen 711, d-dimer 989, lactic acid 1.2, procalcitonin 0.14, WBC 9, Hgb 11.5.  Her POC COVID-19 is POSITIVE.  CXR is concerning for multifocal pneumonia.  CT renal stone study with left nephrolithiasis without obstructive uropathy or hydronephrosis.  She was admitted to [name] unit by hospitalist.   While on the med-surg unit she was again noted to be hypotensive with no urine output since admission, along with increasing FiO2 requirements from 4L to 6L.  A foley was placed, and repeat BMP was obtained which revealed severe metabolic acidosis.  She was given 3 amps of Bicarb and placed on Bicarb drip.  Given her hypotension, increasing FiO2 requirements, and severe metabolic acidosis, she was transferred to [name] unit for closer monitoring.  Upon transfer to [name], pt hypotensive with tachypnea (RR 30's).  PCCM is consulted to assist with management of Acute Hypoxic Respiratory Failure in the setting of COVID-19 Pneumonia and severe metabolic acidosis, hypotension, and AKI requiring Bicarb drip.  PAST MEDICAL HISTORY :   has a past medical history of Benign essential HTN (11/16/2014), Breathlessness on exertion (09/03/2013), CHF (congestive heart failure) ([place]), COPD (chronic obstructive pulmonary disease) ([place]), Exposure to hepatitis B (12/10/2014), Heart valve  disease (09/03/2013), Hepatitis C, and History of cardiac catheterization (03/24/2014).  has a past surgical history that includes Tubal ligation;  Colonoscopy with propofol (N/A, 12/10/2016); and polypectomy (N/A, 12/10/2016). Prior to Admission medications   Medication Sig Start Date End Date Taking? Authorizing Provider  albuterol (PROVENTIL HFA;VENTOLIN HFA) 108 (90 Base) MCG/ACT inhaler Inhale 2 puffs into the lungs every 6 (six) hours as needed for wheezing or shortness of breath.   Yes [provider]  aspirin 81 MG tablet Take 81 mg by mouth daily.   Yes [provider]  carvedilol (COREG) 25 MG tablet Take 25 mg by mouth 2 (two) times daily with a meal.   Yes [provider]  Fluticasone-Salmeterol (ADVAIR DISKUS) 250-50 MCG/DOSE AEPB Inhale 1 puff into the lungs 2 (two) times daily.    Yes [provider]  hydrochlorothiazide (HYDRODIURIL) 25 MG tablet Take 25 mg by mouth daily.   Yes [provider]  Ledipasvir-Sofosbuvir (HARVONI) 90-400 MG TABS Take by mouth.   Yes [provider]  losartan (COZAAR) 25 MG tablet Take 25 mg by mouth.   Yes [provider]  meloxicam (MOBIC) 15 MG tablet Mobic 15 mg tablet  Take 1 tablet every day by oral route.   Yes [provider]  Multiple Vitamin (MULTI-VITAMINS) TABS Take by mouth.   Yes [provider]  omeprazole (PRILOSEC) 20 MG capsule Take 20 mg by mouth daily.    Yes [provider]  spironolactone (ALDACTONE) 25 MG tablet Take 25 mg by mouth daily.    Yes [provider]  tiotropium (SPIRIVA) 18 MCG inhalation capsule Place 18 mcg into inhaler and inhale daily.    Yes [provider]  amLODipine (NORVASC) 5 MG tablet Take by mouth. 11/05/16 11/05/17  [provider]  Fluticasone-Salmeterol (ADVAIR) 250-50 MCG/DOSE AEPB Advair Diskus 250 mcg-50 mcg/dose powder for inhalation    [provider]   Allergies  Allergen Reactions  . Ibuprofen Other (See Comments)    Reaction:  Unknown   . Nitrofuran Derivatives Other (See Comments)    Reaction:  Unknown   .  Ribavirin Nausea And Vomiting    FAMILY HISTORY:  family history includes Arthritis in her mother; Hypertension in her mother. SOCIAL HISTORY:  reports that she has never smoked. She has never used smokeless tobacco. She reports current alcohol use of about 1.0 standard drinks of alcohol per week. She reports that she does not use drugs.   COVID-19 DISASTER DECLARATION:  FULL CONTACT PHYSICAL EXAMINATION WAS NOT POSSIBLE DUE TO TREATMENT OF COVID-19 AND  CONSERVATION OF PERSONAL PROTECTIVE EQUIPMENT, LIMITED EXAM FINDINGS INCLUDE-  Patient assessed or the symptoms described in the history of present illness.  In the context of the Global COVID-19 pandemic, which necessitated consideration that the patient might be at risk for infection with the SARS-CoV-2 virus that causes COVID-19, Institutional protocols and algorithms that pertain to the evaluation of patients at risk for COVID-19 are in a state of rapid change based on information released by regulatory bodies including the CDC and federal and state organizations. These policies and algorithms were followed during the patient's care while in hospital.  REVIEW OF SYSTEMS:  Positives in BOLD Constitutional: Negative for fever, chills, weight loss, +malaise/fatigue and diaphoresis.  HENT: Negative for hearing loss, ear pain, nosebleeds, congestion, sore throat, neck pain, tinnitus and ear discharge.   Eyes: Negative for blurred vision, double vision, photophobia, pain, discharge and redness.  Respiratory: Negative for +cough, hemoptysis, sputum production, +  shortness of breath, wheezing and stridor.   Cardiovascular: Negative for chest pain, palpitations, orthopnea, claudication, leg swelling and PND.  Gastrointestinal: Negative for heartburn, +nausea, vomiting, abdominal pain, +diarrhea, constipation, blood in stool and melena.  Genitourinary: Negative for dysuria, urgency, frequency, hematuria and flank pain.  Musculoskeletal: Negative  for myalgias, +back pain, joint pain and falls.  Skin: Negative for itching and rash.  Neurological: Negative for dizziness, tingling, tremors, sensory change, speech change, focal weakness, seizures, loss of consciousness, weakness and headaches.  Endo/Heme/Allergies: Negative for environmental allergies and polydipsia. Does not bruise/bleed easily.  SUBJECTIVE:  Pt reports shortness of breath (which has improved) and intermittent dry cough Reports intermittent nausea and diarrhea, back pain Denies chest pain, abdominal pain, fever, palpitations  VITAL SIGNS: Temp:  [98.1 F (36.7 C)-99.7 F (37.6 C)] 98.4 F (36.9 C) (02/27 2125) Pulse Rate:  [78-93] 89 (02/27 2255) Resp:  [15-33] 33 (02/27 2255) BP: (70-104)/(42-73) 92/50 (02/27 2125) SpO2:  [89 %-99 %] 91 % (02/27 2255) Weight:  [83.9 kg] 83.9 kg (02/27 1434)  PHYSICAL EXAMINATION: General:  Acutely ill appearing female, laying in bed, on 10 L HFNC, in NAD Neuro:  Awake, A&O x4, follows commands, no focal deficits, speech clear HEENT:  Atraumatic, normocephalic, neck supple, no JVD Cardiovascular:  Regular rate and rhythm, 2+ pulses throughout Lungs:  Unable to auscultate due to CAPR, mild tachypnea, even, no assessory muscle use Abdomen:  Obese, soft, nontender,nondistended, no guarding or rebound tenderness Musculoskeletal:  Normal bulk and tone, no deformities, no edema Skin:  Warm and dry.  No obvious rashes, lesions, or ulcerations  Recent Labs  Lab 05/23/19 1436 05/23/19 2005  NA 133* 135  K 4.4 4.6  CL 107 115*  CO2 12* 11*  BUN 45* 41*  CREATININE 5.34* 4.06*  GLUCOSE 117* 155*   Recent Labs  Lab 05/23/19 1436  HGB 11.5*  HCT 34.9*  WBC 9.0  PLT 221   DG Chest Port 1 View  Result Date: 05/23/2019 CLINICAL DATA:  64 year old female with COVID infection, shortness of breath EXAM: PORTABLE CHEST 1 VIEW COMPARISON:  07/19/2014 FINDINGS: Cardiomediastinal silhouette unchanged in size and contour. No  pneumothorax. No pleural effusion. New reticulonodular opacities of the bilateral lungs, towards the periphery and with a lower lung predominance. No displaced fracture IMPRESSION: Multifocal pneumonia compatible with the given history. Electronically Signed   By: Corrie Mckusick D.O.   On: 05/23/2019 15:27   CT Renal Stone Study  Result Date: 05/23/2019 CLINICAL DATA:  Left flank pain. Hypotension. COVID positive. Shortness of breath. History of hepatitis B and C. EXAM: CT ABDOMEN AND PELVIS WITHOUT CONTRAST TECHNIQUE: Multidetector CT imaging of the abdomen and pelvis was performed following the standard protocol without IV contrast. COMPARISON:  Today's chest radiograph, dictated separately. 04/26/2015 renal ultrasound. FINDINGS: Lower chest: Peripheral predominant ground-glass opacities at both lung bases, consistent with COVID-19 pneumonia. 1.8 cm left lower lobe pulmonary nodule or or area of more confluent consolidation, including at 1.8 cm on 13/4. Mild cardiomegaly, without pericardial or pleural effusion. Small right cardiophrenic angle nodes are likely reactive. Hepatobiliary: Hepatic morphology, including caudate lobe enlargement and medial segment left liver lobe atrophy. Subtle irregular hepatic capsule. Normal gallbladder, without biliary ductal dilatation. Pancreas: Normal, without mass or ductal dilatation. Spleen: Normal in size, without focal abnormality. Adrenals/Urinary Tract: Normal adrenal glands. Mild renal cortical thinning bilaterally. Left renal punctate collecting system calculi and renal vascular calcifications. No hydroureter or ureteric calculi. No bladder calculi. Stomach/Bowel: Normal stomach, without wall  thickening. Normal colon, appendix, and terminal ileum. Of note, the appendix terminates in the left upper pelvis. Normal small bowel. Vascular/Lymphatic: Aortic and branch vessel atherosclerosis. No abdominopelvic adenopathy. Reproductive: Normal uterus and adnexa. Other: No  significant free fluid.  Mild pelvic floor laxity. Musculoskeletal: Probable bone island in the right sacrum. Lumbosacral spondylosis with multilevel disc bulges. IMPRESSION: 1. Left nephrolithiasis, without obstructive uropathy. 2. Bibasilar peripheral ground-glass opacities, consistent with COVID-19 pneumonia. More focal nodular opacity in the left lower lobe could represent an area of consolidation or a true pulmonary nodule. Recommend follow-up chest CT at 3 months, after the patient is recovered from the acute illness. 3. Suspicion of cirrhosis, especially given the history of hepatitis B and C. 4.  Aortic Atherosclerosis (ICD10-I70.0). Electronically Signed   By: Abigail Miyamoto M.D.   On: 05/23/2019 15:58    ASSESSMENT / PLAN:  Acute Hypoxic Respiratory Failure secondary to COVID-19 Pneumonia & severe metabolic acidosis LLL Pulmonary Nodule noted on CT  Hx: COPD -Supplemental O2 as needed to maintain O2 sats >88% -Follow intermittent CXR & ABG as needed -High risk for intubation -Bronchodilators -IV Decadron -Remdesivir is contraindicated due to severe AKI -Incentive spirometry -Encourage prone positioning as tolerated -Bicarb gtt -Follow up CT Chest in 3 months to reevaluate LLL pulmonary nodule  COVID-19 Pneumonia -Monitor fever curve -Trend WBC's & Procalcitonin -Follow cultures as above -Holding off on Remdesivir as it is contraindicated in setting of severe AKI (discussed with Dr. Lucile Shutters of [facility] who is in agreement) -Continue Decadron -Procalcitonin is minimally elevated (in setting of severe AKI), thus will hold off on empiric antibiotics for now -Trend inflammatory markers -D-dimer is elevated, consider V/Q scan is D-dimer trends upwards (unable to obtain CTA Chest due to AKI) -Urinalysis is negative for UTI -Stool negative for C-diff  AKI on CKD Severe Metabolic Acidosis  Hypomagnesemia -Monitor I&O's / urinary output -Follow BMP -Ensure adequate renal perfusion -Avoid  nephrotoxic agents as able -Replace electrolytes as indicated -IV Fluids -CT Renal stone with left kidney stone, but no evidence of obstructive uropathy or hydronephrosis -Received 3 amps of Bicarb prior to transfer to Stepdown -Continue Bicarb gtt -If Creatinine continues to trend up will need to consult Nephrology  Hypotension: hypovolemic shock +/- septic shock Mildly elevated Troponin, likely demand ischemia Hx: HTN, CHF, HLD -Continuous cardiac monitoring -Maintain MAP >65 -Received IV Fluid resuscitation (3L) -Vasopressors as needed to maintain MAP goal -Hold home antihypertensives -Trend troponin until downtrending -Obtain 2D Echocardiogram  Hyperglycemia -CBG's -SSI as needed -Follow ICU Hypo/hyperglycemia protocol            DISPOSITION: Stepdown GOALS OF CARE: Full code VTE PROPHYLAXIS: SQ Heparin UPDATES: Updated pt at bedside 05/24/19  Darel Hong, [facility] Pager: [phone]  05/23/2019, 11:34 PM

## 2019-05-23 NOTE — Progress Notes (Signed)
Received call from, primary Rn at [facility] regarding concern for hypotension post 3L of fluid with no output. SWOT Rn to room to assist. Patient also having diarrhea.x 4 days and currently this admission. Communicated to B. Randol Kern NP regarding diarrhea. Patient still with no urine output. Cdiff order placed and patient on precautions. Order to place foley for accuracy of I & O given AKI and treatment for that planned. Patient very weak and reporting some incontinence at home and inability to hold urine. External catheter in place currently until lab results available. Will place at that time per Rachael Fee NP. Oxygen needs increased from 4L to 6L. Patient placed on HFNC at 6L. Patient unable to prone but able to tolerate sidelying positioning. [place] IS and patient able to complete x 5 satisfactorily. Resting comfortable on right side. Continuing to monitor patient. BP still tachypneic but trending down. BP slightly improved. Stool sample collected and sent. Will continue to assist with monitoring this patient.

## 2019-05-23 NOTE — ED Provider Notes (Signed)
[facility]ERGENCY DEPARTMENT Provider Note   CSN: [id number] Arrival date & time: 05/23/19  1428     History Chief Complaint  Anne [name] presents with   Shortness of Breath    Anne [name] is a 64 y.o. [name] history of hep C, COPD, CHF here presenting with shortness of breath, positive Covid.  Anne [name] states that Anne [name] brother recently died of Covid.  Anne [name] was tested positive 5 days ago [facility] the [facility]. Anne [name] states that Anne [name] initially had some shortness of breath that improved but over the last 3 days Anne [name] has shortness of breath with minimal exertion. Anne [name] states that when Anne [name] gets up from bed Anne [name] gets very short of breath so has been staying in bed all the time.  Anne [name] also developed left flank pain as well.  Denies any actual fevers but has some subjective chills.  The history is provided by the Anne [name].       Past Medical History:  Diagnosis Date   Benign essential HTN 11/16/2014   Breathlessness on exertion 09/03/2013   CHF (congestive heart failure) (HCC)    COPD (chronic obstructive pulmonary disease) ([place])    Exposure to hepatitis B 12/10/2014   Heart valve disease 09/03/2013   Hepatitis C    took Harvoni. now clear.   History of cardiac catheterization 03/24/2014   Overview:  With normal coronaries 2015     Anne [name] Active Problem List   Diagnosis Date Noted   Screening for colorectal cancer    Benign neoplasm of descending colon    Obesity, Class II, BMI 35-39.9, with comorbidity 11/05/2016   Lateral epicondylitis 05/17/2016   Acute renal insufficiency 05/10/2015   Arterial blood pressure decreased 04/14/2015   Acute renal failure (ARF) ([place]) 03/28/2015   Hepatitis C 01/12/2015   Exposure to hepatitis B 12/10/2014   Benign essential HTN 11/16/2014   History of cardiac catheterization 03/24/2014   Breathlessness on exertion 09/03/2013   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     Family History  Problem Relation Age of Onset   Hypertension Mother    Arthritis Mother    Breast cancer Neg Hx     Social History   Tobacco Use   Smoking status: Never Smoker   Smokeless tobacco: Never Used  Substance Use Topics   Alcohol use: Yes    Alcohol/week: 1.0 standard drinks    Types: 1 Cans of beer per week    Comment: Occasional    Drug use: No    Home Medications Prior to Admission medications   Medication Sig Start Date End Date Taking? Authorizing Provider  albuterol (PROVENTIL HFA;VENTOLIN HFA) 108 (90 Base) MCG/ACT inhaler Inhale 2 puffs into the lungs every 6 (six) hours as needed for wheezing or shortness of breath.    [provider]  amLODipine (NORVASC) 5 MG tablet Take by mouth. 11/05/16 11/05/17  [provider]  aspirin 81 MG tablet Take 81 mg by mouth daily.  [provider]  carvedilol (COREG) 25 MG tablet Take 25 mg by mouth 2 (two) times daily with a meal.    [provider]  Fluticasone-Salmeterol (ADVAIR DISKUS) 250-50 MCG/DOSE AEPB Inhale 1 puff into the lungs 2 (two) times daily.     [provider]  Fluticasone-Salmeterol (ADVAIR) 250-50 MCG/DOSE AEPB Advair Diskus 250 mcg-50 mcg/dose powder for inhalation    [provider]  hydrochlorothiazide (HYDRODIURIL) 25 MG tablet Take 25 mg by mouth daily.    [provider]  Ledipasvir-Sofosbuvir (HARVONI) 90-400 MG TABS Take by mouth.     [provider]  losartan (COZAAR) 25 MG tablet Take 25 mg by mouth.    [provider]  meloxicam (MOBIC) 15 MG tablet Mobic 15 mg tablet  Take 1 tablet every day by oral route.    [provider]  Multiple Vitamin (MULTI-VITAMINS) TABS Take by mouth.    [provider]  omeprazole (PRILOSEC) 20 MG capsule Take 20 mg by mouth daily.     [provider]  spironolactone (ALDACTONE) 25 MG tablet spironolactone    [provider]  tiotropium (SPIRIVA) 18 MCG inhalation capsule Place 18 mcg into inhaler and inhale daily.     [provider]    Allergies    Ibuprofen, Nitrofuran derivatives, and Ribavirin  Review of Systems   Review of Systems  Respiratory: Positive for shortness of breath.   Musculoskeletal: Positive for back pain.  All other systems reviewed and are negative.   Physical Exam Updated Vital Signs BP (!) 95/55    Pulse 78    Temp 99.7 F (37.6 C) (Oral)    Resp (!) 30    Ht 5\' 3"  (1.6 m)    Wt 83.9 kg    SpO2 97%    BMI 32.77 kg/m   Physical Exam Vitals and nursing note reviewed.  Constitutional:      Comments: Ill appearing   HENT:     Head: Normocephalic.  Eyes:     Pupils: Pupils are equal, round, and reactive to light.  Cardiovascular:     Rate and Rhythm: Normal rate and regular rhythm.  Pulmonary:     Comments: Dry crackles bilateral bases  Abdominal:     Comments: + L CVAT   Musculoskeletal:        General: Normal range of motion.     Cervical back: Normal range of motion and neck supple.  Skin:    General: Skin is warm.     Capillary Refill: Capillary refill takes less than 2 seconds.  Neurological:     General: No focal deficit present.     Mental Status: Anne [name] is oriented to person, place, and time.  Psychiatric:        Mood and Affect: Mood normal.        Behavior: Behavior normal.     ED Results / Procedures / Treatments   Labs (all labs ordered are listed, but only abnormal  results are displayed) Labs Reviewed  CBC WITH DIFFERENTIAL/PLATELET - Abnormal; Notable for the following components:      Result Value   Hemoglobin 11.5 (*)    HCT 34.9 (*)    All other components within normal limits  COMPREHENSIVE METABOLIC PANEL - Abnormal; Notable for the following components:   Sodium 133 (*)    CO2 12 (*)    Glucose, Bld 117 (*)    BUN 45 (*)    Creatinine, Ser 5.34 (*)  Calcium 8.8 (*)    Total Protein 8.4 (*)    GFR calc non Af Amer 8 (*)    GFR calc Af Amer 9 (*)    All other components within normal limits  LACTATE DEHYDROGENASE - Abnormal; Notable for the following components:   LDH 256 (*)    All other components within normal limits  FERRITIN - Abnormal; Notable for the following components:   Ferritin 817 (*)    All other components within normal limits  TRIGLYCERIDES - Abnormal; Notable for the following components:   Triglycerides 164 (*)    All other components within normal limits  FIBRINOGEN - Abnormal; Notable for the following components:   Fibrinogen 711 (*)    All other components within normal limits  FIBRIN DERIVATIVES D-DIMER ([facility] ONLY) - Abnormal; Notable for the following components:   Fibrin derivatives D-dimer ([facility]) 989.47 (*)    All other components within normal limits  POC SARS CORONAVIRUS 2 AG - Abnormal; Notable for the following components:   SARS Coronavirus 2 Ag POSITIVE (*)    All other components within normal limits  TROPONIN I (HIGH SENSITIVITY) - Abnormal; Notable for the following components:   Troponin I (High Sensitivity) 27 (*)    All other components within normal limits  CULTURE, BLOOD (ROUTINE X 2)  CULTURE, BLOOD (ROUTINE X 2)  LACTIC ACID, PLASMA  PROCALCITONIN  BRAIN NATRIURETIC PEPTIDE  LACTIC ACID, PLASMA  C-REACTIVE PROTEIN  URINALYSIS, COMPLETE (UACMP) WITH MICROSCOPIC  POC SARS CORONAVIRUS 2 AG -  ED  TROPONIN I (HIGH SENSITIVITY)    EKG EKG Interpretation  Date/Time:  Saturday  May 23 2019 14:46:10 EST Ventricular Rate:  80 PR Interval:    QRS Duration: 101 QT Interval:  348 QTC Calculation: 402 R Axis:   53 Text Interpretation: Sinus rhythm Low voltage, precordial leads No significant change since last tracing Confirmed by Wandra Arthurs [id number]) on 05/23/2019 3:02:00 PM   Radiology DG Chest Port 1 View  Result Date: 05/23/2019 CLINICAL DATA:  64 year old [name] with COVID infection, shortness of breath EXAM: PORTABLE CHEST 1 VIEW COMPARISON:  07/19/2014 FINDINGS: Cardiomediastinal silhouette unchanged in size and contour. No pneumothorax. No pleural effusion. New reticulonodular opacities of the bilateral lungs, towards the periphery and with a lower lung predominance. No displaced fracture IMPRESSION: Multifocal pneumonia compatible with the given history. Electronically Signed   By: Corrie Mckusick D.O.   On: 05/23/2019 15:27   CT Renal Stone Study  Result Date: 05/23/2019 CLINICAL DATA:  Left flank pain. Hypotension. COVID positive. Shortness of breath. History of hepatitis B and C. EXAM: CT ABDOMEN AND PELVIS WITHOUT CONTRAST TECHNIQUE: Multidetector CT imaging of the abdomen and pelvis was performed following the standard protocol without IV contrast. COMPARISON:  Today's chest radiograph, dictated separately. 04/26/2015 renal ultrasound. FINDINGS: Lower chest: Peripheral predominant ground-glass opacities at both lung bases, consistent with COVID-19 pneumonia. 1.8 cm left lower lobe pulmonary nodule or or area of more confluent consolidation, including at 1.8 cm on 13/4. Mild cardiomegaly, without pericardial or pleural effusion. Small right cardiophrenic angle nodes are likely reactive. Hepatobiliary: Hepatic morphology, including caudate lobe enlargement and medial segment left liver lobe atrophy. Subtle irregular hepatic capsule. Normal gallbladder, without biliary ductal dilatation. Pancreas: Normal, without mass or ductal dilatation. Spleen: Normal in size,  without focal abnormality. Adrenals/Urinary Tract: Normal adrenal glands. Mild renal cortical thinning bilaterally. Left renal punctate collecting system calculi and renal vascular calcifications. No hydroureter or ureteric calculi. No bladder calculi. Stomach/Bowel: Normal  stomach, without wall thickening. Normal colon, appendix, and terminal ileum. Of note, the appendix terminates in the left upper pelvis. Normal small bowel. Vascular/Lymphatic: Aortic and branch vessel atherosclerosis. No abdominopelvic adenopathy. Reproductive: Normal uterus and adnexa. Other: No significant free fluid.  Mild pelvic floor laxity. Musculoskeletal: Probable bone island in the right sacrum. Lumbosacral spondylosis with multilevel disc bulges. IMPRESSION: 1. Left nephrolithiasis, without obstructive uropathy. 2. Bibasilar peripheral ground-glass opacities, consistent with COVID-19 pneumonia. More focal nodular opacity in the left lower lobe could represent an area of consolidation or a true pulmonary nodule. Recommend follow-up chest CT at 3 months, after the Anne [name] is recovered from the acute illness. 3. Suspicion of cirrhosis, especially given the history of hepatitis B and C. 4.  Aortic Atherosclerosis (ICD10-I70.0). Electronically Signed   By: Abigail Miyamoto M.D.   On: 05/23/2019 15:58    Procedures Procedures (including critical care time)  CRITICAL CARE Performed by: Wandra Arthurs   Total critical care time:30  minutes  Critical care time was exclusive of separately billable procedures and treating other patients.  Critical care was necessary to treat or prevent imminent or life-threatening deterioration.  Critical care was time spent personally by me on the following activities: development of treatment plan with Anne [name] and/or surrogate as well as nursing, discussions with consultants, evaluation of Anne [name]'s response to treatment, examination of Anne [name], obtaining history from Anne [name] or surrogate, ordering and  performing treatments and interventions, ordering and review of laboratory studies, ordering and review of radiographic studies, pulse oximetry and re-evaluation of Anne [name]'s condition.   Medications Ordered in ED Medications  sodium chloride 0.9 % bolus 1,000 mL (has no administration in time range)  sodium chloride 0.9 % bolus 1,000 mL (0 mLs Intravenous Stopped 05/23/19 1619)  dexamethasone (DECADRON) injection 8 mg (8 mg Intravenous Given 05/23/19 1500)  albuterol (VENTOLIN HFA) 108 (90 Base) MCG/ACT inhaler 2 puff (2 puffs Inhalation Given 05/23/19 1602)  sodium chloride 0.9 % bolus 1,000 mL (1,000 mLs Intravenous New Bag/Given 05/23/19 1601)    ED Course  I have reviewed the triage vital signs and the nursing notes.  Pertinent labs & imaging results that were available during my care of the Anne [name] were reviewed by me and considered in my medical decision making (see chart for details).    MDM Rules/Calculators/A&P                      Brona Finner is a 64 y.o. [name] here presenting with left flank pain, shortness of breath.  Anne [name] tested positive for Covid several days ago Anne [name] is hypoxic in triage.  Anne [name] is also hypotensive as well.  Will get [facility] preadmission labs and hydrate Anne [name].   4:48 PM COVID positive. Has acute renal failure with Cr now at 5. Has L kidney stone but no hydro. I think renal failure likely from dehydration vs COVID. Anne [name]'s BP improved to 90s from 70s after bolus and hasn't urinated. Will order 3rd liter bolus. No obvious bacterial source of hypotension so will hold off on abx. Hospitalist to admit.   Final Clinical Impression(s) / ED Diagnoses Final diagnoses:  None    Rx / DC Orders ED Discharge Orders    None       Drenda Freeze, MD 05/23/19 1649

## 2019-05-23 NOTE — Plan of Care (Signed)
Pt going to ICU-6. Tried to call report to Legrand Como but he was with another pt.

## 2019-05-23 NOTE — H&P (Addendum)
History and Physical    Anne [name] [id number] DOB: 03/10/1956 DOA: 05/23/2019  PCP: Freddy Finner, NP Patient coming from: home    Chief Complaint: shortness of breath   HPI: 64 y/o F w/ PMH of COPD, HTN, HCV s/p treatment, CHF, HLD, likely CKD who presents w/ shortness of breath x 4 days. The shortness of breath is at rest as well as with exertion. Pt also c/o subjective fevers, chills, muscle aches & diarrhea. Pt denies any bloody or black tarry stools.  Pt's known contact for COVID19 was her brother who just passed away of [place]. Pt denies any dizziness/lightheadedness, chest pain, nausea, vomiting, abd pain, dysuria, urinary frequency, or constipation.   Review of Systems: As per HPI otherwise 10 point review of systems negative.    Past Medical History:  Diagnosis Date  . Benign essential HTN 11/16/2014  . Breathlessness on exertion 09/03/2013  . CHF (congestive heart failure) ([place])   . COPD (chronic obstructive pulmonary disease) ([place])   . Exposure to hepatitis B 12/10/2014  . Heart valve disease 09/03/2013  . Hepatitis C    took Harvoni. now clear.  Marland Kitchen History of cardiac catheterization 03/24/2014   Overview:  With normal coronaries 2015     Past Surgical History:  Procedure Laterality Date  . COLONOSCOPY WITH PROPOFOL N/A 12/10/2016   Procedure: COLONOSCOPY WITH PROPOFOL;  Surgeon: Lucilla Lame, MD;  Location: [facility];  Service: Gastroenterology;  Laterality: N/A;  . POLYPECTOMY N/A 12/10/2016   Procedure: POLYPECTOMY;  Surgeon: Lucilla Lame, MD;  Location: [facility];  Service: Gastroenterology;  Laterality: N/A;  . TUBAL LIGATION       reports that she has never smoked. She has never used smokeless tobacco. She reports current alcohol use of about 1.0 standard drinks of alcohol per week. She reports that she does not use drugs.  Allergies  Allergen Reactions  . Ibuprofen Other (See Comments)    Reaction:  Unknown   . Nitrofuran  Derivatives Other (See Comments)    Reaction:  Unknown   . Ribavirin Nausea And Vomiting    Family History  Problem Relation Age of Onset  . Hypertension Mother   . Arthritis Mother   . Breast cancer Neg Hx      Prior to Admission medications   Medication Sig Start Date End Date Taking? Authorizing Provider  albuterol (PROVENTIL HFA;VENTOLIN HFA) 108 (90 Base) MCG/ACT inhaler Inhale 2 puffs into the lungs every 6 (six) hours as needed for wheezing or shortness of breath.    [provider]  amLODipine (NORVASC) 5 MG tablet Take by mouth. 11/05/16 11/05/17  [provider]  aspirin 81 MG tablet Take 81 mg by mouth daily.    [provider]  carvedilol (COREG) 25 MG tablet Take 25 mg by mouth 2 (two) times daily with a meal.    [provider]  Fluticasone-Salmeterol (ADVAIR DISKUS) 250-50 MCG/DOSE AEPB Inhale 1 puff into the lungs 2 (two) times daily.     [provider]  Fluticasone-Salmeterol (ADVAIR) 250-50 MCG/DOSE AEPB Advair Diskus 250 mcg-50 mcg/dose powder for inhalation    [provider]  hydrochlorothiazide (HYDRODIURIL) 25 MG tablet Take 25 mg by mouth daily.    [provider]  Ledipasvir-Sofosbuvir (HARVONI) 90-400 MG TABS Take by mouth.    [provider]  losartan (COZAAR) 25 MG tablet Take 25 mg by mouth.    [provider]  meloxicam (MOBIC) 15 MG tablet Mobic 15 mg tablet  Take 1 tablet every day by oral route.    [provider]  Multiple Vitamin (MULTI-VITAMINS) TABS Take by mouth.    [provider]  omeprazole (PRILOSEC) 20 MG capsule Take 20 mg by mouth daily.     [provider]  spironolactone (ALDACTONE) 25 MG tablet spironolactone    [provider]  tiotropium (SPIRIVA) 18 MCG inhalation capsule Place 18 mcg into inhaler and inhale daily.     [provider]    Physical Exam: Vitals:   05/23/19 1447 05/23/19 1513 05/23/19 1600  05/23/19 1619  BP: (!) 93/52 (!) 78/57 (!) 88/53 (!) 95/55  Pulse:  84  78  Resp: (!) 25 17 (!) 33 (!) 30  Temp:      TempSrc:      SpO2:  97% 97% 97%  Weight:      Height:        Constitutional: NAD, calm, comfortable Vitals:   05/23/19 1447 05/23/19 1513 05/23/19 1600 05/23/19 1619  BP: (!) 93/52 (!) 78/57 (!) 88/53 (!) 95/55  Pulse:  84  78  Resp: (!) 25 17 (!) 33 (!) 30  Temp:      TempSrc:      SpO2:  97% 97% 97%  Weight:      Height:       Eyes: PERRL, lids and conjunctivae normal ENMT: Mucous membranes are dry. Posterior pharynx clear of any exudate or lesions. Neck: normal, supple. Respiratory: diminished breath sounds b/l. No wheezes or rales. Increased RR  Cardiovascular: S1, S2 +. no rubs / gallops. No extremity edema. Abdomen: Soft, NT, obese, hypoactive bowel sounds Musculoskeletal: no clubbing / cyanosis. No joint deformity upper and lower extremities. Good ROM Skin: no rashes, lesions, ulcers Neurologic: CN 2-12 grossly intact.  Strength 5/5 in all 4.  Psychiatric: Normal judgment and insight. Alert and oriented x 3. Flat mood and affect     Labs on Admission: I have personally reviewed following labs and imaging studies  CBC: Recent Labs  Lab 05/23/19 1436  WBC 9.0  NEUTROABS 6.9  HGB 11.5*  HCT 34.9*  MCV 87.0  PLT [id number]   Basic Metabolic Panel: Recent Labs  Lab 05/23/19 1436  NA 133*  K 4.4  CL 107  CO2 12*  GLUCOSE 117*  BUN 45*  CREATININE 5.34*  CALCIUM 8.8*   GFR: Estimated Creatinine Clearance: 11.2 mL/min (A) (by C-G formula based on SCr of 5.34 mg/dL (H)). Liver Function Tests: Recent Labs  Lab 05/23/19 1436  AST 37  ALT 25  ALKPHOS 63  BILITOT 0.6  PROT 8.4*  ALBUMIN 3.9   No results for input(s): LIPASE, AMYLASE in the last 168 hours. No results for input(s): AMMONIA in the last 168 hours. Coagulation Profile: No results for input(s): INR, PROTIME in the last 168 hours. Cardiac Enzymes: No results for input(s):  CKTOTAL, CKMB, CKMBINDEX, TROPONINI in the last 168 hours. BNP (last 3 results) No results for input(s): PROBNP in the last 8760 hours. HbA1C: No results for input(s): HGBA1C in the last 72 hours. CBG: No results for input(s): GLUCAP in the last 168 hours. Lipid Profile: Recent Labs    05/23/19 1436  TRIG 164*   Thyroid Function Tests: No results for input(s): TSH, T4TOTAL, FREET4, T3FREE, THYROIDAB in the last 72 hours. Anemia Panel: Recent Labs    05/23/19 1436  FERRITIN 817*   Urine analysis:    Component Value Date/Time   COLORURINE YELLOW (A) 03/28/2015 2251   APPEARANCEUR  HAZY (A) 03/28/2015 2251   APPEARANCEUR Hazy 06/07/2011 1439   LABSPEC 1.008 03/28/2015 2251   LABSPEC 1.006 06/07/2011 1439   PHURINE 5.0 03/28/2015 2251   GLUCOSEU NEGATIVE 03/28/2015 2251   GLUCOSEU Negative 06/07/2011 1439   HGBUR NEGATIVE 03/28/2015 2251   BILIRUBINUR NEGATIVE 03/28/2015 2251   BILIRUBINUR Negative 06/07/2011 1439   KETONESUR NEGATIVE 03/28/2015 2251   PROTEINUR NEGATIVE 03/28/2015 2251   NITRITE NEGATIVE 03/28/2015 2251   LEUKOCYTESUR 2+ (A) 03/28/2015 2251   LEUKOCYTESUR Negative 06/07/2011 1439    Radiological Exams on Admission: DG Chest Port 1 View  Result Date: 05/23/2019 CLINICAL DATA:  64 year old female with COVID infection, shortness of breath EXAM: PORTABLE CHEST 1 VIEW COMPARISON:  07/19/2014 FINDINGS: Cardiomediastinal silhouette unchanged in size and contour. No pneumothorax. No pleural effusion. New reticulonodular opacities of the bilateral lungs, towards the periphery and with a lower lung predominance. No displaced fracture IMPRESSION: Multifocal pneumonia compatible with the given history. Electronically Signed   By: Corrie Mckusick D.O.   On: 05/23/2019 15:27   CT Renal Stone Study  Result Date: 05/23/2019 CLINICAL DATA:  Left flank pain. Hypotension. COVID positive. Shortness of breath. History of hepatitis B and C. EXAM: CT ABDOMEN AND PELVIS WITHOUT  CONTRAST TECHNIQUE: Multidetector CT imaging of the abdomen and pelvis was performed following the standard protocol without IV contrast. COMPARISON:  Today's chest radiograph, dictated separately. 04/26/2015 renal ultrasound. FINDINGS: Lower chest: Peripheral predominant ground-glass opacities at both lung bases, consistent with COVID-19 pneumonia. 1.8 cm left lower lobe pulmonary nodule or or area of more confluent consolidation, including at 1.8 cm on 13/4. Mild cardiomegaly, without pericardial or pleural effusion. Small right cardiophrenic angle nodes are likely reactive. Hepatobiliary: Hepatic morphology, including caudate lobe enlargement and medial segment left liver lobe atrophy. Subtle irregular hepatic capsule. Normal gallbladder, without biliary ductal dilatation. Pancreas: Normal, without mass or ductal dilatation. Spleen: Normal in size, without focal abnormality. Adrenals/Urinary Tract: Normal adrenal glands. Mild renal cortical thinning bilaterally. Left renal punctate collecting system calculi and renal vascular calcifications. No hydroureter or ureteric calculi. No bladder calculi. Stomach/Bowel: Normal stomach, without wall thickening. Normal colon, appendix, and terminal ileum. Of note, the appendix terminates in the left upper pelvis. Normal small bowel. Vascular/Lymphatic: Aortic and branch vessel atherosclerosis. No abdominopelvic adenopathy. Reproductive: Normal uterus and adnexa. Other: No significant free fluid.  Mild pelvic floor laxity. Musculoskeletal: Probable bone island in the right sacrum. Lumbosacral spondylosis with multilevel disc bulges. IMPRESSION: 1. Left nephrolithiasis, without obstructive uropathy. 2. Bibasilar peripheral ground-glass opacities, consistent with COVID-19 pneumonia. More focal nodular opacity in the left lower lobe could represent an area of consolidation or a true pulmonary nodule. Recommend follow-up chest CT at 3 months, after the patient is recovered from  the acute illness. 3. Suspicion of cirrhosis, especially given the history of hepatitis B and C. 4.  Aortic Atherosclerosis (ICD10-I70.0). Electronically Signed   By: Abigail Miyamoto M.D.   On: 05/23/2019 15:58    EKG: Independently reviewed.  Assessment/Plan Active Problems:   * No active hospital problems. *   COVID19 pneumonia: will start zinc, vitamin c & continue on steroids. Remdesivir is contraindicated secondary to significant AKI. Continue on airborne & contact precautions. Inflammatory markers are elevated. D-dimer is elevated but will hold off on CTA chest at this time secondary to significant AKI, consider V/Q scan if d-dimer continues to rise. Continue on supplemental oxygen and wean as tolerated. Encourage incentive spirometry. Will start bronchodilators. Encourage prone positioning. CT shows possible left  lower lobe pulmon nodule so radiology recommends f/u w/ CT chest in 3 months after the pt has recovered from acute illness. Pt verbalized her understanding  Acute hypoxic respiratory failure: secondary to [place]. Management as stated above  Likely AKI on CKD: baseline Cr/GFR is unknown. Pt did have HD x 1 after taking some medication in the past as per pt. Possibly secondary to [place]. Continue on IVFs. Avoid nephrotoxic meds. Consider nephro consult if Cr continues to trend up. Will continue to monitor   COPD: continue on home bronchodilators or hospital substitutes   HTN: will hold home dose of losartan, spironolactone,carvedilol, HCTZ amlodipine secondary to hypotension. Will reassess in AM  Hyperglycemia: no hx of DM. Will continue to monitor  GERD: continue on PPI   Hx of HCV: completed course of harvoni. CT abd/pelvis shows suspicion of cirrhosis. Outpatient GI f/u is recommended. Pt verbalized her understanding    DVT prophylaxis: sq heparin Code Status: full Family Communication: discussed pt's care w/ pt's son, [name] [phone]) and answered his  questions Disposition Plan: likely will need several more days as pt clinically improves  Consults called: none Admission status: inpatient   Wyvonnia Dusky MD [facility] Pager [phone]   If 7PM-7AM, please contact night-coverage [email or web address]   05/23/2019, 4:44 PM

## 2019-05-23 NOTE — Progress Notes (Addendum)
Patient able to reach her daughter by phone. She was very upset and unable to speak.Patient recently lost brother to covid and mother to other health problems in last month. Patient reached her son [name] by phone and I gave update while at bedside. Son made aware that patient would be transferring to ICU. Did not have room number at this time to provide son. Son states that if we call and do not get an answer please leave a message. He is HOH and will sometimes not hear his phone. Son also very concerned about mother. Son informed that he would receive a call with updates from staff and he could call patient in her room to check on her as well. Son verbalized understanding and very appreciate of care being provided.    Addendum: 2305: Patient transferred from 1C to ICU.

## 2019-05-23 NOTE — Progress Notes (Addendum)
OVERNIGHT EVENTS  Patient admitted with Acute respiratory failure secondary to COVID 19 pneumonia, acute kidney injury, hypotension and noteed severe metabolic acidosis. (CO2 12) with review of  labs, and diagnostics current and per history.  Acidosis likely from dehydration as patient reported several days of diarrhea, malaise and likely decreased oral intake. Given severe renal function, will recheck BMP and initiate bicarb therapy if severe acidosis remains as she does still remain slightly relatively hypotensive in patient with severe hypertension at baseline   With arrival to 1C nursing unit, patient with increasing oxygen requirements up to 6L HFNC  CO2 on BMP at 11, 3 amps sodium bicarb ordered and bicarb continuous infusion initiated at low volume secondary to concern of volume status with COVID infection.  Mag, 1.6 and replacement of 2 grams ordered.  Foley ordered for strict I & O   With need for bicarb infusion , nursing staff felt closer monitoring needed and patient was transferred to stepdown unit.  Darel Hong NP request consult with   On my assessment, patient alert and orient, CN II - XII in tact, A &  O times four, SR on monitor, S1, S2, unable to discern lung sounds secondary to environmental noise interference.  Discussed with patient plan of care and needed interventions prior to transfer  Initially improved pressures with bicarb infusion but decrease around 1 am. Vasopressor therapy initiated per critical care NP  Repeat BMP with bicarb now at 18 and creatinine  now at 3.29, bicarb infusion rate recently increased just prior to BMP colllection to 150 cc/h from 50. Continue current rate and recheck chem panel in 6 hours

## 2019-05-23 NOTE — Consult Note (Signed)
Pharmacy consulted for correct dosing for lovenox given CrCl  and elevated d-dimer (989.47) in COVID-19 positive patient. D/w Dr. Jimmye Norman using SubQ Heparin 5000 units Q8H.   Thank you for allowing pharmacy to be a part of this patient's care.  Kristeen Miss, PharmD Clinical Pharmacist

## 2019-05-24 ENCOUNTER — Inpatient Hospital Stay: Admit: 2019-05-24 | Payer: Medicaid Other

## 2019-05-24 ENCOUNTER — Inpatient Hospital Stay: Payer: Medicaid Other

## 2019-05-24 DIAGNOSIS — R0603 Acute respiratory distress: Secondary | ICD-10-CM

## 2019-05-24 LAB — BASIC METABOLIC PANEL
Anion gap: 10 (ref 5–15)
Anion gap: 11 (ref 5–15)
Anion gap: 10 (ref 5–15)
BUN: 37 mg/dL — ABNORMAL HIGH (ref 8–23)
BUN: 28 mg/dL — ABNORMAL HIGH (ref 8–23)
BUN: 22 mg/dL (ref 8–23)
CO2: 18 mmol/L — ABNORMAL LOW (ref 22–32)
CO2: 22 mmol/L (ref 22–32)
CO2: 26 mmol/L (ref 22–32)
Calcium: 7.7 mg/dL — ABNORMAL LOW (ref 8.9–10.3)
Calcium: 7.6 mg/dL — ABNORMAL LOW (ref 8.9–10.3)
Calcium: 7.3 mg/dL — ABNORMAL LOW (ref 8.9–10.3)
Chloride: 113 mmol/L — ABNORMAL HIGH (ref 98–111)
Chloride: 109 mmol/L (ref 98–111)
Chloride: 108 mmol/L (ref 98–111)
Creatinine, Ser: 3.29 mg/dL — ABNORMAL HIGH (ref 0.44–1.00)
Creatinine, Ser: 2 mg/dL — ABNORMAL HIGH (ref 0.44–1.00)
Creatinine, Ser: 1.79 mg/dL — ABNORMAL HIGH (ref 0.44–1.00)
GFR calc Af Amer: 17 mL/min — ABNORMAL LOW (ref >60)
GFR calc Af Amer: 30 mL/min — ABNORMAL LOW (ref >60)
GFR calc Af Amer: 35 mL/min — ABNORMAL LOW (ref >60)
GFR calc non Af Amer: 14 mL/min — ABNORMAL LOW (ref >60)
GFR calc non Af Amer: 26 mL/min — ABNORMAL LOW (ref >60)
GFR calc non Af Amer: 30 mL/min — ABNORMAL LOW (ref >60)
Glucose, Bld: 144 mg/dL — ABNORMAL HIGH (ref 70–99)
Glucose, Bld: 117 mg/dL — ABNORMAL HIGH (ref 70–99)
Glucose, Bld: 152 mg/dL — ABNORMAL HIGH (ref 70–99)
Potassium: 3.9 mmol/L (ref 3.5–5.1)
Potassium: 4.1 mmol/L (ref 3.5–5.1)
Potassium: 4 mmol/L (ref 3.5–5.1)
Sodium: 141 mmol/L (ref 135–145)
Sodium: 142 mmol/L (ref 135–145)
Sodium: 144 mmol/L (ref 135–145)

## 2019-05-24 LAB — CBC
HCT: 31.1 % — ABNORMAL LOW (ref 36.0–46.0)
HCT: 31.9 % — ABNORMAL LOW (ref 36.0–46.0)
Hemoglobin: 10.7 g/dL — ABNORMAL LOW (ref 12.0–15.0)
Hemoglobin: 11 g/dL — ABNORMAL LOW (ref 12.0–15.0)
MCH: 29.1 pg (ref 26.0–34.0)
MCH: 29.2 pg (ref 26.0–34.0)
MCHC: 34.4 g/dL (ref 30.0–36.0)
MCHC: 34.5 g/dL (ref 30.0–36.0)
MCV: 84.5 fL (ref 80.0–100.0)
MCV: 84.6 fL (ref 80.0–100.0)
Platelets: 232 10*3/uL (ref 150–400)
Platelets: 326 10*3/uL (ref 150–400)
RBC: 3.68 MIL/uL — ABNORMAL LOW (ref 3.87–5.11)
RBC: 3.77 MIL/uL — ABNORMAL LOW (ref 3.87–5.11)
RDW: 13.2 % (ref 11.5–15.5)
RDW: 13.2 % (ref 11.5–15.5)
WBC: 7.8 10*3/uL (ref 4.0–10.5)
WBC: 17.6 10*3/uL — ABNORMAL HIGH (ref 4.0–10.5)
nRBC: 0 % (ref 0.0–0.2)
nRBC: 0 % (ref 0.0–0.2)

## 2019-05-24 LAB — MRSA PCR SCREENING: MRSA by PCR: POSITIVE — AB

## 2019-05-24 LAB — BLOOD GAS, ARTERIAL
Acid-Base Excess: 1.5 mmol/L (ref 0.0–2.0)
Bicarbonate: 25.9 mmol/L (ref 20.0–28.0)
FIO2: 1
MECHVT: 400 mL
O2 Saturation: 95.1 %
PEEP: 15 cmH2O
Patient temperature: 37
RATE: 28 resp/min
pCO2 arterial: 39 mmHg (ref 32.0–48.0)
pH, Arterial: 7.43 (ref 7.350–7.450)
pO2, Arterial: 74 mmHg — ABNORMAL LOW (ref 83.0–108.0)

## 2019-05-24 LAB — STREP PNEUMONIAE URINARY ANTIGEN: Strep Pneumo Urinary Antigen: NEGATIVE

## 2019-05-24 LAB — COMPREHENSIVE METABOLIC PANEL
ALT: 42 U/L (ref 0–44)
AST: 62 U/L — ABNORMAL HIGH (ref 15–41)
Albumin: 2.7 g/dL — ABNORMAL LOW (ref 3.5–5.0)
Alkaline Phosphatase: 57 U/L (ref 38–126)
Anion gap: 9 (ref 5–15)
BUN: 32 mg/dL — ABNORMAL HIGH (ref 8–23)
CO2: 22 mmol/L (ref 22–32)
Calcium: 7.8 mg/dL — ABNORMAL LOW (ref 8.9–10.3)
Chloride: 110 mmol/L (ref 98–111)
Creatinine, Ser: 2.54 mg/dL — ABNORMAL HIGH (ref 0.44–1.00)
GFR calc Af Amer: 23 mL/min — ABNORMAL LOW (ref >60)
GFR calc non Af Amer: 20 mL/min — ABNORMAL LOW (ref >60)
Glucose, Bld: 119 mg/dL — ABNORMAL HIGH (ref 70–99)
Potassium: 3.8 mmol/L (ref 3.5–5.1)
Sodium: 141 mmol/L (ref 135–145)
Total Bilirubin: 0.8 mg/dL (ref 0.3–1.2)
Total Protein: 6.9 g/dL (ref 6.5–8.1)

## 2019-05-24 LAB — PROCALCITONIN: Procalcitonin: <0.1 ng/mL

## 2019-05-24 LAB — FIBRIN DERIVATIVES D-DIMER (ARMC ONLY): Fibrin derivatives D-dimer (ARMC): 1333.78 ng/mL (FEU) — ABNORMAL HIGH (ref 0.00–499.00)

## 2019-05-24 LAB — HIV ANTIBODY (ROUTINE TESTING W REFLEX): HIV Screen 4th Generation wRfx: NONREACTIVE

## 2019-05-24 LAB — GLUCOSE, CAPILLARY: Glucose-Capillary: 153 mg/dL — ABNORMAL HIGH (ref 70–99)

## 2019-05-24 LAB — TRIGLYCERIDES: Triglycerides: 177 mg/dL — ABNORMAL HIGH (ref <150)

## 2019-05-24 MED ORDER — SODIUM CHLORIDE 0.9 % IV SOLN
100.0000 mg | Freq: Every day | INTRAVENOUS | Status: DC
  Administered 2019-05-25 – 2019-05-27 (×3): 100 mg via INTRAVENOUS
  Filled 2019-05-24 (×2): qty 100
  Filled 2019-05-24: qty 20

## 2019-05-24 MED ORDER — VECURONIUM BROMIDE 10 MG IV SOLR
INTRAVENOUS | Status: AC
  Filled 2019-05-24: qty 10

## 2019-05-24 MED ORDER — MORPHINE SULFATE (PF) 2 MG/ML IV SOLN
2.0000 mg | Freq: Once | INTRAVENOUS | Status: AC
  Administered 2019-05-24: 19:00:00 2 mg via INTRAVENOUS

## 2019-05-24 MED ORDER — NOREPINEPHRINE 4 MG/250ML-% IV SOLN: 16.0000 ug/min | INTRAVENOUS | Status: DC

## 2019-05-24 MED ORDER — ETOMIDATE 2 MG/ML IV SOLN
INTRAVENOUS | Status: AC
  Administered 2019-05-24: 22:00:00 20 mg
  Filled 2019-05-24: qty 10

## 2019-05-24 MED ORDER — FUROSEMIDE 10 MG/ML IJ SOLN: 20.0000 mg | Freq: Once | INTRAMUSCULAR | Status: AC

## 2019-05-24 MED ORDER — PROPOFOL 1000 MG/100ML IV EMUL
5.0000 ug/kg/min | INTRAVENOUS | Status: DC
  Administered 2019-05-24 – 2019-05-25 (×4): 50 ug/kg/min via INTRAVENOUS
  Administered 2019-05-25: 45 ug/kg/min via INTRAVENOUS
  Administered 2019-05-25: 12:00:00 50 ug/kg/min via INTRAVENOUS
  Administered 2019-05-25: 20:00:00 35 ug/kg/min via INTRAVENOUS
  Administered 2019-05-26 (×3): 30 ug/kg/min via INTRAVENOUS
  Filled 2019-05-24 (×10): qty 100

## 2019-05-24 MED ORDER — FENTANYL CITRATE (PF) 100 MCG/2ML IJ SOLN: 50.0000 ug | Freq: Once | INTRAMUSCULAR | Status: DC

## 2019-05-24 MED ORDER — VASOPRESSIN 20 UNIT/ML IV SOLN
0.0300 [IU]/min | INTRAVENOUS | Status: DC
  Administered 2019-05-24 – 2019-05-26 (×3): 0.03 [IU]/min via INTRAVENOUS
  Filled 2019-05-24 (×3): qty 2

## 2019-05-24 MED ORDER — FENTANYL 2500MCG IN NS 250ML (10MCG/ML) PREMIX INFUSION
INTRAVENOUS | Status: AC
  Filled 2019-05-24: qty 250

## 2019-05-24 MED ORDER — MIDAZOLAM HCL 2 MG/2ML IJ SOLN
2.0000 mg | INTRAMUSCULAR | Status: AC | PRN
  Administered 2019-05-24 – 2019-05-25 (×3): 2 mg via INTRAVENOUS
  Filled 2019-05-24 (×2): qty 2

## 2019-05-24 MED ORDER — DIPHENHYDRAMINE HCL 50 MG/ML IJ SOLN
INTRAMUSCULAR | Status: AC
  Administered 2019-05-24: 25 mg via INTRAVENOUS
  Filled 2019-05-24: qty 1

## 2019-05-24 MED ORDER — DEXMEDETOMIDINE HCL IN NACL 400 MCG/100ML IV SOLN
0.4000 ug/kg/h | INTRAVENOUS | Status: DC
  Administered 2019-05-24: 20:00:00 0.4 ug/kg/h via INTRAVENOUS

## 2019-05-24 MED ORDER — DIPHENHYDRAMINE HCL 50 MG/ML IJ SOLN: 25.0000 mg | Freq: Once | INTRAMUSCULAR | Status: AC

## 2019-05-24 MED ORDER — ETOMIDATE 2 MG/ML IV SOLN
INTRAVENOUS | Status: AC
  Filled 2019-05-24: qty 10

## 2019-05-24 MED ORDER — FENTANYL CITRATE (PF) 100 MCG/2ML IJ SOLN
INTRAMUSCULAR | Status: AC
  Filled 2019-05-24: qty 2

## 2019-05-24 MED ORDER — FUROSEMIDE 10 MG/ML IJ SOLN
INTRAMUSCULAR | Status: AC
  Administered 2019-05-24: 20 mg via INTRAVENOUS
  Filled 2019-05-24: qty 2

## 2019-05-24 MED ORDER — PROPOFOL 1000 MG/100ML IV EMUL
INTRAVENOUS | Status: AC
  Filled 2019-05-24: qty 100

## 2019-05-24 MED ORDER — SODIUM CHLORIDE 0.9 % IV SOLN
200.0000 mg | Freq: Once | INTRAVENOUS | Status: AC
  Administered 2019-05-24: 13:00:00 200 mg via INTRAVENOUS
  Filled 2019-05-24: qty 200

## 2019-05-24 MED ORDER — FAMOTIDINE IN NACL 20-0.9 MG/50ML-% IV SOLN
20.0000 mg | Freq: Two times a day (BID) | INTRAVENOUS | Status: DC
  Administered 2019-05-25 – 2019-05-27 (×6): 20 mg via INTRAVENOUS
  Filled 2019-05-24 (×6): qty 50

## 2019-05-24 MED ORDER — FUROSEMIDE 10 MG/ML IJ SOLN
20.0000 mg | Freq: Once | INTRAMUSCULAR | Status: AC
  Administered 2019-05-24: 23:00:00 20 mg via INTRAVENOUS

## 2019-05-24 MED ORDER — SODIUM CHLORIDE 0.9 % IV SOLN
0.0000 ug/min | INTRAVENOUS | Status: DC
  Administered 2019-05-24 (×2): 70 ug/min via INTRAVENOUS
  Administered 2019-05-24: 02:00:00 20 ug/min via INTRAVENOUS
  Administered 2019-05-24 (×2): 70 ug/min via INTRAVENOUS
  Administered 2019-05-24 (×2): 60 ug/min via INTRAVENOUS
  Filled 2019-05-24: qty 1
  Filled 2019-05-24: qty 10
  Filled 2019-05-24: qty 1
  Filled 2019-05-24: qty 10
  Filled 2019-05-24 (×2): qty 1
  Filled 2019-05-24 (×4): qty 10

## 2019-05-24 MED ORDER — FENTANYL CITRATE (PF) 100 MCG/2ML IJ SOLN
100.0000 ug | Freq: Once | INTRAMUSCULAR | Status: AC
  Administered 2019-05-24: 21:00:00 100 ug via INTRAVENOUS

## 2019-05-24 MED ORDER — ROCURONIUM BROMIDE 50 MG/5ML IV SOLN
50.0000 mg | Freq: Once | INTRAVENOUS | Status: AC
  Administered 2019-05-24: 22:00:00 50 mg via INTRAVENOUS

## 2019-05-24 MED ORDER — SODIUM CHLORIDE 0.9 % IV SOLN
0.0000 ug/min | INTRAVENOUS | Status: DC
  Filled 2019-05-24: qty 1

## 2019-05-24 MED ORDER — NOREPINEPHRINE 16 MG/250ML-% IV SOLN
0.0000 ug/min | INTRAVENOUS | Status: DC
  Administered 2019-05-24: 22:00:00 5 ug/min via INTRAVENOUS
  Administered 2019-05-25: 12:00:00 14 ug/min via INTRAVENOUS
  Administered 2019-05-26: 08:00:00 7 ug/min via INTRAVENOUS
  Filled 2019-05-24 (×3): qty 250

## 2019-05-24 MED ORDER — FENTANYL 2500MCG IN NS 250ML (10MCG/ML) PREMIX INFUSION
50.0000 ug/h | INTRAVENOUS | Status: DC
  Administered 2019-05-24 – 2019-05-27 (×5): 150 ug/h via INTRAVENOUS
  Filled 2019-05-24 (×4): qty 250

## 2019-05-24 MED ORDER — ROCURONIUM BROMIDE 50 MG/5ML IV SOLN
INTRAVENOUS | Status: AC
  Filled 2019-05-24: qty 1

## 2019-05-24 MED ORDER — STERILE WATER FOR INJECTION IV SOLN
INTRAVENOUS | Status: DC
  Filled 2019-05-24 (×3): qty 850

## 2019-05-24 MED ORDER — FENTANYL BOLUS VIA INFUSION
50.0000 ug | INTRAVENOUS | Status: DC | PRN
  Filled 2019-05-24: qty 50

## 2019-05-24 MED ORDER — ETOMIDATE 2 MG/ML IV SOLN
20.0000 mg | Freq: Once | INTRAVENOUS | Status: AC
  Administered 2019-05-24: 20 mg via INTRAVENOUS

## 2019-05-24 MED ORDER — MIDAZOLAM HCL 2 MG/2ML IJ SOLN
2.0000 mg | INTRAMUSCULAR | Status: DC | PRN
  Filled 2019-05-24: qty 2

## 2019-05-24 NOTE — Procedures (Signed)
Central Venous Catheter Insertion Procedure Note Auden Meuser [id number] 1955-11-17  Procedure: Insertion of Central Venous Catheter Indications: Assessment of intravascular volume, Drug and/or fluid administration and Frequent blood sampling  Procedure Details Consent: Risks of procedure as well as the alternatives and risks of each were explained to the (patient/caregiver).  Consent for procedure obtained. Time Out: Verified patient identification, verified procedure, site/side was marked, verified correct patient position, special equipment/implants available, medications/allergies/relevent history reviewed, required imaging and test results available.  Performed  Maximum sterile technique was used including antiseptics, cap, gloves, gown, hand hygiene, mask and sheet. Skin prep: Chlorhexidine; local anesthetic administered A antimicrobial bonded/coated triple lumen catheter was placed in the right femoral vein due to emergent situation using the Seldinger technique.  Evaluation Blood flow good Complications: No apparent complications Patient did tolerate procedure well. Chest X-ray ordered to verify placement.  CXR: Not needed.  Placed in right femoral vein.    Darel Hong, AGACNP-BC [facility] Pager: [phone]  Bradly Bienenstock 05/24/2019, 10:29 PM

## 2019-05-24 NOTE — Progress Notes (Signed)
Pt is extremely anxious with respiratory distress despite being on 100% FiO2 on BiPAP, 2 mg morphine, and Precedex infusion.  Her SpO2 is 82%, with RR in the 40's.  Her ROX index is 1.86, indicating need for intubation.  Spoke with pt and her daughter Fransico Setters (via telephone) of need for intubation due to concern impending respiratory arrest and severe hypoxia despite max support in NIV.  Both pt and daughter are in agreement with proceeding with intubation.  Nursing and respiratory therapy updated on plan.    Darel Hong, AGACNP-BC [facility] Pager: [phone]

## 2019-05-24 NOTE — Progress Notes (Signed)
Have attempted to call pt's daughter 3 times to let her know of successful intubation.  No voice mail box set up.  Will continue to try and reach her for update.    Darel Hong, AGACNP-BC [facility] Pager: [phone]

## 2019-05-24 NOTE — Consult Note (Signed)
Name: Anne [name] MRN: [id number] DOB: May 09, 1955    ADMISSION DATE:  05/23/2019 CONSULTATION DATE:  05/23/2019  REFERRING MD : Dr. Jimmye Norman  CHIEF COMPLAINT:  Shortness of breath  BRIEF PATIENT DESCRIPTION:  64 y.o. Female admitted 2/27 with Acute Hypoxic Respiratory Failure secondary to COVID-19 Pneumonia and severe metabolic acidosis, hypotension, along with severe AKI requiring Bicarb gtt.  SIGNIFICANT EVENTS  2/27- Admission to [facility] 2/27- Transfer to Stepdown due to hypotension, increasing FiO2 requirements, metabolic acidosis requiring Bicarb gtt  05/24/19 -Meeting for goals of care discussion with patient.  Patient understands that she is doing poorly and requiring elevated settings on high flow nasal cannula.  She wishes to proceed with endotracheal intubation and mechanical ventilation if necessary.  She remains full code with fully aggressive measures.   STUDIES:  2/27- CXR>>Multifocal pneumonia   2/27- CT Renal Stone Study>> 1. Left nephrolithiasis, without obstructive uropathy. 2. Bibasilar peripheral ground-glass opacities, consistent with COVID-19 pneumonia. More focal nodular opacity in the left lower lobe could represent an area of consolidation or a true pulmonary nodule. Recommend follow-up chest CT at 3 months, after the patient is recovered from the acute illness. 3. Suspicion of cirrhosis, especially given the history of hepatitis B and C. 4.  Aortic Atherosclerosis  CULTURES: POC SARS-CoV-2 2/27>> POSITIVE Blood culture x2 2/27>> Strep pneumo urinary antigen 2/27>> Legionella urinary antigen 2/27>>  ANTIBIOTICS: N/A  PAST MEDICAL HISTORY :   has a past medical history of Benign essential HTN (11/16/2014), Breathlessness on exertion (09/03/2013), CHF (congestive heart failure) ([place]), COPD (chronic obstructive pulmonary disease) ([place]), Exposure to hepatitis B (12/10/2014), Heart valve disease (09/03/2013), Hepatitis C, and History of  cardiac catheterization (03/24/2014).  has a past surgical history that includes Tubal ligation; Colonoscopy with propofol (N/A, 12/10/2016); and polypectomy (N/A, 12/10/2016). Prior to Admission medications   Medication Sig Start Date End Date Taking? Authorizing Provider  albuterol (PROVENTIL HFA;VENTOLIN HFA) 108 (90 Base) MCG/ACT inhaler Inhale 2 puffs into the lungs every 6 (six) hours as needed for wheezing or shortness of breath.   Yes [provider]  aspirin 81 MG tablet Take 81 mg by mouth daily.   Yes [provider]  carvedilol (COREG) 25 MG tablet Take 25 mg by mouth 2 (two) times daily with a meal.   Yes [provider]  Fluticasone-Salmeterol (ADVAIR DISKUS) 250-50 MCG/DOSE AEPB Inhale 1 puff into the lungs 2 (two) times daily.    Yes [provider]  hydrochlorothiazide (HYDRODIURIL) 25 MG tablet Take 25 mg by mouth daily.   Yes [provider]  Ledipasvir-Sofosbuvir (HARVONI) 90-400 MG TABS Take by mouth.   Yes [provider]  losartan (COZAAR) 25 MG tablet Take 25 mg by mouth.   Yes [provider]  meloxicam (MOBIC) 15 MG tablet Mobic 15 mg tablet  Take 1 tablet every day by oral route.   Yes [provider]  Multiple Vitamin (MULTI-VITAMINS) TABS Take by mouth.   Yes [provider]  omeprazole (PRILOSEC) 20 MG capsule Take 20 mg by mouth daily.    Yes [provider]  spironolactone (ALDACTONE) 25 MG tablet Take 25 mg by mouth daily.    Yes [provider]  tiotropium (SPIRIVA) 18 MCG inhalation capsule Place 18 mcg into inhaler and inhale daily.    Yes [provider]  amLODipine (NORVASC) 5 MG tablet Take by mouth. 11/05/16 11/05/17  [provider]  Fluticasone-Salmeterol (ADVAIR) 250-50 MCG/DOSE AEPB Advair Diskus 250 mcg-50 mcg/dose  powder for inhalation    [provider]   Allergies  Allergen Reactions  . Ibuprofen Other (See Comments)     Reaction:  Unknown   . Nitrofuran Derivatives Other (See Comments)    Reaction:  Unknown   . Ribavirin Nausea And Vomiting    FAMILY HISTORY:  family history includes Arthritis in her mother; Hypertension in her mother. SOCIAL HISTORY:  reports that she has never smoked. She has never used smokeless tobacco. She reports current alcohol use of about 1.0 standard drinks of alcohol per week. She reports that she does not use drugs.   REVIEW OF SYSTEMS:  Positives in BOLD Constitutional: Negative for fever, chills, weight loss, +malaise/fatigue and diaphoresis.  HENT: Negative for hearing loss, ear pain, nosebleeds, congestion, sore throat, neck pain, tinnitus and ear discharge.   Eyes: Negative for blurred vision, double vision, photophobia, pain, discharge and redness.  Respiratory: Negative for +cough, hemoptysis, sputum production, +shortness of breath, wheezing and stridor.   Cardiovascular: Negative for chest pain, palpitations, orthopnea, claudication, leg swelling and PND.  Gastrointestinal: Negative for heartburn, +nausea, vomiting, abdominal pain, +diarrhea, constipation, blood in stool and melena.  Genitourinary: Negative for dysuria, urgency, frequency, hematuria and flank pain.  Musculoskeletal: Negative for myalgias, +back pain, joint pain and falls.  Skin: Negative for itching and rash.  Neurological: Negative for dizziness, tingling, tremors, sensory change, speech change, focal weakness, seizures, loss of consciousness, weakness and headaches.  Endo/Heme/Allergies: Negative for environmental allergies and polydipsia. Does not bruise/bleed easily.    VITAL SIGNS: Temp:  [98.1 F (36.7 C)-99.7 F (37.6 C)] 99.1 F (37.3 C) (02/28 0800) Pulse Rate:  [75-98] 75 (02/28 1000) Resp:  [15-40] 22 (02/28 1000) BP: (70-117)/(27-74) 111/62 (02/28 1000) SpO2:  [82 %-99 %] 88 % (02/28 1000) Weight:  [83.9 kg-100.9 kg] 100.6 kg (02/28 0541)  PHYSICAL EXAMINATION: General:   Acutely ill appearing female, laying in bed, on 10 L HFNC, in NAD Neuro:  Awake, A&O x4, follows commands, no focal deficits, speech clear HEENT:  Atraumatic, normocephalic, neck supple, no JVD Cardiovascular:  Regular rate and rhythm, 2+ pulses throughout Lungs: Decreased breath sounds bilateral with mild rhonchorous breath sounds, mild tachypnea, even, no assessory muscle use Abdomen:  Obese, soft, nontender,nondistended, no guarding or rebound tenderness Musculoskeletal:  Normal bulk and tone, no deformities, no edema Skin:  Warm and dry.  No obvious rashes, lesions, or ulcerations  Recent Labs  Lab 05/23/19 2005 05/24/19 0015 05/24/19 0536  NA 135 141 141  K 4.6 3.9 3.8  CL 115* 113* 110  CO2 11* 18* 22  BUN 41* 37* 32*  CREATININE 4.06* 3.29* 2.54*  GLUCOSE 155* 144* 119*   Recent Labs  Lab 05/23/19 1436 05/24/19 0536  HGB 11.5* 10.7*  HCT 34.9* 31.1*  WBC 9.0 7.8  PLT 221 232   DG Chest Port 1 View  Result Date: 05/23/2019 CLINICAL DATA:  64 year old female with COVID infection, shortness of breath EXAM: PORTABLE CHEST 1 VIEW COMPARISON:  07/19/2014 FINDINGS: Cardiomediastinal silhouette unchanged in size and contour. No pneumothorax. No pleural effusion. New reticulonodular opacities of the bilateral lungs, towards the periphery and with a lower lung predominance. No displaced fracture IMPRESSION: Multifocal pneumonia compatible with the given history. Electronically Signed   By: Corrie Mckusick D.O.   On: 05/23/2019 15:27   CT Renal Stone Study  Result Date: 05/23/2019 CLINICAL DATA:  Left flank pain. Hypotension. COVID positive. Shortness of breath. History of hepatitis B and C. EXAM: CT ABDOMEN AND  PELVIS WITHOUT CONTRAST TECHNIQUE: Multidetector CT imaging of the abdomen and pelvis was performed following the standard protocol without IV contrast. COMPARISON:  Today's chest radiograph, dictated separately. 04/26/2015 renal ultrasound. FINDINGS: Lower chest: Peripheral  predominant ground-glass opacities at both lung bases, consistent with COVID-19 pneumonia. 1.8 cm left lower lobe pulmonary nodule or or area of more confluent consolidation, including at 1.8 cm on 13/4. Mild cardiomegaly, without pericardial or pleural effusion. Small right cardiophrenic angle nodes are likely reactive. Hepatobiliary: Hepatic morphology, including caudate lobe enlargement and medial segment left liver lobe atrophy. Subtle irregular hepatic capsule. Normal gallbladder, without biliary ductal dilatation. Pancreas: Normal, without mass or ductal dilatation. Spleen: Normal in size, without focal abnormality. Adrenals/Urinary Tract: Normal adrenal glands. Mild renal cortical thinning bilaterally. Left renal punctate collecting system calculi and renal vascular calcifications. No hydroureter or ureteric calculi. No bladder calculi. Stomach/Bowel: Normal stomach, without wall thickening. Normal colon, appendix, and terminal ileum. Of note, the appendix terminates in the left upper pelvis. Normal small bowel. Vascular/Lymphatic: Aortic and branch vessel atherosclerosis. No abdominopelvic adenopathy. Reproductive: Normal uterus and adnexa. Other: No significant free fluid.  Mild pelvic floor laxity. Musculoskeletal: Probable bone island in the right sacrum. Lumbosacral spondylosis with multilevel disc bulges. IMPRESSION: 1. Left nephrolithiasis, without obstructive uropathy. 2. Bibasilar peripheral ground-glass opacities, consistent with COVID-19 pneumonia. More focal nodular opacity in the left lower lobe could represent an area of consolidation or a true pulmonary nodule. Recommend follow-up chest CT at 3 months, after the patient is recovered from the acute illness. 3. Suspicion of cirrhosis, especially given the history of hepatitis B and C. 4.  Aortic Atherosclerosis (ICD10-I70.0). Electronically Signed   By: Abigail Miyamoto M.D.   On: 05/23/2019 15:58    ASSESSMENT / PLAN:  Acute Hypoxic Respiratory  Failure secondary to COVID-19 Pneumonia & severe metabolic acidosis LLL Pulmonary Nodule noted on CT  Hx: COPD -Supplemental O2 as needed to maintain O2 sats >88% -Follow intermittent CXR & ABG as needed -High risk for intubation -Bronchodilators -IV Decadron -Remdesivir per pharmacy consutl -Incentive spirometry -Encourage prone positioning as tolerated -Bicarb gtt -Follow up CT Chest in 3 months to reevaluate LLL pulmonary nodule  COVID-19 Pneumonia -Monitor fever curve -Trend WBC's & Procalcitonin -Follow cultures as above -Remdesevir -Continue Decadron -Procalcitonin is minimally elevated (in setting of severe AKI), thus will hold off on empiric antibiotics for now -Trend inflammatory markers -D-dimer is elevated, consider V/Q scan is D-dimer trends upwards (unable to obtain CTA Chest due to AKI) -Urinalysis is negative for UTI -Stool negative for C-diff  AKI on CKD Severe Metabolic Acidosis  Hypomagnesemia -Monitor I&O's / urinary output -Follow BMP -Ensure adequate renal perfusion -Avoid nephrotoxic agents as able -Replace electrolytes as indicated -IV Fluids -CT Renal stone with left kidney stone, but no evidence of obstructive uropathy or hydronephrosis -Received 3 amps of Bicarb prior to transfer to Stepdown -Continue Bicarb gtt -If Creatinine continues to trend up will need to consult Nephrology  Hypotension: hypovolemic shock +/- septic shock Mildly elevated Troponin, likely demand ischemia Hx: HTN, CHF, HLD -Continuous cardiac monitoring -Maintain MAP >65 -Received IV Fluid resuscitation (3L) -Vasopressors as needed to maintain MAP goal -Hold home antihypertensives -Trend troponin until downtrending -Obtain 2D Echocardiogram  Hyperglycemia -CBG's -SSI as needed -Follow ICU Hypo/hyperglycemia protocol            DISPOSITION: Stepdown GOALS OF CARE: Full code VTE PROPHYLAXIS: SQ Heparin UPDATES: Updated pt at bedside 05/24/19  Critical  care provider statement:    Critical  care time (minutes):  33   Critical care time was exclusive of:  Separately billable procedures and  treating other patients   Critical care was necessary to treat or prevent imminent or  life-threatening deterioration of the following conditions:  acute hypoxemic respiratory failure, COVID-19 pneumonia, severe AKI, multiple comorbid conditions.   Critical care was time spent personally by me on the following  activities:  Development of treatment plan with patient or surrogate,  discussions with consultants, evaluation of patient's response to  treatment, examination of patient, obtaining history from patient or  surrogate, ordering and performing treatments and interventions, ordering  and review of laboratory studies and re-evaluation of patient's condition   I assumed direction of critical care for this patient from another  provider in my specialty: no     Ottie Glazier, M.D.  [facility] [place]    05/24/2019, 12:05 PM

## 2019-05-24 NOTE — Progress Notes (Addendum)
PROGRESS NOTE    Anne [name]  [id number] DOB: 1955/10/26 DOA: 05/23/2019 PCP: Freddy Finner, NP      Assessment & Plan:   Active Problems:   Pneumonia due to COVID-19 virus   Acute respiratory failure due to COVID-19 [facility])  COVID19 pneumonia: will continue zinc, vitamin c & continue on steroids. Remdesivir started today as Cr/GFR improved. Continue on airborne & contact precautions. Inflammatory markers are elevated. D-dimer is elevated but will hold off on CTA chest at this time secondary to significant AKI, but V/Q scan ordered. Continue on supplemental oxygen and wean as tolerated. Encourage incentive spirometry. Will continue bronchodilators. Encourage prone positioning. CT shows possible left lower lobe pulmon nodule so radiology recommends f/u w/ CT chest in 3 months after the pt has recovered from acute illness. Pt verbalized her understanding.   Acute hypoxic respiratory failure: secondary to COVID19. Increased oxygen demand, currently on HFNC. ICU consulted for worsening respiratory failure. High risk for intubation. Management as stated above  Likely AKI on CKD: baseline Cr/GFR is unknown. Cr is trending down. Pt did have HD x 1 after taking some medication in the past as per pt. Possibly secondary to [place]. Continue on IVFs. Avoid nephrotoxic meds.  Metabolic acidosis: resolved w/ bicarb drip  COPD: continue on home bronchodilators or hospital substitutes   HTN: will continue to hold home dose of losartan, spironolactone,carvedilol, HCTZ amlodipine secondary to hypotension.   Hyperglycemia: no hx of DM. Will continue to monitor  GERD: continue on PPI   Hx of HCV: completed course of harvoni. CT abd/pelvis shows suspicion of cirrhosis. Outpatient GI f/u is recommended. Pt verbalized her understanding     DVT prophylaxis: heparin Code Status:  full Family Communication: discussed pt's care w/ pt's son, [name] [phone]) and answered his  questions Disposition Plan: likely will need several more days as pt clinically improves    Consultants:   ICU   Procedures:    Antimicrobials:    Subjective: Pt c/o shortness of breath   Objective: Vitals:   05/24/19 0541 05/24/19 0545 05/24/19 0600 05/24/19 0615  BP:  (!) 99/52 116/74 114/72  Pulse:  85 81 78  Resp:  19 (!) 33 (!) 38  Temp:      TempSrc:      SpO2:  90% 90% 90%  Weight: 100.6 kg     Height: 5\' 3"  (1.6 m)       Intake/Output Summary (Last 24 hours) at 05/24/2019 0833 Last data filed at 05/24/2019 0513 Gross per 24 hour  Intake 4615.21 ml  Output 1589 ml  Net 3026.21 ml   Filed Weights   05/23/19 1434 05/23/19 2330 05/24/19 0541  Weight: 83.9 kg 100.9 kg 100.6 kg    Examination:  General exam: Appears calm and comfortable  Respiratory system: diminished breath sounds b/l. No rales Cardiovascular system: S1 & S2+. No rubs, gallops or clicks. No pedal edema. Gastrointestinal system: Abdomen is nondistended, soft and nontender.  Normal bowel sounds heard. Central nervous system: Alert and oriented. Moves all 4 extremities Psychiatry: Judgement and insight appear normal. Mood & affect appropriate.     Data Reviewed: I have personally reviewed following labs and imaging studies  CBC: Recent Labs  Lab 05/23/19 1436 05/24/19 0536  WBC 9.0 7.8  NEUTROABS 6.9  --   HGB 11.5* 10.7*  HCT 34.9* 31.1*  MCV 87.0 84.5  PLT 221 [id number]   Basic Metabolic Panel: Recent Labs  Lab 05/23/19 1436 05/23/19 2005 05/24/19 0015 05/24/19 0536  NA 133* 135 141 141  K 4.4 4.6 3.9 3.8  CL 107 115* 113* 110  CO2 12* 11* 18* 22  GLUCOSE 117* 155* 144* 119*  BUN 45* 41* 37* 32*  CREATININE 5.34* 4.06* 3.29* 2.54*  CALCIUM 8.8* 7.7* 7.7* 7.8*  MG  --  1.6*  --   --    GFR: Estimated Creatinine Clearance: 26 mL/min (A) (by C-G formula based on SCr of 2.54 mg/dL (H)). Liver Function Tests: Recent Labs  Lab 05/23/19 1436 05/24/19 0536  AST 37 62*   ALT 25 42  ALKPHOS 63 57  BILITOT 0.6 0.8  PROT 8.4* 6.9  ALBUMIN 3.9 2.7*   No results for input(s): LIPASE, AMYLASE in the last 168 hours. No results for input(s): AMMONIA in the last 168 hours. Coagulation Profile: No results for input(s): INR, PROTIME in the last 168 hours. Cardiac Enzymes: No results for input(s): CKTOTAL, CKMB, CKMBINDEX, TROPONINI in the last 168 hours. BNP (last 3 results) No results for input(s): PROBNP in the last 8760 hours. HbA1C: No results for input(s): HGBA1C in the last 72 hours. CBG: Recent Labs  Lab 05/23/19 2323  GLUCAP 144*   Lipid Profile: Recent Labs    05/23/19 1436  TRIG 164*   Thyroid Function Tests: No results for input(s): TSH, T4TOTAL, FREET4, T3FREE, THYROIDAB in the last 72 hours. Anemia Panel: Recent Labs    05/23/19 1436  FERRITIN 817*   Sepsis Labs: Recent Labs  Lab 05/23/19 1436 05/24/19 0536  PROCALCITON 0.14 <0.10  LATICACIDVEN 1.2  --     Recent Results (from the past 240 hour(s))  Blood Culture (routine x 2)     Status: None (Preliminary result)   Collection Time: 05/23/19  2:36 PM   Specimen: BLOOD  Result Value Ref Range Status   Specimen Description BLOOD RIGHT ANTECUBITAL  Final   Special Requests   Final    BOTTLES DRAWN AEROBIC AND ANAEROBIC Blood Culture results may not be optimal due to an excessive volume of blood received in culture bottles   Culture   Final    NO GROWTH < 24 HOURS Performed at [facility], [address], [place], [place] [address]    Report Status PENDING  Incomplete  Blood Culture (routine x 2)     Status: None (Preliminary result)   Collection Time: 05/23/19  2:36 PM   Specimen: BLOOD  Result Value Ref Range Status   Specimen Description BLOOD BLOOD LEFT HAND  Final   Special Requests   Final    BOTTLES DRAWN AEROBIC ONLY Blood Culture results may not be optimal due to an inadequate volume of blood received in culture bottles   Culture   Final    NO  GROWTH < 24 HOURS Performed at [facility], [address] [place], [place] [address]    Report Status PENDING  Incomplete  C difficile quick scan w PCR reflex     Status: None   Collection Time: 05/23/19  8:20 PM   Specimen: STOOL  Result Value Ref Range Status   C Diff antigen NEGATIVE NEGATIVE Final   C Diff toxin NEGATIVE NEGATIVE Final   C Diff interpretation No C. difficile detected.  Final    Comment: Performed at [facility], [address] [place], [place] [address]  MRSA PCR Screening     Status: Abnormal   Collection Time: 05/23/19 11:14 PM   Specimen: Nasopharyngeal  Result Value Ref Range Status   MRSA by  PCR POSITIVE (A) NEGATIVE Final    Comment:        The GeneXpert MRSA Assay (FDA approved for NASAL specimens only), is one component of a comprehensive MRSA colonization surveillance program. It is not intended to diagnose MRSA infection nor to guide or monitor treatment for MRSA infections. RESULT CALLED TO, READ BACK BY AND VERIFIED WITH: MIKE BILLS 05/24/19 AT 0035 HS Performed at [facility], [address] [place], [place] [address]          Radiology Studies: DG Chest [place] 1 View  Result Date: 05/23/2019 CLINICAL DATA:  64 year old female with COVID infection, shortness of breath EXAM: PORTABLE CHEST 1 VIEW COMPARISON:  07/19/2014 FINDINGS: Cardiomediastinal silhouette unchanged in size and contour. No pneumothorax. No pleural effusion. New reticulonodular opacities of the bilateral lungs, towards the periphery and with a lower lung predominance. No displaced fracture IMPRESSION: Multifocal pneumonia compatible with the given history. Electronically Signed   By: Corrie Mckusick D.O.   On: 05/23/2019 15:27   CT Renal Stone Study  Result Date: 05/23/2019 CLINICAL DATA:  Left flank pain. Hypotension. COVID positive. Shortness of breath. History of hepatitis B and C. EXAM: CT ABDOMEN AND PELVIS WITHOUT CONTRAST TECHNIQUE:  Multidetector CT imaging of the abdomen and pelvis was performed following the standard protocol without IV contrast. COMPARISON:  Today's chest radiograph, dictated separately. 04/26/2015 renal ultrasound. FINDINGS: Lower chest: Peripheral predominant ground-glass opacities at both lung bases, consistent with COVID-19 pneumonia. 1.8 cm left lower lobe pulmonary nodule or or area of more confluent consolidation, including at 1.8 cm on 13/4. Mild cardiomegaly, without pericardial or pleural effusion. Small right cardiophrenic angle nodes are likely reactive. Hepatobiliary: Hepatic morphology, including caudate lobe enlargement and medial segment left liver lobe atrophy. Subtle irregular hepatic capsule. Normal gallbladder, without biliary ductal dilatation. Pancreas: Normal, without mass or ductal dilatation. Spleen: Normal in size, without focal abnormality. Adrenals/Urinary Tract: Normal adrenal glands. Mild renal cortical thinning bilaterally. Left renal punctate collecting system calculi and renal vascular calcifications. No hydroureter or ureteric calculi. No bladder calculi. Stomach/Bowel: Normal stomach, without wall thickening. Normal colon, appendix, and terminal ileum. Of note, the appendix terminates in the left upper pelvis. Normal small bowel. Vascular/Lymphatic: Aortic and branch vessel atherosclerosis. No abdominopelvic adenopathy. Reproductive: Normal uterus and adnexa. Other: No significant free fluid.  Mild pelvic floor laxity. Musculoskeletal: Probable bone island in the right sacrum. Lumbosacral spondylosis with multilevel disc bulges. IMPRESSION: 1. Left nephrolithiasis, without obstructive uropathy. 2. Bibasilar peripheral ground-glass opacities, consistent with COVID-19 pneumonia. More focal nodular opacity in the left lower lobe could represent an area of consolidation or a true pulmonary nodule. Recommend follow-up chest CT at 3 months, after the patient is recovered from the acute illness.  3. Suspicion of cirrhosis, especially given the history of hepatitis B and C. 4.  Aortic Atherosclerosis (ICD10-I70.0). Electronically Signed   By: Abigail Miyamoto M.D.   On: 05/23/2019 15:58        Scheduled Meds: . vitamin C  500 mg Oral Daily  . Chlorhexidine Gluconate Cloth  6 each Topical Daily  . dexamethasone (DECADRON) injection  6 mg Intravenous Q24H  . heparin injection (subcutaneous)  5,000 Units Subcutaneous Q8H  . mometasone-formoterol  2 puff Inhalation BID  . sodium chloride flush  3 mL Intravenous Q12H  . zinc sulfate  220 mg Oral Daily   Continuous Infusions: . phenylephrine (NEO-SYNEPHRINE) Adult infusion 60 mcg/min (05/24/19 0513)  .  sodium bicarbonate (isotonic) infusion in sterile water  LOS: 1 day    Time spent: 33 mins    Wyvonnia Dusky, MD [facility] Pager [phone]  If 7PM-7AM, please contact night-coverage [email or web address] 05/24/2019, 8:33 AM  '

## 2019-05-24 NOTE — Progress Notes (Signed)
Remdesivir - Pharmacy Brief Note   O:  ALT: 42 CXR: multifocal pneumonia SpO2: 80-90s% on 15L HFNC   A/P:  Remdesivir 200 mg IVPB once followed by 100 mg IVPB daily x 4 days.   Dorena Bodo, PharmD 05/24/2019 12:36 PM

## 2019-05-24 NOTE — Procedures (Signed)
Endotracheal Intubation: Intubation Procedure Note Anne [name] [id number] 1955-08-18  Procedure: Intubation Indications: Respiratory insufficiency  Procedure Details Consent: Risks of procedure as well as the alternatives and risks of each were explained to the (patient/caregiver).  Consent for procedure obtained. Time Out: Verified patient identification, verified procedure, site/side was marked, verified correct patient position, special equipment/implants available, medications/allergies/relevent history reviewed, required imaging and test results available.  Performed  Maximum sterile technique was used including gloves, gown, hand hygiene and mask.  4    Consent: Given by pt and her daughter   Hand washing performed prior to starting the procedure.    Medications administered for sedation prior to procedure:  Fentanyl 100 mcg IV, 20 mg Etomidate IV, Rocuronium 50 mg IV     A time out procedure was called and correct patient, name, & ID confirmed. Needed supplies and equipment were assembled and checked to include ETT, 10 ml syringe, Glidescope, Mac and Miller blades, suction, oxygen and bag mask valve, end tidal CO2 monitor.    Patient was positioned to align the mouth and pharynx to facilitate visualization of the glottis.    Heart rate, SpO2 and blood pressure was continuously monitored during the procedure. Pre-oxygenation was conducted prior to intubation and endotracheal tube was placed through the vocal cords into the trachea.       The artificial airway was placed under direct visualization via glidescope route using a 7.5 ETT on the first attempt.   ETT was secured at 21 cm. (ETT was initially secured at 25 cm, but follow up CXR showed tip in the right mainstem bronchus.  Tube was withdrawn by 3 cm to 22 cm.  Follow up CXR showed tip just above the carina.  Tube was withdrawn another cm to 21 cm at the lip, with CXR confirming satisfactory positioning.   Placement  was confirmed by auscuitation of lungs with good breath sounds bilaterally and no stomach sounds.  Condensation was noted on endotracheal tube.   Pulse ox 90 %.  CO2 detector in place with appropriate color change.    Complications: None .    Evaluation Hemodynamic Status: BP stable throughout; O2 sats: initially in the 90's, then dropped to the 40's-60's. Pt was bagged and O2 improved with withdraw of ETT with CXR confirming good placement. Patient's Current Condition: stable Complications: No apparent complications Patient did tolerate procedure well. Chest X-ray ordered to verify placement.  CXR: tube position low-repostitioned.       Darel Hong, AGACNP-BC [facility] Pager: [phone]  Bradly Bienenstock 05/24/2019

## 2019-05-25 ENCOUNTER — Inpatient Hospital Stay
Admit: 2019-05-25 | Discharge: 2019-05-25 | Disposition: A | Discharge status: Home / Self Care | Payer: Medicaid Other | Attending: Pulmonary Disease | Admitting: Pulmonary Disease

## 2019-05-25 ENCOUNTER — Inpatient Hospital Stay: Payer: Medicaid Other

## 2019-05-25 DIAGNOSIS — U071 COVID-19: Principal | ICD-10-CM

## 2019-05-25 DIAGNOSIS — J1282 Pneumonia due to coronavirus disease 2019: Secondary | ICD-10-CM

## 2019-05-25 DIAGNOSIS — J9600 Acute respiratory failure, unspecified whether with hypoxia or hypercapnia: Secondary | ICD-10-CM

## 2019-05-25 LAB — BLOOD GAS, ARTERIAL
Acid-base deficit: 2.1 mmol/L — ABNORMAL HIGH (ref 0.0–2.0)
Acid-base deficit: 5.3 mmol/L — ABNORMAL HIGH (ref 0.0–2.0)
Bicarbonate: 24.7 mmol/L (ref 20.0–28.0)
Bicarbonate: 22 mmol/L (ref 20.0–28.0)
FIO2: 0.9
FIO2: 0.9
MECHVT: 400 mL
MECHVT: 400 mL
O2 Saturation: 89 %
O2 Saturation: 88.8 %
PEEP: 15 cmH2O
PEEP: 15 cmH2O
Patient temperature: 37
Patient temperature: 37
RATE: 28 resp/min
RATE: 32 resp/min
pCO2 arterial: 49 mmHg — ABNORMAL HIGH (ref 32.0–48.0)
pCO2 arterial: 49 mmHg — ABNORMAL HIGH (ref 32.0–48.0)
pH, Arterial: 7.31 — ABNORMAL LOW (ref 7.350–7.450)
pH, Arterial: 7.26 — ABNORMAL LOW (ref 7.350–7.450)
pO2, Arterial: 62 mmHg — ABNORMAL LOW (ref 83.0–108.0)
pO2, Arterial: 65 mmHg — ABNORMAL LOW (ref 83.0–108.0)

## 2019-05-25 LAB — FIBRIN DERIVATIVES D-DIMER (ARMC ONLY): Fibrin derivatives D-dimer (ARMC): 2207.7 ng/mL (FEU) — ABNORMAL HIGH (ref 0.00–499.00)

## 2019-05-25 LAB — BASIC METABOLIC PANEL WITH GFR
Anion gap: 11 (ref 5–15)
BUN: 25 mg/dL — ABNORMAL HIGH (ref 8–23)
CO2: 27 mmol/L (ref 22–32)
Calcium: 7.5 mg/dL — ABNORMAL LOW (ref 8.9–10.3)
Chloride: 106 mmol/L (ref 98–111)
Creatinine, Ser: 1.9 mg/dL — ABNORMAL HIGH (ref 0.44–1.00)
GFR calc Af Amer: 32 mL/min — ABNORMAL LOW (ref >60)
GFR calc non Af Amer: 28 mL/min — ABNORMAL LOW (ref >60)
Glucose, Bld: 229 mg/dL — ABNORMAL HIGH (ref 70–99)
Potassium: 4.5 mmol/L (ref 3.5–5.1)
Sodium: 144 mmol/L (ref 135–145)

## 2019-05-25 LAB — LEGIONELLA PNEUMOPHILA SEROGP 1 UR AG: L. pneumophila Serogp 1 Ur Ag: NEGATIVE

## 2019-05-25 LAB — GLUCOSE, CAPILLARY
Glucose-Capillary: 189 mg/dL — ABNORMAL HIGH (ref 70–99)
Glucose-Capillary: 177 mg/dL — ABNORMAL HIGH (ref 70–99)
Glucose-Capillary: 182 mg/dL — ABNORMAL HIGH (ref 70–99)

## 2019-05-25 LAB — PROTIME-INR
INR: 1.1 (ref 0.8–1.2)
Prothrombin Time: 13.9 seconds (ref 11.4–15.2)

## 2019-05-25 LAB — CBC
HCT: 33.8 % — ABNORMAL LOW (ref 36.0–46.0)
Hemoglobin: 11.4 g/dL — ABNORMAL LOW (ref 12.0–15.0)
MCH: 29.7 pg (ref 26.0–34.0)
MCHC: 33.7 g/dL (ref 30.0–36.0)
MCV: 88 fL (ref 80.0–100.0)
Platelets: 371 10*3/uL (ref 150–400)
RBC: 3.84 MIL/uL — ABNORMAL LOW (ref 3.87–5.11)
RDW: 13.3 % (ref 11.5–15.5)
WBC: 27.1 10*3/uL — ABNORMAL HIGH (ref 4.0–10.5)
nRBC: 0 % (ref 0.0–0.2)

## 2019-05-25 LAB — BRAIN NATRIURETIC PEPTIDE: B Natriuretic Peptide: 206 pg/mL — ABNORMAL HIGH (ref 0.0–100.0)

## 2019-05-25 LAB — PROCALCITONIN: Procalcitonin: 0.68 ng/mL

## 2019-05-25 LAB — APTT: aPTT: 39 seconds — ABNORMAL HIGH (ref 24–36)

## 2019-05-25 LAB — FERRITIN: Ferritin: 1151 ng/mL — ABNORMAL HIGH (ref 11–307)

## 2019-05-25 LAB — HEPARIN LEVEL (UNFRACTIONATED): Heparin Unfractionated: 1.2 IU/mL — ABNORMAL HIGH (ref 0.30–0.70)

## 2019-05-25 LAB — C-REACTIVE PROTEIN: CRP: 9.4 mg/dL — ABNORMAL HIGH (ref <1.0)

## 2019-05-25 MED ORDER — CHLORHEXIDINE GLUCONATE CLOTH 2 % EX PADS
6.0000 | MEDICATED_PAD | Freq: Every day | CUTANEOUS | Status: DC
  Administered 2019-05-25: 6 via TOPICAL

## 2019-05-25 MED ORDER — SODIUM CHLORIDE 0.9% FLUSH: 10.0000 mL | INTRAVENOUS | Status: DC | PRN

## 2019-05-25 MED ORDER — HEPARIN (PORCINE) 25000 UT/250ML-% IV SOLN
900.0000 [IU]/h | INTRAVENOUS | Status: DC
  Administered 2019-05-25: 1200 [IU]/h via INTRAVENOUS
  Filled 2019-05-25: qty 250

## 2019-05-25 MED ORDER — VECURONIUM BROMIDE 10 MG IV SOLR
10.0000 mg | INTRAVENOUS | Status: DC | PRN
  Administered 2019-05-25: 10 mg via INTRAVENOUS
  Filled 2019-05-25: qty 10

## 2019-05-25 MED ORDER — CHLORHEXIDINE GLUCONATE 0.12% ORAL RINSE (MEDLINE KIT)
15.0000 mL | Freq: Two times a day (BID) | OROMUCOSAL | Status: DC
  Administered 2019-05-25 – 2019-05-27 (×4): 15 mL via OROMUCOSAL

## 2019-05-25 MED ORDER — BUDESONIDE 0.25 MG/2ML IN SUSP
0.2500 mg | Freq: Two times a day (BID) | RESPIRATORY_TRACT | Status: DC
  Administered 2019-05-25 – 2019-05-27 (×5): 0.25 mg via RESPIRATORY_TRACT
  Filled 2019-05-25 (×5): qty 2

## 2019-05-25 MED ORDER — IPRATROPIUM-ALBUTEROL 0.5-2.5 (3) MG/3ML IN SOLN
3.0000 mL | Freq: Four times a day (QID) | RESPIRATORY_TRACT | Status: DC
  Administered 2019-05-25 – 2019-05-27 (×8): 3 mL via RESPIRATORY_TRACT
  Filled 2019-05-25 (×8): qty 3

## 2019-05-25 MED ORDER — MUPIROCIN 2 % EX OINT
1.0000 "application " | TOPICAL_OINTMENT | Freq: Two times a day (BID) | CUTANEOUS | Status: DC
  Administered 2019-05-25 – 2019-05-27 (×5): 1 via NASAL
  Filled 2019-05-25: qty 22

## 2019-05-25 MED ORDER — SODIUM CHLORIDE 0.9% FLUSH
10.0000 mL | Freq: Two times a day (BID) | INTRAVENOUS | Status: DC
  Administered 2019-05-25 – 2019-05-26 (×3): 10 mL

## 2019-05-25 MED ORDER — ORAL CARE MOUTH RINSE
15.0000 mL | OROMUCOSAL | Status: DC
  Administered 2019-05-25 – 2019-05-27 (×17): 15 mL via OROMUCOSAL

## 2019-05-25 MED ORDER — IVERMECTIN 3 MG PO TABS
200.0000 ug/kg | ORAL_TABLET | ORAL | Status: AC
  Administered 2019-05-25 – 2019-05-27 (×2): 19500 ug via ORAL
  Filled 2019-05-25 (×2): qty 7

## 2019-05-25 MED ORDER — INSULIN ASPART 100 UNIT/ML ~~LOC~~ SOLN
1.0000 [IU] | SUBCUTANEOUS | Status: DC
  Administered 2019-05-25 – 2019-05-26 (×3): 2 [IU] via SUBCUTANEOUS
  Administered 2019-05-26 (×2): 1 [IU] via SUBCUTANEOUS
  Administered 2019-05-26: 09:00:00 2 [IU] via SUBCUTANEOUS
  Administered 2019-05-26: 1 [IU] via SUBCUTANEOUS
  Administered 2019-05-26: 2 [IU] via SUBCUTANEOUS
  Administered 2019-05-27 (×2): 1 [IU] via SUBCUTANEOUS
  Filled 2019-05-25 (×11): qty 1

## 2019-05-25 MED ORDER — MELATONIN 5 MG PO TABS: 10.0000 mg | ORAL_TABLET | Freq: Every day | ORAL | Status: DC

## 2019-05-25 NOTE — Progress Notes (Addendum)
Name: Anne [name] MRN: [id number] DOB: April 12, 1955    ADMISSION DATE:  05/23/2019 ORIGINAL CONSULTATION DATE:  05/23/2019  REFERRING MD :  Sharion Settler, NP  FOLLOW-UP: Respiratory failure, hypoxic, due to COVID-19, ventilator dependence.  BRIEF PATIENT DESCRIPTION:  64 y.o. Female admitted 2/27 with Acute Hypoxic Respiratory Failure secondary to COVID-19 Pneumonia and severe metabolic acidosis, hypotension, along with severe AKI requiring Bicarb gtt.  SIGNIFICANT EVENTS  2/27- Admission to [facility] unit 2/27- Transfer to Stepdown due to hypotension, increasing FiO2 requirements, metabolic acidosis requiring Bicarb gtt 2/28- intubated, initiated mechanical ventilation. Central venous access right femoral placed 3/01- continued issues with oxygenation despite mechanical ventilation, placed in prone position  STUDIES:  2/27- CXR>>Multifocal pneumonia   2/27- CT Renal Stone Study>> 1. Left nephrolithiasis, without obstructive uropathy. 2. Bibasilar peripheral ground-glass opacities, consistent with COVID-19 pneumonia. More focal nodular opacity in the left lower lobe could represent an area of consolidation or a true pulmonary nodule. Recommend follow-up chest CT at 3 months, after the patient is recovered from the acute illness. 3. Suspicion of cirrhosis, especially given the history of hepatitis B and C. 4.  Aortic Atherosclerosis  CULTURES: POC SARS-CoV-2 2/27>> POSITIVE Blood culture x2 2/27>> Strep pneumo urinary antigen 2/27>> negative Legionella urinary antigen 2/27>> negative  ANTIBIOTICS: N/A   Allergies  Allergen Reactions  . Ibuprofen Other (See Comments)    Reaction:  Unknown   . Nitrofuran Derivatives Other (See Comments)    Reaction:  Unknown   . Ribavirin Nausea And Vomiting    REVIEW OF SYSTEMS: Unable to perform due to critical illness, mechanically ventilated status.  SUBJECTIVE:  Intubated, mechanically ventilated. Heavily sedated. Does  not follow commands. RASS -4.  VITAL SIGNS: Temp:  [96.1 F (35.6 C)-100.4 F (38 C)] 96.1 F (35.6 C) (03/01 2000) Pulse Rate:  [39-84] 42 (03/01 2000) Resp:  [15-32] 32 (03/01 2000) BP: (85-123)/(41-74) 96/60 (03/01 2000) SpO2:  [81 %-100 %] 95 % (03/01 2002) FiO2 (%):  [90 %-100 %] 90 % (03/01 2002)  VENT SETTINGS: Vent Mode: PRVC FiO2 (%):  [90 %] 90 % Set Rate:  [32 bmp-34 bmp] 32 bmp Vt Set:  [400 mL] 400 mL PEEP:  [15 cmH20-16 cmH20] 15 cmH20 Plateau Pressure:  [27 [id number] cmH20] 34 cmH20  PHYSICAL EXAMINATION: General: Critically ill appearing female, obese, intubated, mechanically ventilated Neuro: Heavily sedated, RASS -4 to prevent ventilator asynchrony, not following commands HEENT:  Atraumatic, normocephalic, orotracheally intubated, OG in place Neck: Supple, trachea midline, no crepitus Cardiovascular: Monitor: Sinus bradycardia. Pulses intact distally +2 Lungs:  Unable to auscultate well due to CAPR, no ventilator asynchrony Abdomen:  Obese, soft,nondistended Musculoskeletal:  Normal bulk and tone, no deformities, no edema Skin:  Warm and dry.  No obvious rashes, lesions, or ulcerations  Recent Labs  Lab 05/24/19 1222 05/24/19 2204 05/25/19 0432  NA 142 144 144  K 4.1 4.0 4.5  CL 109 108 106  CO2 22 26 27   BUN 28* 22 25*  CREATININE 2.00* 1.79* 1.90*  GLUCOSE 117* 152* 229*   Recent Labs  Lab 05/24/19 0536 05/24/19 2204 05/25/19 0432  HGB 10.7* 11.0* 11.4*  HCT 31.1* 31.9* 33.8*  WBC 7.8 17.6* 27.1*  PLT 232 326 371   DG Abd 1 View  Result Date: 05/25/2019 CLINICAL DATA:  Check gastric catheter placement EXAM: ABDOMEN - 1 VIEW COMPARISON:  None. FINDINGS: Gastric catheter is noted within the stomach. No abnormal mass or abnormal calcifications are seen. No free air is noted.  IMPRESSION: Gastric catheter within the stomach. Electronically Signed   By: Inez Catalina M.D.   On: 05/25/2019 03:19   US Venous Img Lower Bilateral (DVT)  Result Date:  05/24/2019 CLINICAL DATA:  Elevated D-dimer EXAM: BILATERAL LOWER EXTREMITY VENOUS DOPPLER ULTRASOUND TECHNIQUE: Gray-scale sonography with graded compression, as well as color Doppler and duplex ultrasound were performed to evaluate the lower extremity deep venous systems from the level of the common femoral vein and including the common femoral, femoral, profunda femoral, popliteal and calf veins including the posterior tibial, peroneal and gastrocnemius veins when visible. The superficial great saphenous vein was also interrogated. Spectral Doppler was utilized to evaluate flow at rest and with distal augmentation maneuvers in the common femoral, femoral and popliteal veins. COMPARISON:  None. FINDINGS: RIGHT LOWER EXTREMITY Common Femoral Vein: No evidence of thrombus. Normal compressibility, respiratory phasicity and response to augmentation. Saphenofemoral Junction: No evidence of thrombus. Normal compressibility and flow on color Doppler imaging. Profunda Femoral Vein: No evidence of thrombus. Normal compressibility and flow on color Doppler imaging. Femoral Vein: No evidence of thrombus. Normal compressibility, respiratory phasicity and response to augmentation. Popliteal Vein: No evidence of thrombus. Normal compressibility, respiratory phasicity and response to augmentation. Calf Veins: No evidence of thrombus. Normal compressibility and flow on color Doppler imaging. Superficial Great Saphenous Vein: No evidence of thrombus. Normal compressibility. Venous Reflux:  None. Other Findings:  None. LEFT LOWER EXTREMITY Common Femoral Vein: No evidence of thrombus. Normal compressibility, respiratory phasicity and response to augmentation. Saphenofemoral Junction: No evidence of thrombus. Normal compressibility and flow on color Doppler imaging. Profunda Femoral Vein: No evidence of thrombus. Normal compressibility and flow on color Doppler imaging. Femoral Vein: No evidence of thrombus. Normal compressibility,  respiratory phasicity and response to augmentation. Popliteal Vein: No evidence of thrombus. Normal compressibility, respiratory phasicity and response to augmentation. Calf Veins: No evidence of thrombus. Normal compressibility and flow on color Doppler imaging. Superficial Great Saphenous Vein: No evidence of thrombus. Normal compressibility. Venous Reflux:  None. Other Findings:  None. IMPRESSION: No evidence of deep venous thrombosis in either lower extremity. Electronically Signed   By: Constance Holster M.D.   On: 05/24/2019 14:49   DG Chest Port 1 View  Result Date: 05/25/2019 CLINICAL DATA:  Shortness of breath EXAM: PORTABLE CHEST 1 VIEW COMPARISON:  05/24/2019 FINDINGS: Cardiac shadow is stable. Gastric catheter and endotracheal tube are seen in satisfactory position. Improved aeration is noted within both lungs although persistent opacities remain. No pneumothorax or sizable effusion is seen. IMPRESSION: Tubes and lines as described above. Improved aeration although persistent opacities remain. Electronically Signed   By: Inez Catalina M.D.   On: 05/25/2019 03:20   DG Chest Port 1 View  Result Date: 05/24/2019 CLINICAL DATA:  Status post endotracheal tube repositioning. EXAM: PORTABLE CHEST 1 VIEW COMPARISON:  Film from earlier in the same day. FINDINGS: The endotracheal tube is been further reposition and now lies 3.5 cm above the carina in satisfactory position. The lungs are stable in appearance when compare with the earlier film. No bony abnormality is seen. IMPRESSION: Endotracheal tube is now in satisfactory position. The film is otherwise stable from the most recent exam. Electronically Signed   By: Inez Catalina M.D.   On: 05/24/2019 21:29   DG Chest Port 1 View  Result Date: 05/24/2019 CLINICAL DATA:  Status post endotracheal tube repositioning EXAM: PORTABLE CHEST 1 VIEW COMPARISON:  Film from earlier in the same day. FINDINGS: Cardiac shadow is stable. Persistent pulmonary edema  is  noted but mildly improved when compare with the prior exam. The endotracheal tube has been withdrawn and now lies just above the carina. This could be withdrawn 1-2 cm for better positioning. No pneumothorax is seen. No bony abnormality is noted. IMPRESSION: Interval withdrawal of the endotracheal tube. This should be withdrawn 1-2 cm as described. Improving aeration although persistent edema is seen. Electronically Signed   By: Inez Catalina M.D.   On: 05/24/2019 21:28   DG Chest Port 1 View  Result Date: 05/24/2019 CLINICAL DATA:  Status post intubation EXAM: PORTABLE CHEST 1 VIEW COMPARISON:  05/23/2019 FINDINGS: Endotracheal tube is been placed but lies beyond the carina in the right mainstem bronchus. This should be withdrawn at least 4-5 cm. Cardiac shadow is stable. Diffuse bilateral pulmonary edema is noted worse on the left than the right. No pneumothorax is seen. No bony abnormality is noted. IMPRESSION: Endotracheal tube deep in the right mainstem bronchus. This should be withdrawn as described several cm. Worsening pulmonary edema particularly on the left. These results will be called to the ordering clinician or representative by the Radiologist Assistant, and communication documented in the PACS or zVision Dashboard. Electronically Signed   By: Inez Catalina M.D.   On: 05/24/2019 21:24    ASSESSMENT / PLAN:  Acute hypoxic/hypercarbic respiratory failure secondary to COVID-19 pneumonia  Persistent hypoxia despite full ventilator support Metabolic acidosis- resolved Hx: COPD Continue mechanical ventilation support Prone positioning Recruitment maneuvers as tolerates Continue bronchodilators Add budesonide nebulized Continue Decadron Continue remdesivir Add ivermectin Discontinued sodium bicarb  COVID-19 Pneumonia Monitor fever curve Trend WBC's & Procalcitonin Follow cultures as above Remdesivir Continue Decadron Empiric antibiotics if procalcitonin continues to rise Trend  inflammatory markers Elevated D-dimer: Heparin infusion  AKI on CKD Severe Metabolic Acidosis  Hypomagnesemia Monitor I&O's / urinary output Follow BMP Maintain renal perfusion with attention to hemodynamics Avoid nephrotoxic agents as able Replace electrolytes as indicated Continue IV Fluids CT Renal stone with left kidney stone, but no evidence of obstructive uropathy or hydronephrosis Acidosis and AKI improving, discontinued bicarb infusion Nephrology consult as needed  Hypotension: hypovolemic shock +/- septic shock Mildly elevated Troponin, likely demand ischemia Bradycardia Hx: HTN, CHF, HLD Continuous cardiac monitoring Maintain MAP >65 Volume resuscitated Vasopressors as needed to maintain MAP goal Hold home antihypertensives Trend troponin until downtrending 2D Echocardiogram: 3/1, pending  Avoid negative chronotropic agents  Hyperglycemia CBG's ICU SSI protocol     DISPOSITION: ICU GOALS OF CARE: Full code VTE PROPHYLAXIS: SQ Heparin UPDATES: Updated son and daughter via phone   Multidisciplinary rounds were performed with the ICU team.  Care coordination with Eyvonne Left, RN bedside nurse.   Critical care time 45 minutes.    Renold Don, MD  PCCM 05/25/2019, 9:03 PM    *This note was dictated using voice recognition software/Dragon.  Despite best efforts to proofread, errors can occur which can change the meaning.  Any change was purely unintentional.

## 2019-05-25 NOTE — Progress Notes (Signed)
ABG results on vent. PRVC: Vt 500  RR 18  FIO2 100  PEEP 15  PH: 7.46 PCO2: 35 PaO2: 53 HCO3: 24.9 BE: 1.4  Unable to enter results into Epic at this time.

## 2019-05-25 NOTE — Progress Notes (Signed)
*  PRELIMINARY RESULTS* Echocardiogram 2D Echocardiogram has been performed.  Sherrie Sport 05/25/2019, 9:03 AM

## 2019-05-25 NOTE — Progress Notes (Signed)
Initial Nutrition Assessment RD working remotely.  DOCUMENTATION CODES:   Obesity unspecified  INTERVENTION:  If patient remains intubated recommend initiating Vital High Protein at 20 mL/hr (480 mL goal daily volume) + Pro-Stat 60 mL TID per tube. Provides 1080 kcal, 132, grams of protein, 403 mL H2O daily. With current propofol rate provides 1877 kcal daily.  If tube feeds are initiated recommend minimum free water flush of 30 mL Q4hrs and a liquid MVI daily per tube.  NUTRITION DIAGNOSIS:   Increased nutrient needs related to catabolic [id number] as evidenced by estimated needs.  GOAL:   Provide needs based on ASPEN/SCCM guidelines  MONITOR:   Vent status, Labs, Weight trends, TF tolerance, I & O's  REASON FOR ASSESSMENT:   Ventilator    ASSESSMENT:   64 year old female with PMHx of CHF, COPD, hepatitis C s/p Harvoni treatment admitted with acute hypoxic respiratory failure secondary to COVID-19 PNA requiring intubation on 2/28, also with metabolic acidosis, hypotension, AKI.   Patient is currently intubated on ventilator support MV: 12.4 L/min Temp (24hrs), Avg:99.7 F (37.6 C), Min:97.6 F (36.4 C), Max:102.4 F (39.1 C)  Propofol: 30.2 ml/hr (797 kcal daily)  Medications reviewed and include: vitamin C 500 mg daily, Decadron 6 mg Q24hrs IV, zinc sulfate 220 mg daily, famotidine, fentanyl gtt, heparin gtt, norepinephrine gtt at 14 mcg/min, propofol gtt, remdesivir, vasopressin gtt at 0.03 units/min.  Labs reviewed: BUN 25, Creatinine 1.9.  Enteral Access: OGT  Discussed with RN and on rounds. Plan is to hold off on tube feeds today. Patient may be proned later today.  NUTRITION - FOCUSED PHYSICAL EXAM:  Unable to complete at this time.  Diet Order:   Diet Order            Diet NPO time specified  Diet effective now             EDUCATION NEEDS:   No education needs have been identified at this time  Skin:  Skin Assessment: Reviewed RN  Assessment  Last BM:  05/24/2019 large type 7  Height:   Ht Readings from Last 1 Encounters:  05/24/19 5\' 3"  (1.6 m)   Weight:   Wt Readings from Last 1 Encounters:  05/24/19 100.6 kg   Ideal Body Weight:  52.3 kg  BMI:  Body mass index is 39.29 kg/m.  Estimated Nutritional Needs:   Kcal:  1760-2110  Protein:  120-130 grams  Fluid:  1.5-1.8 L/day  Jacklynn Barnacle, MS, RD, LDN Pager number available on Amion

## 2019-05-25 NOTE — Progress Notes (Signed)
Stopped patient heparin d/t pharmacy heparin level

## 2019-05-25 NOTE — Progress Notes (Signed)
Pt placed in prone position with 4 RN and this RT present. Pt tol change in position well. Will continue to monitor pt closely

## 2019-05-25 NOTE — Progress Notes (Signed)
ANTICOAGULATION CONSULT NOTE - Initial Consult  Pharmacy Consult for heparin Indication: Possible pulmonary embolus  Allergies  Allergen Reactions  . Ibuprofen Other (See Comments)    Reaction:  Unknown   . Nitrofuran Derivatives Other (See Comments)    Reaction:  Unknown   . Ribavirin Nausea And Vomiting    Patient Measurements: Height: 5\' 3"  (160 cm) Weight: 221 lb 12.5 oz (100.6 kg) IBW/kg (Calculated) : 52.4 Heparin Dosing Weight: 72 kg  Vital Signs: Temp: 100 F (37.8 C) (03/01 0400) Temp Source: Axillary (03/01 0400) BP: 93/61 (03/01 0600) Pulse Rate: 55 (03/01 0600)  Labs: Recent Labs     0000 05/23/19 1436 05/23/19 1436 05/23/19 2005 05/23/19 2222 05/24/19 0015 05/24/19 0536 05/24/19 0536 05/24/19 1222 05/24/19 2204 05/25/19 0432  HGB   < > 11.5*  --   --   --   --  10.7*   < >  --  11.0* 11.4*  HCT   < > 34.9*  --   --   --   --  31.1*  --   --  31.9* 33.8*  PLT   < > 221  --   --   --   --  232  --   --  326 371  CREATININE  --  5.34*   < > 4.06*  --    < > 2.54*   < > 2.00* 1.79* 1.90*  TROPONINIHS  --  27*  --  24* 21*  --   --   --   --   --   --    < > = values in this interval not displayed.    Estimated Creatinine Clearance: 34.7 mL/min (A) (by C-G formula based on SCr of 1.9 mg/dL (H)).   Medical History: Past Medical History:  Diagnosis Date  . Benign essential HTN 11/16/2014  . Breathlessness on exertion 09/03/2013  . CHF (congestive heart failure) ([place])   . COPD (chronic obstructive pulmonary disease) ([place])   . Exposure to hepatitis B 12/10/2014  . Heart valve disease 09/03/2013  . Hepatitis C    took Harvoni. now clear.  Marland Kitchen History of cardiac catheterization 03/24/2014   Overview:  With normal coronaries 2015     Medications:  Scheduled:  . vitamin C  500 mg Oral Daily  . Chlorhexidine Gluconate Cloth  6 each Topical Daily  . dexamethasone (DECADRON) injection  6 mg Intravenous Q24H  . etomidate      . fentaNYL      .  mometasone-formoterol  2 puff Inhalation BID  . rocuronium      . sodium chloride flush  3 mL Intravenous Q12H  . vecuronium      . zinc sulfate  220 mg Oral Daily    Assessment: Patient admitted x SOB found to have COVID + pneumonia was suspected to have PE but CT PE not performed d/t AKI on CKD. Patient is being placed on heparin drip for treatment of possible PE d/t elevated ddimer. Patient has also received 4 doses of subq heparin for VTE prophylaxis. CBC trending stably per baseline, aPTT/INR pending.  Goal of Therapy:  Heparin level 0.3-0.7 units/ml Monitor platelets by anticoagulation protocol: Yes   Plan:  Will omit bolus considering patient had 4 doses of subq heparin and not confirmed PE yet. Will start heparin rate at 1200 units/hr. Will check anti-Xa at 1200. Will monitor daily CBC's and adjust per anti-Xa levels.  Tobie Lords, PharmD, BCPS Clinical Pharmacist 05/25/2019,6:08 AM

## 2019-05-25 NOTE — Progress Notes (Signed)
Patient ABG from 0952 recorded pO2 of 62 which has reduced since the prior 74 from yesterday during admission to the ED.  Dr. Duwayne Heck ordered patient to be proned for 16- 24hrs.  Patient HR maintains around 48-49 BPM.  MAP recording around 65.  Dr. Duwayne Heck said to titrate the propofol down from 18mcg and monitor patients HR.  Physician plans to switch patient to versed drip once propofol is at a baseline therapeutic range with patient RASS of -3.

## 2019-05-25 NOTE — Progress Notes (Signed)
Tried to reach pt's daughter again via telephone, however unable to reach her and no voicemail option available.   Darel Hong, AGACNP-BC [facility] Pager: [phone]

## 2019-05-25 NOTE — Progress Notes (Signed)
ANTICOAGULATION CONSULT NOTE  Pharmacy Consult for heparin Indication: Possible pulmonary embolus  Patient Measurements: Height: 5\' 3"  (160 cm) Weight: 221 lb 12.5 oz (100.6 kg) IBW/kg (Calculated) : 52.4 Heparin Dosing Weight: 72 kg  Vital Signs: Temp: 99.9 F (37.7 C) (03/01 0600) Temp Source: Axillary (03/01 0600) BP: 97/56 (03/01 0630) Pulse Rate: 56 (03/01 0630)  Labs: Recent Labs     0000 05/23/19 1436 05/23/19 1436 05/23/19 2005 05/23/19 2222 05/24/19 0015 05/24/19 0536 05/24/19 0536 05/24/19 1222 05/24/19 2204 05/25/19 0430 05/25/19 0432  HGB   < > 11.5*  --   --   --   --  10.7*   < >  --  11.0*  --  11.4*  HCT   < > 34.9*  --   --   --   --  31.1*  --   --  31.9*  --  33.8*  PLT   < > 221  --   --   --   --  232  --   --  326  --  371  APTT  --   --   --   --   --   --   --   --   --   --  39*  --   LABPROT  --   --   --   --   --   --   --   --   --   --  13.9  --   INR  --   --   --   --   --   --   --   --   --   --  1.1  --   CREATININE  --  5.34*   < > 4.06*  --    < > 2.54*   < > 2.00* 1.79*  --  1.90*  TROPONINIHS  --  27*  --  24* 21*  --   --   --   --   --   --   --    < > = values in this interval not displayed.    Estimated Creatinine Clearance: 34.7 mL/min (A) (by C-G formula based on SCr of 1.9 mg/dL (H)).   Medical History: Past Medical History:  Diagnosis Date  . Benign essential HTN 11/16/2014  . Breathlessness on exertion 09/03/2013  . CHF (congestive heart failure) ([place])   . COPD (chronic obstructive pulmonary disease) ([place])   . Exposure to hepatitis B 12/10/2014  . Heart valve disease 09/03/2013  . Hepatitis C    took Harvoni. now clear.  Marland Kitchen History of cardiac catheterization 03/24/2014   Overview:  With normal coronaries 2015     Medications:  Scheduled:  . vitamin C  500 mg Oral Daily  . Chlorhexidine Gluconate Cloth  6 each Topical Daily  . dexamethasone (DECADRON) injection  6 mg Intravenous Q24H  . etomidate      .  fentaNYL      . mometasone-formoterol  2 puff Inhalation BID  . rocuronium      . sodium chloride flush  3 mL Intravenous Q12H  . vecuronium      . zinc sulfate  220 mg Oral Daily    Assessment: Patient admitted x SOB found to have COVID + pneumonia was suspected to have PE but CT PE not performed d/t AKI on CKD. Patient is being placed on heparin drip for treatment of possible PE d/t elevated d-dimer. Patient has also  received 4 doses of subq heparin for VTE prophylaxis. H&H, platelets stable  Heparin Course: 03/01 initiation: no bolus, started at 1200 units/hr 03/01 1229 HL 1.20  Goal of Therapy:  Heparin level 0.3-0.7 units/ml Monitor platelets by anticoagulation protocol: Yes   Plan:   Hold heparin for one hour  Reduce heparin rate to 900 units/hr  Re-check anti-Xa 6 hours after rate change  monitor daily CBCs and adjust per anti-Xa levels.  Vallery Sa, PharmD Clinical Pharmacist 05/25/2019,7:11 AM

## 2019-05-26 ENCOUNTER — Inpatient Hospital Stay: Payer: Medicaid Other

## 2019-05-26 LAB — COMPREHENSIVE METABOLIC PANEL
ALT: 35 U/L (ref 0–44)
AST: 45 U/L — ABNORMAL HIGH (ref 15–41)
Albumin: 3 g/dL — ABNORMAL LOW (ref 3.5–5.0)
Alkaline Phosphatase: 66 U/L (ref 38–126)
Anion gap: 12 (ref 5–15)
BUN: 30 mg/dL — ABNORMAL HIGH (ref 8–23)
CO2: 24 mmol/L (ref 22–32)
Calcium: 7.4 mg/dL — ABNORMAL LOW (ref 8.9–10.3)
Chloride: 110 mmol/L (ref 98–111)
Creatinine, Ser: 1.69 mg/dL — ABNORMAL HIGH (ref 0.44–1.00)
GFR calc Af Amer: 37 mL/min — ABNORMAL LOW (ref >60)
GFR calc non Af Amer: 32 mL/min — ABNORMAL LOW (ref >60)
Glucose, Bld: 196 mg/dL — ABNORMAL HIGH (ref 70–99)
Potassium: 4.8 mmol/L (ref 3.5–5.1)
Sodium: 146 mmol/L — ABNORMAL HIGH (ref 135–145)
Total Bilirubin: 1 mg/dL (ref 0.3–1.2)
Total Protein: 7.5 g/dL (ref 6.5–8.1)

## 2019-05-26 LAB — BLOOD GAS, ARTERIAL
Acid-base deficit: 2.5 mmol/L — ABNORMAL HIGH (ref 0.0–2.0)
Bicarbonate: 25.3 mmol/L (ref 20.0–28.0)
FIO2: 0.9
MECHVT: 450 mL
O2 Saturation: 96.5 %
PEEP: 16 cmH2O
Patient temperature: 37
RATE: 34 resp/min
pCO2 arterial: 55 mmHg — ABNORMAL HIGH (ref 32.0–48.0)
pH, Arterial: 7.27 — ABNORMAL LOW (ref 7.350–7.450)
pO2, Arterial: 97 mmHg (ref 83.0–108.0)

## 2019-05-26 LAB — CBC
HCT: 34.3 % — ABNORMAL LOW (ref 36.0–46.0)
Hemoglobin: 11.1 g/dL — ABNORMAL LOW (ref 12.0–15.0)
MCH: 29.5 pg (ref 26.0–34.0)
MCHC: 32.4 g/dL (ref 30.0–36.0)
MCV: 91.2 fL (ref 80.0–100.0)
Platelets: 348 10*3/uL (ref 150–400)
RBC: 3.76 MIL/uL — ABNORMAL LOW (ref 3.87–5.11)
RDW: 13.7 % (ref 11.5–15.5)
WBC: 25 10*3/uL — ABNORMAL HIGH (ref 4.0–10.5)
nRBC: 0 % (ref 0.0–0.2)

## 2019-05-26 LAB — GLUCOSE, CAPILLARY
Glucose-Capillary: 175 mg/dL — ABNORMAL HIGH (ref 70–99)
Glucose-Capillary: 155 mg/dL — ABNORMAL HIGH (ref 70–99)
Glucose-Capillary: 149 mg/dL — ABNORMAL HIGH (ref 70–99)
Glucose-Capillary: 135 mg/dL — ABNORMAL HIGH (ref 70–99)
Glucose-Capillary: 154 mg/dL — ABNORMAL HIGH (ref 70–99)
Glucose-Capillary: 150 mg/dL — ABNORMAL HIGH (ref 70–99)

## 2019-05-26 LAB — ECHOCARDIOGRAM COMPLETE
Height: 63 in
Weight: 3548.52 oz

## 2019-05-26 LAB — HEPARIN LEVEL (UNFRACTIONATED)
Heparin Unfractionated: 1.48 IU/mL — ABNORMAL HIGH (ref 0.30–0.70)
Heparin Unfractionated: 1.19 IU/mL — ABNORMAL HIGH (ref 0.30–0.70)
Heparin Unfractionated: 0.34 IU/mL (ref 0.30–0.70)

## 2019-05-26 LAB — C-REACTIVE PROTEIN: CRP: 20.1 mg/dL — ABNORMAL HIGH (ref <1.0)

## 2019-05-26 LAB — BRAIN NATRIURETIC PEPTIDE: B Natriuretic Peptide: 94 pg/mL (ref 0.0–100.0)

## 2019-05-26 LAB — TSH: TSH: 0.217 u[IU]/mL — ABNORMAL LOW (ref 0.350–4.500)

## 2019-05-26 LAB — FERRITIN: Ferritin: 1300 ng/mL — ABNORMAL HIGH (ref 11–307)

## 2019-05-26 LAB — T4, FREE: Free T4: 0.94 ng/dL (ref 0.61–1.12)

## 2019-05-26 LAB — FIBRIN DERIVATIVES D-DIMER (ARMC ONLY): Fibrin derivatives D-dimer (ARMC): 1710.83 ng/mL (FEU) — ABNORMAL HIGH (ref 0.00–499.00)

## 2019-05-26 MED ORDER — MIDAZOLAM 50MG/50ML (1MG/ML) PREMIX INFUSION
0.5000 mg/h | INTRAVENOUS | Status: DC
  Administered 2019-05-26: 20:00:00 4 mg/h via INTRAVENOUS
  Administered 2019-05-26: 0.5 mg/h via INTRAVENOUS
  Filled 2019-05-26 (×3): qty 50

## 2019-05-26 MED ORDER — SODIUM CHLORIDE 0.9 % IV SOLN
100.0000 mg | Freq: Two times a day (BID) | INTRAVENOUS | Status: DC
  Administered 2019-05-26 – 2019-05-27 (×2): 100 mg via INTRAVENOUS
  Filled 2019-05-26 (×3): qty 100

## 2019-05-26 MED ORDER — HEPARIN (PORCINE) 25000 UT/250ML-% IV SOLN
450.0000 [IU]/h | INTRAVENOUS | Status: DC
  Administered 2019-05-26 (×2): 700 [IU]/h via INTRAVENOUS
  Filled 2019-05-26: qty 250

## 2019-05-26 MED ORDER — LACTATED RINGERS IV BOLUS
500.0000 mL | Freq: Once | INTRAVENOUS | Status: AC
  Administered 2019-05-26: 13:00:00 500 mL via INTRAVENOUS

## 2019-05-26 MED ORDER — PRO-STAT SUGAR FREE PO LIQD
60.0000 mL | Freq: Three times a day (TID) | ORAL | Status: DC
  Administered 2019-05-26 – 2019-05-27 (×3): 60 mL

## 2019-05-26 MED ORDER — VITAL HIGH PROTEIN PO LIQD
1000.0000 mL | ORAL | Status: DC
  Administered 2019-05-26: 1000 mL

## 2019-05-26 MED ORDER — SODIUM CHLORIDE 0.9 % IV SOLN
2.0000 g | Freq: Two times a day (BID) | INTRAVENOUS | Status: DC
  Administered 2019-05-26 – 2019-05-27 (×3): 2 g via INTRAVENOUS
  Filled 2019-05-26 (×4): qty 2

## 2019-05-26 NOTE — Progress Notes (Signed)
Name: Anne [name] MRN: [id number] DOB: 1955/11/15    ADMISSION DATE:  05/23/2019 ORIGINAL CONSULTATION DATE:  05/23/2019  REFERRING MD :  Sharion Settler, NP  FOLLOW-UP: Respiratory failure, hypoxic, due to COVID-19; ventilator dependence.  BRIEF PATIENT DESCRIPTION:  64 y.o. Female admitted 2/27 with acute hypoxic/hypercarbic respiratory failure secondary to [id number] pneumonia complicated by severe metabolic acidosis, hypotension, along with severe AKI requiring bicarb gtt. failed noninvasive ventilation, intubated now mechanically ventilated.  SIGNIFICANT EVENTS  2/27- Admission to [facility] unit 2/27- Transfer to Stepdown due to hypotension, increasing FiO2 requirements, metabolic acidosis requiring Bicarb gtt 2/28- intubated, initiated mechanical ventilation. Central venous access right femoral placed 3/01- continued issues with oxygenation despite mechanical ventilation, placed in prone position 3/02- oxygenation remains tenuous, resumed supine position, recruitment maneuvers initiated, patient tolerating, initiating potential transfer to [facility]M, spoke with Dr. Kara Mead  STUDIES:  2/27- CXR>>Multifocal pneumonia   2/27- CT Renal Stone Study>> 1. Left nephrolithiasis, without obstructive uropathy. 2. Bibasilar peripheral ground-glass opacities, consistent with COVID-19 pneumonia. More focal nodular opacity in the left lower lobe could represent an area of consolidation or a true pulmonary nodule. Recommend follow-up chest CT at 3 months, after the patient is recovered from the acute illness. 3. Suspicion of cirrhosis, especially given the history of hepatitis B and C. 4.  Aortic Atherosclerosis  CULTURES: POC SARS-CoV-2 2/27>> POSITIVE Blood culture x2 2/27>> negative Strep pneumo urinary antigen 2/27>> negative Legionella urinary antigen 2/27>> negative  ANTIBIOTICS: Cefepime >>3/2  Doxycycline >> 3/2   Allergies  Allergen Reactions  .  Ibuprofen Other (See Comments)    Reaction:  Unknown   . Nitrofuran Derivatives Other (See Comments)    Reaction:  Unknown   . Ribavirin Nausea And Vomiting    REVIEW OF SYSTEMS: Unable to perform due to critical illness, mechanically ventilated status.  SUBJECTIVE:  Intubated, mechanically ventilated. Heavily sedated.  Was switched from prone position to supine position.  During transfer exhibited appropriate movement of all fours and was actually able to nod when addressed.  Not following commands readily.  VITAL SIGNS: Temp:  [95.2 F (35.1 C)-96.8 F (36 C)] 96.8 F (36 C) (03/02 1215) Pulse Rate:  [32-86] 57 (03/02 1436) Resp:  [19-34] 34 (03/02 1436) BP: (78-120)/(34-85) 112/66 (03/02 1400) SpO2:  [76 %-100 %] 99 % (03/02 1436) FiO2 (%):  [90 %] 90 % (03/02 1436)  VENT SETTINGS: Vent Mode: PRVC FiO2 (%):  [90 %] 90 % Set Rate:  [32 bmp-34 bmp] 34 bmp Vt Set:  [400 mL] 400 mL PEEP:  [15 cmH20-16 cmH20] 16 cmH20 Plateau Pressure:  [27 [id number] cmH20] 34 cmH20   PHYSICAL EXAMINATION: General: Critically ill appearing female, obese, intubated, mechanically ventilated.  Initially prone now fine Neuro: Heavily sedated, RASS -2 moving all 4 spontaneously, appropriate grasp, nods when addressed HEENT:  Atraumatic, normocephalic, orotracheally intubated, OG in place Neck: Supple, trachea midline, no crepitus Cardiovascular: Monitor: Sinus bradycardia. Pulses intact distally +2 Lungs:  Unable to auscultate well due to CAPR, no ventilator asynchrony Abdomen:  Obese, soft,nondistended Musculoskeletal:  Normal bulk and tone, no deformities, no edema Skin:  Warm and dry.  No obvious rashes, lesions, or ulcerations  Recent Labs  Lab 05/24/19 2204 05/25/19 0432 05/26/19 0431  NA 144 144 146*  K 4.0 4.5 4.8  CL 108 106 110  CO2 26 27 24   BUN 22 25* 30*  CREATININE 1.79* 1.90* 1.69*  GLUCOSE 152* 229* 196*   Recent Labs  Lab 05/24/19  2204 05/25/19 0432 05/26/19 0431    HGB 11.0* 11.4* 11.1*  HCT 31.9* 33.8* 34.3*  WBC 17.6* 27.1* 25.0*  PLT 326 371 348   DG Abd 1 View  Result Date: 05/25/2019 CLINICAL DATA:  Check gastric catheter placement EXAM: ABDOMEN - 1 VIEW COMPARISON:  None. FINDINGS: Gastric catheter is noted within the stomach. No abnormal mass or abnormal calcifications are seen. No free air is noted. IMPRESSION: Gastric catheter within the stomach. Electronically Signed   By: Inez Catalina M.D.   On: 05/25/2019 03:19   DG Chest Port 1 View  Result Date: 05/26/2019 CLINICAL DATA:  Respiratory failure.  COVID positive. EXAM: PORTABLE CHEST 1 VIEW COMPARISON:  Chest x-ray from yesterday. FINDINGS: Unchanged endotracheal and enteric tubes. Stable cardiomediastinal silhouette. Bilateral interstitial and hazy airspace opacities appear mildly worsened in the right lung and not significantly changed in the left lung. No pleural effusion or pneumothorax. No acute osseous abnormality. IMPRESSION: Multifocal pneumonia, worsened in the right lung when compared to yesterday. Electronically Signed   By: Titus Dubin M.D.   On: 05/26/2019 12:39   DG Chest Port 1 View  Result Date: 05/25/2019 CLINICAL DATA:  Shortness of breath EXAM: PORTABLE CHEST 1 VIEW COMPARISON:  05/24/2019 FINDINGS: Cardiac shadow is stable. Gastric catheter and endotracheal tube are seen in satisfactory position. Improved aeration is noted within both lungs although persistent opacities remain. No pneumothorax or sizable effusion is seen. IMPRESSION: Tubes and lines as described above. Improved aeration although persistent opacities remain. Electronically Signed   By: Inez Catalina M.D.   On: 05/25/2019 03:20   DG Chest Port 1 View  Result Date: 05/24/2019 CLINICAL DATA:  Status post endotracheal tube repositioning. EXAM: PORTABLE CHEST 1 VIEW COMPARISON:  Film from earlier in the same day. FINDINGS: The endotracheal tube is been further reposition and now lies 3.5 cm above the carina in  satisfactory position. The lungs are stable in appearance when compare with the earlier film. No bony abnormality is seen. IMPRESSION: Endotracheal tube is now in satisfactory position. The film is otherwise stable from the most recent exam. Electronically Signed   By: Inez Catalina M.D.   On: 05/24/2019 21:29   DG Chest Port 1 View  Result Date: 05/24/2019 CLINICAL DATA:  Status post endotracheal tube repositioning EXAM: PORTABLE CHEST 1 VIEW COMPARISON:  Film from earlier in the same day. FINDINGS: Cardiac shadow is stable. Persistent pulmonary edema is noted but mildly improved when compare with the prior exam. The endotracheal tube has been withdrawn and now lies just above the carina. This could be withdrawn 1-2 cm for better positioning. No pneumothorax is seen. No bony abnormality is noted. IMPRESSION: Interval withdrawal of the endotracheal tube. This should be withdrawn 1-2 cm as described. Improving aeration although persistent edema is seen. Electronically Signed   By: Inez Catalina M.D.   On: 05/24/2019 21:28   DG Chest Port 1 View  Result Date: 05/24/2019 CLINICAL DATA:  Status post intubation EXAM: PORTABLE CHEST 1 VIEW COMPARISON:  05/23/2019 FINDINGS: Endotracheal tube is been placed but lies beyond the carina in the right mainstem bronchus. This should be withdrawn at least 4-5 cm. Cardiac shadow is stable. Diffuse bilateral pulmonary edema is noted worse on the left than the right. No pneumothorax is seen. No bony abnormality is noted. IMPRESSION: Endotracheal tube deep in the right mainstem bronchus. This should be withdrawn as described several cm. Worsening pulmonary edema particularly on the left. These results will be called to the  ordering clinician or representative by the Radiologist Assistant, and communication documented in the PACS or zVision Dashboard. Electronically Signed   By: Inez Catalina M.D.   On: 05/24/2019 21:24   ECHOCARDIOGRAM COMPLETE  Result Date: 05/26/2019     ECHOCARDIOGRAM REPORT   Patient Name:   Anne [name] Date of Exam: 05/25/2019 Medical Rec #:  [id number]       Height:       63.0 in Accession #:    [id number]      Weight:       221.8 lb Date of Birth:  1955-09-18       BSA:          2.020 m Patient Age:    63 years        BP:           96/52 mmHg Patient Gender: F               HR:           55 bpm. Exam Location:  [facility] Procedure: 2D Echo, Cardiac Doppler and Color Doppler Indications:     CHF 428.0  History:         Patient has no prior history of Echocardiogram examinations.                  CHF; COPD.  Sonographer:     Sherrie Sport RDCS (AE) Referring Phys:  [id number] Bradly Bienenstock Diagnosing Phys: Yolonda Kida MD  Sonographer Comments: Echo performed with patient supine and on artificial respirator. IMPRESSIONS  1. Left ventricular ejection fraction, by estimation, is 70 to 75%. Left ventricular ejection fraction by PLAX is 81 %. The left ventricle has hyperdynamic function. The left ventricle has no regional wall motion abnormalities. There is mild concentric left ventricular hypertrophy. Left ventricular diastolic parameters were normal.  2. Right ventricular systolic function is normal. The right ventricular size is normal. There is normal pulmonary artery systolic pressure.  3. The mitral valve is grossly normal. No evidence of mitral valve regurgitation.  4. The aortic valve is grossly normal. Aortic valve regurgitation is not visualized. FINDINGS  Left Ventricle: Left ventricular ejection fraction, by estimation, is 70 to 75%. Left ventricular ejection fraction by PLAX is 81 % The left ventricle has hyperdynamic function. The left ventricle has no regional wall motion abnormalities. The left ventricular internal cavity size was normal in size. There is mild concentric left ventricular hypertrophy. Left ventricular diastolic parameters were normal. Right Ventricle: The right ventricular size is normal. No increase in right ventricular wall thickness.  Right ventricular systolic function is normal. There is normal pulmonary artery systolic pressure. The tricuspid regurgitant velocity is 1.52 m/s, and  with an assumed right atrial pressure of 10 mmHg, the estimated right ventricular systolic pressure is [id number] mmHg. Left Atrium: Left atrial size was normal in size. Right Atrium: Right atrial size was normal in size. Pericardium: There is no evidence of pericardial effusion. Mitral Valve: The mitral valve is grossly normal. No evidence of mitral valve regurgitation. Tricuspid Valve: The tricuspid valve is grossly normal. Tricuspid valve regurgitation is trivial. Aortic Valve: The aortic valve is grossly normal. Aortic valve regurgitation is not visualized. Aortic valve mean gradient measures 3.5 mmHg. Aortic valve peak gradient measures 7.3 mmHg. Aortic valve area, by VTI measures 2.43 cm. Pulmonic Valve: The pulmonic valve was normal in structure. Pulmonic valve regurgitation is not visualized. Aorta: The aortic root is normal in size and structure. IAS/Shunts:  No atrial level shunt detected by color flow Doppler.  LEFT VENTRICLE PLAX 2D LV EF:         Left            Diastology                ventricular     LV e' lateral:   8.49 cm/s                ejection        LV E/e' lateral: 8.3                fraction by     LV e' medial:    5.77 cm/s                PLAX is 81 %    LV E/e' medial:  12.2 LVIDd:         5.21 cm LVIDs:         2.59 cm LV PW:         1.05 cm LV IVS:        1.03 cm LVOT diam:     2.00 cm LV SV:         59 LV SV Index:   29 LVOT Area:     3.14 cm  RIGHT VENTRICLE RV S prime:     12.50 cm/s TAPSE (M-mode): 2.6 cm LEFT ATRIUM           Index       RIGHT ATRIUM           Index LA diam:      3.50 cm 1.73 cm/m  RA Area:     14.20 cm LA Vol (A4C): 31.5 ml 15.59 ml/m RA Volume:   34.00 ml  16.83 ml/m  AORTIC VALVE                   PULMONIC VALVE AV Area (Vmax):    1.82 cm    RVOT Peak grad: 3 mmHg AV Area (Vmean):   1.85 cm AV Area (VTI):      2.43 cm AV Vmax:           135.00 cm/s AV Vmean:          88.550 cm/s AV VTI:            0.244 m AV Peak Grad:      7.3 mmHg AV Mean Grad:      3.5 mmHg LVOT Vmax:         78.30 cm/s LVOT Vmean:        52.200 cm/s LVOT VTI:          0.188 m LVOT/AV VTI ratio: 0.77  AORTA Ao Root diam: 2.50 cm MITRAL VALVE               TRICUSPID VALVE MV Area (PHT): 2.44 cm    TR Peak grad:   9.2 mmHg MV Decel Time: 311 msec    TR Vmax:        152.00 cm/s MV E velocity: 70.30 cm/s MV A velocity: 56.80 cm/s  SHUNTS MV E/A ratio:  1.24        Systemic VTI:  0.19 m                            Systemic Diam: 2.00 cm Yolonda Kida MD Electronically signed by Yolonda Kida MD Signature Date/Time: 05/26/2019/8:21:02  AM    Final     ASSESSMENT / PLAN:  Acute hypoxic/hypercarbic respiratory failure secondary to COVID-19 pneumonia  Persistent hypoxia despite full ventilator support Metabolic acidosis- resolved Hx: COPD Continue mechanical ventilation support Placed back on supine positioning after 16 hours of prone positioning Recruitment maneuvers performed and tolerated, every 4 and as needed discussed with RT Continue bronchodilators Continue budesonide nebulized Continue Decadron Continue remdesivir (last day will be 3/5) Ivermectin dosing 3/1, 3/3, 3/5, 3/7 Arterial blood gas after resuming supine position and recruitment showed improvement in PaO2 Potential transfer to ICU 34M [place]  COVID-19 Pneumonia Query superimposed bacterial pneumonia Monitor fever curve Has developed leukocytosis and procalcitonin slightly elevated Empiric cefepime/doxycycline (avoiding QT prolonging antibiotics) Follow cultures as above Continue remdesivir (last dose 3/5) Continue Decadron Continue to trend inflammatory markers Elevated D-dimer: Continue heparin infusion  AKI on CKD Metabolic Acidosis: Resolved Hypomagnesemia Monitor I&O's / urinary output Follow BMP Maintain renal perfusion with attention to  hemodynamics Avoid nephrotoxic agents as able Replace electrolytes as indicated, magnesium level pending Continue IV Fluids, LR challenge today BUN 30 creatinine 1.69 (baseline creatinine 1.3) Nephrology consult as needed  Shock physiology resolved Mildly elevated Troponin, likely demand ischemia Bradycardia Hx: HTN, CHF, HLD Continuous cardiac monitoring Maintain MAP >65 Volume resuscitated Vasopressors as needed to maintain MAP goal Hold home antihypertensives Trend troponin until downtrending 2D Echocardiogram 3/1: LVEF 70 to 75%.  Hyperdynamic left ventricular function, left concentric ventricular hypertrophy, diastology normal normal right ventricular function.  Normal valve function Avoid negative chronotropic agents due to bradycardia Review of [facility] records shows that she has had bradycardia since 2017, etiology unknown Check TSH/free T4 Switch sedation from propofol to Versed gtt  Hyperglycemia CBG's ICU SSI protocol  Nutrition Tube feed protocol Constipation protocol   DISPOSITION: ICU GOALS OF CARE: Full code VTE PROPHYLAXIS: SQ Heparin UPDATES: Updated son and daughter via phone and advised of potential transfer to [facility].  Daughter Areale, will be primary contact.  Phone number updated [phone].   Multidisciplinary rounds were performed with the ICU team.  Care coordination with Eyvonne Left, RN bedside nurse.   Critical care time 45 minutes.    Renold Don, MD [facility] 05/26/2019, 3:04 PM    *This note was dictated using voice recognition software/Dragon.  Despite best efforts to proofread, errors can occur which can change the meaning.  Any change was purely unintentional.

## 2019-05-26 NOTE — Progress Notes (Signed)
ANTICOAGULATION CONSULT NOTE  Pharmacy Consult for heparin Indication: Possible pulmonary embolus  Patient Measurements: Height: 5' 2.99" (160 cm) Weight: 221 lb 12.5 oz (100.6 kg) IBW/kg (Calculated) : 52.38 Heparin Dosing Weight: 72 kg  Vital Signs: Temp: 98.1 F (36.7 C) (03/02 2200) Temp Source: Rectal (03/02 2000) BP: 102/64 (03/02 2200) Pulse Rate: 65 (03/02 2200)  Labs: Recent Labs    05/24/19 2204 05/24/19 2204 05/25/19 0430 05/25/19 0432 05/25/19 1229 05/25/19 2256 05/26/19 0431 05/26/19 0810 05/26/19 2200  HGB 11.0*   < >  --  11.4*  --   --  11.1*  --   --   HCT 31.9*  --   --  33.8*  --   --  34.3*  --   --   PLT 326  --   --  371  --   --  348  --   --   APTT  --   --  39*  --   --   --   --   --   --   LABPROT  --   --  13.9  --   --   --   --   --   --   INR  --   --  1.1  --   --   --   --   --   --   HEPARINUNFRC  --   --   --   --    < > 1.48*  --  1.19* 0.34  CREATININE 1.79*  --   --  1.90*  --   --  1.69*  --   --    < > = values in this interval not displayed.    Estimated Creatinine Clearance: 39.1 mL/min (A) (by C-G formula based on SCr of 1.69 mg/dL (H)).   Medical History: Past Medical History:  Diagnosis Date  . Benign essential HTN 11/16/2014  . Breathlessness on exertion 09/03/2013  . CHF (congestive heart failure) ([place])   . COPD (chronic obstructive pulmonary disease) ([place])   . Exposure to hepatitis B 12/10/2014  . Heart valve disease 09/03/2013  . Hepatitis C    took Harvoni. now clear.  Marland Kitchen History of cardiac catheterization 03/24/2014   Overview:  With normal coronaries 2015     Medications:  Scheduled:  . vitamin C  500 mg Oral Daily  . budesonide (PULMICORT) nebulizer solution  0.25 mg Nebulization BID  . chlorhexidine gluconate (MEDLINE KIT)  15 mL Mouth Rinse BID  . Chlorhexidine Gluconate Cloth  6 each Topical Daily  . Chlorhexidine Gluconate Cloth  6 each Topical Q0600  . dexamethasone (DECADRON) injection  6 mg  Intravenous Q24H  . feeding supplement (PRO-STAT SUGAR FREE 64)  60 mL Per Tube TID  . feeding supplement (VITAL HIGH PROTEIN)  1,000 mL Per Tube Q24H  . insulin aspart  1-3 Units Subcutaneous Q4H  . ipratropium-albuterol  3 mL Nebulization Q6H  . ivermectin  200 mcg/kg Oral QODAY  . mouth rinse  15 mL Mouth Rinse 10 times per day  . Melatonin  10 mg Oral QHS  . mupirocin ointment  1 application Nasal BID  . sodium chloride flush  10-40 mL Intracatheter Q12H  . sodium chloride flush  3 mL Intravenous Q12H  . zinc sulfate  220 mg Oral Daily    Assessment: Patient admitted x SOB found to have COVID + pneumonia was suspected to have PE but CT PE not performed d/t AKI on CKD.  Patient is being placed on heparin drip for treatment of possible PE d/t elevated d-dimer.  H&H lower than baseline but stable, platelets stable  Heparin Course: 03/01 initiation: no bolus, started at 1200 units/hr 03/01 1229 HL 1.20: reduced to 900 units/hr 03/01 2256 HL 1.48: reduced to 700 units/hr 03/02 0810 HL 1.19: reduced to 450 units/hr 03/02 2200 HL 0.34: therapeutic x 1.  Continue current rate and recheck HL in am to confirm  Goal of Therapy:  Heparin level 0.3-0.7 units/ml Monitor platelets by anticoagulation protocol: Yes   Plan:   Continue Heparin at current rate  Re-check anti-Xa in am to confirm  monitor daily CBCs and adjust per anti-Xa levels.  Hart Robinsons, PharmD Clinical Pharmacist 05/26/2019,10:45 PM

## 2019-05-26 NOTE — Progress Notes (Signed)
Patient was turned on her back today during the early afternoon.  Patient ABG recorded her pO2 around 96.  This was a significant improvement to the prior ABG of pO2 54.  Propofol was discontinued and replaced with versed titrated to 4mg /hr.  Patient Levo is still infusing at 41mcg/min with vitals WDL and MAP above 65.  Vital high protein is now infusing @ 81ml/hr.  Delivered protstat substitute.  Patient UOP for shift approx 1047ml.  Changed IV lines to reduce microbial growth.  Patient now RASS of -4 with goal of -3.

## 2019-05-26 NOTE — Progress Notes (Signed)
Recruitment maneuver completed per MD specifications. Pt tol well with HR maintained in mid/low50's

## 2019-05-26 NOTE — Progress Notes (Signed)
Pt placed in supine position by 4 nursing staff and this RT. MD present in room. Recruitment maneuver completed my MD post turning.

## 2019-05-26 NOTE — Progress Notes (Signed)
ANTICOAGULATION CONSULT NOTE  Pharmacy Consult for heparin Indication: Possible pulmonary embolus  Patient Measurements: Height: '5\' 3"'$  (160 cm) Weight: 221 lb 12.5 oz (100.6 kg) IBW/kg (Calculated) : 52.4 Heparin Dosing Weight: 72 kg  Vital Signs: Temp: 96.1 F (35.6 C) (03/01 2000) Temp Source: Rectal (03/01 2000) BP: 96/60 (03/01 2000) Pulse Rate: 42 (03/01 2000)  Labs: Recent Labs     0000 05/23/19 1436 05/23/19 1436 05/23/19 2005 05/23/19 2222 05/24/19 0015 05/24/19 0536 05/24/19 0536 05/24/19 1222 05/24/19 2204 05/25/19 0430 05/25/19 0432 05/25/19 1229 05/25/19 2256  HGB   < > 11.5*  --   --   --   --  10.7*   < >  --  11.0*  --  11.4*  --   --   HCT   < > 34.9*  --   --   --   --  31.1*  --   --  31.9*  --  33.8*  --   --   PLT   < > 221  --   --   --   --  232  --   --  326  --  371  --   --   APTT  --   --   --   --   --   --   --   --   --   --  39*  --   --   --   LABPROT  --   --   --   --   --   --   --   --   --   --  13.9  --   --   --   INR  --   --   --   --   --   --   --   --   --   --  1.1  --   --   --   HEPARINUNFRC  --   --   --   --   --   --   --   --   --   --   --   --  1.20* 1.48*  CREATININE  --  5.34*   < > 4.06*  --    < > 2.54*   < > 2.00* 1.79*  --  1.90*  --   --   TROPONINIHS  --  27*  --  24* 21*  --   --   --   --   --   --   --   --   --    < > = values in this interval not displayed.    Estimated Creatinine Clearance: 34.7 mL/min (A) (by C-G formula based on SCr of 1.9 mg/dL (H)).   Medical History: Past Medical History:  Diagnosis Date  . Benign essential HTN 11/16/2014  . Breathlessness on exertion 09/03/2013  . CHF (congestive heart failure) ([place])   . COPD (chronic obstructive pulmonary disease) ([place])   . Exposure to hepatitis B 12/10/2014  . Heart valve disease 09/03/2013  . Hepatitis C    took Harvoni. now clear.  Marland Kitchen History of cardiac catheterization 03/24/2014   Overview:  With normal coronaries 2015      Medications:  Scheduled:  . vitamin C  500 mg Oral Daily  . budesonide (PULMICORT) nebulizer solution  0.25 mg Nebulization BID  . chlorhexidine gluconate (MEDLINE KIT)  15 mL Mouth Rinse BID  . Chlorhexidine Gluconate Cloth  6 each Topical Daily  . Chlorhexidine Gluconate Cloth  6 each Topical Q0600  . dexamethasone (DECADRON) injection  6 mg Intravenous Q24H  . insulin aspart  1-3 Units Subcutaneous Q4H  . ipratropium-albuterol  3 mL Nebulization Q6H  . ivermectin  200 mcg/kg Oral QODAY  . mouth rinse  15 mL Mouth Rinse 10 times per day  . Melatonin  10 mg Oral QHS  . mupirocin ointment  1 application Nasal BID  . sodium chloride flush  10-40 mL Intracatheter Q12H  . sodium chloride flush  3 mL Intravenous Q12H  . zinc sulfate  220 mg Oral Daily    Assessment: Patient admitted x SOB found to have COVID + pneumonia was suspected to have PE but CT PE not performed d/t AKI on CKD. Patient is being placed on heparin drip for treatment of possible PE d/t elevated d-dimer. Patient has also received 4 doses of subq heparin for VTE prophylaxis. H&H, platelets stable  Heparin Course: 03/01 initiation: no bolus, started at 1200 units/hr 03/01 1229 HL 1.20 03/01 2256 HL 1.48, SUPRAtherapeutic  Goal of Therapy:  Heparin level 0.3-0.7 units/ml Monitor platelets by anticoagulation protocol: Yes   Plan:   Hold heparin for one hour  Reduce heparin rate to 700 units/hr  Re-check anti-Xa 6 hours after rate change  monitor daily CBCs and adjust per anti-Xa levels.  Hart Robinsons, PharmD Clinical Pharmacist 05/26/2019,1:34 AM

## 2019-05-26 NOTE — Progress Notes (Signed)
Pt head turned to face the left at this time

## 2019-05-26 NOTE — Progress Notes (Signed)
Recruitment maneuver performed. Tolerated well. Will continue to monitor.

## 2019-05-26 NOTE — Progress Notes (Signed)
ANTICOAGULATION CONSULT NOTE  Pharmacy Consult for heparin Indication: Possible pulmonary embolus  Patient Measurements: Height: '5\' 3"'$  (160 cm) Weight: 221 lb 12.5 oz (100.6 kg) IBW/kg (Calculated) : 52.4 Heparin Dosing Weight: 72 kg  Vital Signs: Temp: 95.2 F (35.1 C) (03/02 0500) Temp Source: Rectal (03/01 2000) BP: 108/55 (03/02 0500) Pulse Rate: 53 (03/02 0500)  Labs: Recent Labs    05/23/19 1436 05/23/19 1436 05/23/19 2005 05/23/19 2222 05/24/19 0015 05/24/19 2204 05/24/19 2204 05/25/19 0430 05/25/19 0432 05/25/19 1229 05/25/19 2256 05/26/19 0431  HGB 11.5*  --   --   --    < > 11.0*   < >  --  11.4*  --   --  11.1*  HCT 34.9*  --   --   --    < > 31.9*  --   --  33.8*  --   --  34.3*  PLT 221  --   --   --    < > 326  --   --  371  --   --  348  APTT  --   --   --   --   --   --   --  39*  --   --   --   --   LABPROT  --   --   --   --   --   --   --  13.9  --   --   --   --   INR  --   --   --   --   --   --   --  1.1  --   --   --   --   HEPARINUNFRC  --   --   --   --   --   --   --   --   --  1.20* 1.48*  --   CREATININE 5.34*   < > 4.06*  --    < > 1.79*  --   --  1.90*  --   --  1.69*  TROPONINIHS 27*  --  24* 21*  --   --   --   --   --   --   --   --    < > = values in this interval not displayed.    Estimated Creatinine Clearance: 39.1 mL/min (A) (by C-G formula based on SCr of 1.69 mg/dL (H)).   Medical History: Past Medical History:  Diagnosis Date  . Benign essential HTN 11/16/2014  . Breathlessness on exertion 09/03/2013  . CHF (congestive heart failure) ([place])   . COPD (chronic obstructive pulmonary disease) ([place])   . Exposure to hepatitis B 12/10/2014  . Heart valve disease 09/03/2013  . Hepatitis C    took Harvoni. now clear.  Marland Kitchen History of cardiac catheterization 03/24/2014   Overview:  With normal coronaries 2015     Medications:  Scheduled:  . vitamin C  500 mg Oral Daily  . budesonide (PULMICORT) nebulizer solution  0.25 mg  Nebulization BID  . chlorhexidine gluconate (MEDLINE KIT)  15 mL Mouth Rinse BID  . Chlorhexidine Gluconate Cloth  6 each Topical Daily  . Chlorhexidine Gluconate Cloth  6 each Topical Q0600  . dexamethasone (DECADRON) injection  6 mg Intravenous Q24H  . insulin aspart  1-3 Units Subcutaneous Q4H  . ipratropium-albuterol  3 mL Nebulization Q6H  . ivermectin  200 mcg/kg Oral QODAY  . mouth rinse  15 mL Mouth  Rinse 10 times per day  . Melatonin  10 mg Oral QHS  . mupirocin ointment  1 application Nasal BID  . sodium chloride flush  10-40 mL Intracatheter Q12H  . sodium chloride flush  3 mL Intravenous Q12H  . zinc sulfate  220 mg Oral Daily    Assessment: Patient admitted x SOB found to have COVID + pneumonia was suspected to have PE but CT PE not performed d/t AKI on CKD. Patient is being placed on heparin drip for treatment of possible PE d/t elevated d-dimer.  H&H lower than baseline but stable, platelets stable  Heparin Course: 03/01 initiation: no bolus, started at 1200 units/hr 03/01 1229 HL 1.20: reduced to 900 units/hr 03/01 2256 HL 1.48: reduced to 700 units/hr 03/02 0810 HL 1.19: reduced to 450 units/hr  Goal of Therapy:  Heparin level 0.3-0.7 units/ml Monitor platelets by anticoagulation protocol: Yes   Plan:   Hold heparin for one hour  Reduce heparin rate to 450 units/hr  Re-check anti-Xa 6 hours after rate change  monitor daily CBCs and adjust per anti-Xa levels.  Vallery Sa, PharmD Clinical Pharmacist 05/26/2019,7:12 AM

## 2019-05-26 NOTE — Progress Notes (Signed)
Pt head turned to face the right at this time

## 2019-05-27 ENCOUNTER — Inpatient Hospital Stay (HOSPITAL_COMMUNITY): Payer: Medicaid Other

## 2019-05-27 ENCOUNTER — Other Ambulatory Visit: Payer: Self-pay

## 2019-05-27 ENCOUNTER — Inpatient Hospital Stay (HOSPITAL_COMMUNITY)
Admission: AD | Admit: 2019-05-27 | Discharge: 2019-06-25 | DRG: 207 | Disposition: E | Payer: Medicaid Other | Source: Other Acute Inpatient Hospital | Attending: Pulmonary Disease | Admitting: Pulmonary Disease

## 2019-05-27 ENCOUNTER — Encounter (HOSPITAL_COMMUNITY): Payer: Self-pay | Admitting: Pulmonary Disease

## 2019-05-27 ENCOUNTER — Inpatient Hospital Stay: Payer: Medicaid Other

## 2019-05-27 DIAGNOSIS — N179 Acute kidney failure, unspecified: Secondary | ICD-10-CM

## 2019-05-27 DIAGNOSIS — E870 Hyperosmolality and hypernatremia: Secondary | ICD-10-CM | POA: Diagnosis not present

## 2019-05-27 DIAGNOSIS — B1920 Unspecified viral hepatitis C without hepatic coma: Secondary | ICD-10-CM | POA: Diagnosis present

## 2019-05-27 DIAGNOSIS — Z66 Do not resuscitate: Secondary | ICD-10-CM | POA: Diagnosis not present

## 2019-05-27 DIAGNOSIS — E875 Hyperkalemia: Secondary | ICD-10-CM | POA: Diagnosis not present

## 2019-05-27 DIAGNOSIS — Z1621 Resistance to vancomycin: Secondary | ICD-10-CM | POA: Diagnosis not present

## 2019-05-27 DIAGNOSIS — Y95 Nosocomial condition: Secondary | ICD-10-CM | POA: Diagnosis not present

## 2019-05-27 DIAGNOSIS — J159 Unspecified bacterial pneumonia: Secondary | ICD-10-CM | POA: Diagnosis present

## 2019-05-27 DIAGNOSIS — J9690 Respiratory failure, unspecified, unspecified whether with hypoxia or hypercapnia: Secondary | ICD-10-CM

## 2019-05-27 DIAGNOSIS — N170 Acute kidney failure with tubular necrosis: Secondary | ICD-10-CM | POA: Diagnosis not present

## 2019-05-27 DIAGNOSIS — U071 COVID-19: Principal | ICD-10-CM

## 2019-05-27 DIAGNOSIS — J1282 Pneumonia due to coronavirus disease 2019: Secondary | ICD-10-CM | POA: Diagnosis present

## 2019-05-27 DIAGNOSIS — K5900 Constipation, unspecified: Secondary | ICD-10-CM | POA: Diagnosis present

## 2019-05-27 DIAGNOSIS — J440 Chronic obstructive pulmonary disease with acute lower respiratory infection: Secondary | ICD-10-CM | POA: Diagnosis not present

## 2019-05-27 DIAGNOSIS — J9601 Acute respiratory failure with hypoxia: Secondary | ICD-10-CM

## 2019-05-27 DIAGNOSIS — Z4659 Encounter for fitting and adjustment of other gastrointestinal appliance and device: Secondary | ICD-10-CM

## 2019-05-27 DIAGNOSIS — G9341 Metabolic encephalopathy: Secondary | ICD-10-CM | POA: Diagnosis present

## 2019-05-27 DIAGNOSIS — A4189 Other specified sepsis: Secondary | ICD-10-CM | POA: Diagnosis not present

## 2019-05-27 DIAGNOSIS — G931 Anoxic brain damage, not elsewhere classified: Secondary | ICD-10-CM | POA: Diagnosis not present

## 2019-05-27 DIAGNOSIS — N390 Urinary tract infection, site not specified: Secondary | ICD-10-CM | POA: Diagnosis not present

## 2019-05-27 DIAGNOSIS — Z9911 Dependence on respirator [ventilator] status: Secondary | ICD-10-CM

## 2019-05-27 DIAGNOSIS — I130 Hypertensive heart and chronic kidney disease with heart failure and stage 1 through stage 4 chronic kidney disease, or unspecified chronic kidney disease: Secondary | ICD-10-CM | POA: Diagnosis not present

## 2019-05-27 DIAGNOSIS — R0902 Hypoxemia: Secondary | ICD-10-CM

## 2019-05-27 DIAGNOSIS — E669 Obesity, unspecified: Secondary | ICD-10-CM | POA: Diagnosis present

## 2019-05-27 DIAGNOSIS — E785 Hyperlipidemia, unspecified: Secondary | ICD-10-CM | POA: Diagnosis present

## 2019-05-27 DIAGNOSIS — I5032 Chronic diastolic (congestive) heart failure: Secondary | ICD-10-CM | POA: Diagnosis not present

## 2019-05-27 DIAGNOSIS — R6521 Severe sepsis with septic shock: Secondary | ICD-10-CM | POA: Diagnosis not present

## 2019-05-27 DIAGNOSIS — R739 Hyperglycemia, unspecified: Secondary | ICD-10-CM | POA: Diagnosis present

## 2019-05-27 DIAGNOSIS — E162 Hypoglycemia, unspecified: Secondary | ICD-10-CM | POA: Diagnosis not present

## 2019-05-27 DIAGNOSIS — J9600 Acute respiratory failure, unspecified whether with hypoxia or hypercapnia: Secondary | ICD-10-CM | POA: Diagnosis not present

## 2019-05-27 DIAGNOSIS — N1832 Chronic kidney disease, stage 3b: Secondary | ICD-10-CM | POA: Diagnosis not present

## 2019-05-27 DIAGNOSIS — N200 Calculus of kidney: Secondary | ICD-10-CM | POA: Diagnosis present

## 2019-05-27 DIAGNOSIS — J189 Pneumonia, unspecified organism: Secondary | ICD-10-CM

## 2019-05-27 DIAGNOSIS — K567 Ileus, unspecified: Secondary | ICD-10-CM

## 2019-05-27 DIAGNOSIS — J80 Acute respiratory distress syndrome: Secondary | ICD-10-CM | POA: Diagnosis present

## 2019-05-27 DIAGNOSIS — K7469 Other cirrhosis of liver: Secondary | ICD-10-CM | POA: Diagnosis present

## 2019-05-27 DIAGNOSIS — R001 Bradycardia, unspecified: Secondary | ICD-10-CM | POA: Diagnosis not present

## 2019-05-27 DIAGNOSIS — D6489 Other specified anemias: Secondary | ICD-10-CM | POA: Diagnosis present

## 2019-05-27 DIAGNOSIS — B952 Enterococcus as the cause of diseases classified elsewhere: Secondary | ICD-10-CM | POA: Diagnosis present

## 2019-05-27 DIAGNOSIS — D6959 Other secondary thrombocytopenia: Secondary | ICD-10-CM | POA: Diagnosis not present

## 2019-05-27 DIAGNOSIS — J982 Interstitial emphysema: Secondary | ICD-10-CM | POA: Diagnosis not present

## 2019-05-27 DIAGNOSIS — I248 Other forms of acute ischemic heart disease: Secondary | ICD-10-CM | POA: Diagnosis not present

## 2019-05-27 DIAGNOSIS — E872 Acidosis: Secondary | ICD-10-CM | POA: Diagnosis present

## 2019-05-27 DIAGNOSIS — Z6841 Body Mass Index (BMI) 40.0 and over, adult: Secondary | ICD-10-CM | POA: Diagnosis not present

## 2019-05-27 DIAGNOSIS — E878 Other disorders of electrolyte and fluid balance, not elsewhere classified: Secondary | ICD-10-CM | POA: Diagnosis present

## 2019-05-27 DIAGNOSIS — I700 Atherosclerosis of aorta: Secondary | ICD-10-CM | POA: Diagnosis present

## 2019-05-27 DIAGNOSIS — Z978 Presence of other specified devices: Secondary | ICD-10-CM

## 2019-05-27 LAB — PHOSPHORUS: Phosphorus: 1.9 mg/dL — ABNORMAL LOW (ref 2.5–4.6)

## 2019-05-27 LAB — GLUCOSE, CAPILLARY
Glucose-Capillary: 141 mg/dL — ABNORMAL HIGH (ref 70–99)
Glucose-Capillary: 145 mg/dL — ABNORMAL HIGH (ref 70–99)
Glucose-Capillary: 135 mg/dL — ABNORMAL HIGH (ref 70–99)
Glucose-Capillary: 110 mg/dL — ABNORMAL HIGH (ref 70–99)
Glucose-Capillary: 124 mg/dL — ABNORMAL HIGH (ref 70–99)
Glucose-Capillary: 163 mg/dL — ABNORMAL HIGH (ref 70–99)

## 2019-05-27 LAB — BLOOD GAS, ARTERIAL
Acid-base deficit: 1.1 mmol/L (ref 0.0–2.0)
Bicarbonate: 26.1 mmol/L (ref 20.0–28.0)
FIO2: 0.9
MECHVT: 400 mL
O2 Saturation: 98.1 %
PEEP: 16 cmH2O
Patient temperature: 37
RATE: 34 resp/min
pCO2 arterial: 53 mmHg — ABNORMAL HIGH (ref 32.0–48.0)
pH, Arterial: 7.3 — ABNORMAL LOW (ref 7.350–7.450)
pO2, Arterial: 115 mmHg — ABNORMAL HIGH (ref 83.0–108.0)

## 2019-05-27 LAB — RENAL FUNCTION PANEL
Albumin: 2.7 g/dL — ABNORMAL LOW (ref 3.5–5.0)
Anion gap: 10 (ref 5–15)
BUN: 31 mg/dL — ABNORMAL HIGH (ref 8–23)
CO2: 24 mmol/L (ref 22–32)
Calcium: 7.6 mg/dL — ABNORMAL LOW (ref 8.9–10.3)
Chloride: 115 mmol/L — ABNORMAL HIGH (ref 98–111)
Creatinine, Ser: 1.46 mg/dL — ABNORMAL HIGH (ref 0.44–1.00)
GFR calc Af Amer: 44 mL/min — ABNORMAL LOW (ref >60)
GFR calc non Af Amer: 38 mL/min — ABNORMAL LOW (ref >60)
Glucose, Bld: 158 mg/dL — ABNORMAL HIGH (ref 70–99)
Phosphorus: 1.9 mg/dL — ABNORMAL LOW (ref 2.5–4.6)
Potassium: 5 mmol/L (ref 3.5–5.1)
Sodium: 149 mmol/L — ABNORMAL HIGH (ref 135–145)

## 2019-05-27 LAB — CBC
HCT: 29.8 % — ABNORMAL LOW (ref 36.0–46.0)
Hemoglobin: 9.4 g/dL — ABNORMAL LOW (ref 12.0–15.0)
MCH: 29.4 pg (ref 26.0–34.0)
MCHC: 31.5 g/dL (ref 30.0–36.0)
MCV: 93.1 fL (ref 80.0–100.0)
Platelets: 303 10*3/uL (ref 150–400)
RBC: 3.2 MIL/uL — ABNORMAL LOW (ref 3.87–5.11)
RDW: 13.8 % (ref 11.5–15.5)
WBC: 16.7 10*3/uL — ABNORMAL HIGH (ref 4.0–10.5)
nRBC: 0.2 % (ref 0.0–0.2)

## 2019-05-27 LAB — D-DIMER, QUANTITATIVE: D-Dimer, Quant: 2.09 ug/mL-FEU — ABNORMAL HIGH (ref 0.00–0.50)

## 2019-05-27 LAB — MAGNESIUM
Magnesium: 1.9 mg/dL (ref 1.7–2.4)
Magnesium: 2 mg/dL (ref 1.7–2.4)

## 2019-05-27 LAB — C-REACTIVE PROTEIN: CRP: 11.5 mg/dL — ABNORMAL HIGH (ref <1.0)

## 2019-05-27 LAB — HEMOGLOBIN A1C
Hgb A1c MFr Bld: 5.7 % — ABNORMAL HIGH (ref 4.8–5.6)
Mean Plasma Glucose: 116.89 mg/dL

## 2019-05-27 LAB — HEPARIN LEVEL (UNFRACTIONATED): Heparin Unfractionated: 0.35 IU/mL (ref 0.30–0.70)

## 2019-05-27 LAB — FIBRIN DERIVATIVES D-DIMER (ARMC ONLY): Fibrin derivatives D-dimer (ARMC): 2083.91 ng/mL (FEU) — ABNORMAL HIGH (ref 0.00–499.00)

## 2019-05-27 MED ORDER — INSULIN ASPART 100 UNIT/ML ~~LOC~~ SOLN
0.0000 [IU] | SUBCUTANEOUS | Status: DC
  Administered 2019-05-27: 2 [IU] via SUBCUTANEOUS

## 2019-05-27 MED ORDER — VASOPRESSIN 20 UNIT/ML IV SOLN
0.0300 [IU]/min | INTRAVENOUS | Status: DC
  Filled 2019-05-27: qty 2

## 2019-05-27 MED ORDER — POTASSIUM & SODIUM PHOSPHATES 280-160-250 MG PO PACK
2.0000 | PACK | Freq: Three times a day (TID) | ORAL | Status: DC
  Administered 2019-05-27: 2
  Filled 2019-05-27 (×2): qty 2

## 2019-05-27 MED ORDER — FENTANYL BOLUS VIA INFUSION
50.0000 ug | INTRAVENOUS | Status: DC | PRN
  Administered 2019-05-29 – 2019-05-30 (×9): 50 ug via INTRAVENOUS
  Filled 2019-05-27: qty 50

## 2019-05-27 MED ORDER — ORAL CARE MOUTH RINSE
15.0000 mL | OROMUCOSAL | Status: DC
  Administered 2019-05-27 – 2019-06-13 (×173): 15 mL via OROMUCOSAL

## 2019-05-27 MED ORDER — DEXTROSE-NACL 5-0.45 % IV SOLN: INTRAVENOUS | Status: DC

## 2019-05-27 MED ORDER — FAMOTIDINE IN NACL 20-0.9 MG/50ML-% IV SOLN: 20.0000 mg | Freq: Every day | INTRAVENOUS | Status: DC

## 2019-05-27 MED ORDER — HEPARIN (PORCINE) 25000 UT/250ML-% IV SOLN
1300.0000 [IU]/h | INTRAVENOUS | Status: DC
  Administered 2019-05-28: 600 [IU]/h via INTRAVENOUS
  Filled 2019-05-27 (×3): qty 250

## 2019-05-27 MED ORDER — MIDAZOLAM 50MG/50ML (1MG/ML) PREMIX INFUSION: 0.5000 mg/h | INTRAVENOUS | Status: DC

## 2019-05-27 MED ORDER — SODIUM CHLORIDE 0.9 % IV SOLN
INTRAVENOUS | Status: DC | PRN
  Administered 2019-05-27: 250 mL via INTRAVENOUS

## 2019-05-27 MED ORDER — BISACODYL 10 MG RE SUPP: 10.0000 mg | Freq: Every day | RECTAL | Status: DC | PRN

## 2019-05-27 MED ORDER — DOCUSATE SODIUM 50 MG/5ML PO LIQD
200.0000 mg | Freq: Two times a day (BID) | ORAL | Status: DC | PRN
  Administered 2019-06-12: 200 mg
  Filled 2019-05-27: qty 20

## 2019-05-27 MED ORDER — PRO-STAT SUGAR FREE PO LIQD: 30.0000 mL | Freq: Two times a day (BID) | ORAL | Status: DC

## 2019-05-27 MED ORDER — CHLORHEXIDINE GLUCONATE CLOTH 2 % EX PADS
6.0000 | MEDICATED_PAD | Freq: Every day | CUTANEOUS | Status: DC
  Administered 2019-05-27 – 2019-06-13 (×16): 6 via TOPICAL

## 2019-05-27 MED ORDER — FREE WATER
200.0000 mL | Status: DC
  Administered 2019-05-27 – 2019-05-30 (×17): 200 mL

## 2019-05-27 MED ORDER — INSULIN ASPART 100 UNIT/ML ~~LOC~~ SOLN
0.0000 [IU] | SUBCUTANEOUS | Status: DC
  Administered 2019-05-27 – 2019-05-30 (×12): 1 [IU] via SUBCUTANEOUS
  Administered 2019-05-31: 2 [IU] via SUBCUTANEOUS
  Administered 2019-05-31 – 2019-06-01 (×2): 1 [IU] via SUBCUTANEOUS
  Administered 2019-06-01 – 2019-06-02 (×4): 2 [IU] via SUBCUTANEOUS

## 2019-05-27 MED ORDER — PIVOT 1.5 CAL PO LIQD
1000.0000 mL | ORAL | Status: DC
  Filled 2019-05-27: qty 1000

## 2019-05-27 MED ORDER — CHLORHEXIDINE GLUCONATE 0.12% ORAL RINSE (MEDLINE KIT)
15.0000 mL | Freq: Two times a day (BID) | OROMUCOSAL | Status: DC
  Administered 2019-05-27 – 2019-06-13 (×35): 15 mL via OROMUCOSAL

## 2019-05-27 MED ORDER — MIDAZOLAM HCL 2 MG/2ML IJ SOLN: 2.0000 mg | INTRAMUSCULAR | Status: DC | PRN

## 2019-05-27 MED ORDER — CHLORHEXIDINE GLUCONATE CLOTH 2 % EX PADS: 6.0000 | MEDICATED_PAD | Freq: Every day | CUTANEOUS | Status: DC

## 2019-05-27 MED ORDER — VITAL HIGH PROTEIN PO LIQD: 1000.0000 mL | ORAL | Status: DC

## 2019-05-27 MED ORDER — IPRATROPIUM-ALBUTEROL 0.5-2.5 (3) MG/3ML IN SOLN
3.0000 mL | Freq: Four times a day (QID) | RESPIRATORY_TRACT | Status: DC
  Administered 2019-05-27 – 2019-05-30 (×14): 3 mL via RESPIRATORY_TRACT
  Filled 2019-05-27 (×12): qty 3

## 2019-05-27 MED ORDER — FENTANYL CITRATE (PF) 100 MCG/2ML IJ SOLN: 50.0000 ug | Freq: Once | INTRAMUSCULAR | Status: DC

## 2019-05-27 MED ORDER — FAMOTIDINE IN NACL 20-0.9 MG/50ML-% IV SOLN: 20.0000 mg | Freq: Two times a day (BID) | INTRAVENOUS | Status: DC

## 2019-05-27 MED ORDER — FENTANYL 2500MCG IN NS 250ML (10MCG/ML) PREMIX INFUSION
50.0000 ug/h | INTRAVENOUS | Status: DC
  Administered 2019-05-28 – 2019-05-29 (×3): 150 ug/h via INTRAVENOUS
  Administered 2019-05-29: 50 ug/h via INTRAVENOUS
  Administered 2019-05-30: 150 ug/h via INTRAVENOUS
  Administered 2019-05-31 (×2): 200 ug/h via INTRAVENOUS
  Administered 2019-06-01: 50 ug/h via INTRAVENOUS
  Filled 2019-05-27 (×8): qty 250

## 2019-05-27 MED ORDER — BUDESONIDE 0.25 MG/2ML IN SUSP
0.2500 mg | Freq: Two times a day (BID) | RESPIRATORY_TRACT | Status: DC
  Administered 2019-05-27 – 2019-05-28 (×2): 0.25 mg via RESPIRATORY_TRACT
  Filled 2019-05-27 (×2): qty 2

## 2019-05-27 MED ORDER — ACETAMINOPHEN 160 MG/5ML PO SOLN
650.0000 mg | Freq: Four times a day (QID) | ORAL | Status: DC | PRN
  Administered 2019-06-08 – 2019-06-13 (×4): 650 mg
  Filled 2019-05-27 (×4): qty 20.3

## 2019-05-27 MED ORDER — VITAMIN C 500 MG/5ML PO SYRP
500.0000 mg | ORAL_SOLUTION | Freq: Every day | ORAL | Status: DC
  Filled 2019-05-27 (×2): qty 5

## 2019-05-27 MED ORDER — NOREPINEPHRINE 4 MG/250ML-% IV SOLN
5.0000 ug/min | INTRAVENOUS | Status: DC
  Administered 2019-05-27: 18:00:00 4 ug/min via INTRAVENOUS
  Filled 2019-05-27: qty 250

## 2019-05-27 MED ORDER — PRO-STAT SUGAR FREE PO LIQD
30.0000 mL | Freq: Two times a day (BID) | ORAL | Status: DC
  Administered 2019-05-27 – 2019-05-28 (×2): 30 mL
  Filled 2019-05-27 (×2): qty 30

## 2019-05-27 MED ORDER — SODIUM CHLORIDE 0.9 % IV SOLN
250.0000 mL | INTRAVENOUS | Status: DC
  Administered 2019-06-06: 250 mL via INTRAVENOUS

## 2019-05-27 MED ORDER — VITAL HIGH PROTEIN PO LIQD
1000.0000 mL | ORAL | Status: DC
  Administered 2019-05-27: 20:00:00 1000 mL

## 2019-05-27 MED ORDER — VITAMIN C 500 MG/5ML PO SYRP
500.0000 mg | ORAL_SOLUTION | Freq: Every day | ORAL | Status: DC
  Filled 2019-05-27: qty 5

## 2019-05-27 MED ORDER — DEXAMETHASONE SODIUM PHOSPHATE 10 MG/ML IJ SOLN
6.0000 mg | Freq: Every day | INTRAMUSCULAR | Status: AC
  Administered 2019-05-27 – 2019-06-05 (×10): 6 mg via INTRAVENOUS
  Filled 2019-05-27 (×10): qty 1

## 2019-05-27 MED ORDER — FREE WATER
200.0000 mL | Status: DC
  Administered 2019-05-27: 200 mL

## 2019-05-27 NOTE — Plan of Care (Signed)
Care plan completed

## 2019-05-27 NOTE — Procedures (Signed)
Endotracheal Intubation: Acute hypoxemic respiratory failure due to severe acute COVID-19 disease Being transferred to [facility] unit Endotracheal cuff leak developed, patient will need no airway  Consent: Emergent.   Full PPE/CAPR utilized  Medications administered for sedation prior to procedure:  Patient on midazolam infusion and fentanyl infusion, mechanically ventilated   Review of lung mechanics/volumes show that despite repositioning of the ET tube a significant cuff leak exists which does not deliver adequate tidal volumes. Patient on transport stretcher.   A time out procedure was called and correct patient, name, & ID confirmed. Needed supplies and equipment were assembled and checked to include ETT, 10 ml syringe, Glidescope, tube exchanger, suction, oxygen and bag mask valve, end tidal CO2 monitor.    Heart rate, SpO2 and blood pressure was continuously monitored during the procedure. Pre-oxygenation with 100% FiO2 was conducted prior to tube exchange and reintubation.  The tube exchanger was slipped through the existing ET tube the ET tube was then withdrawn.  Glide a scope was utilized to visualize throughout the procedure.  The new 7.5 ET tube was then slid through the tube exchanger into the trachea.  Of note the patient's false vocal cords and glottis appear extremely edematous and friable.  ETT was secured at 24 cm mark.  Placement was confirmed by auscuitation of lungs with good breath sounds bilaterally and no epigastric sounds.  Condensation was noted on endotracheal tube.   Pulse ox 98%.  CO2 detector showed appropriate color change.   Complications: None .   Operator: Patsey Berthold  Patient currently being transported, lung mechanics are markedly improved post tube exchange.  She will need chest x-ray on arrival to [place] for confirmation of the ET tube.  Suspect that patient will need tracheostomy for weaning this sooner rather than later due to  severe upper airway edema.  Renold Don, MD [place] [facility]  *This note was dictated using voice recognition software/Dragon.  Despite best efforts to proofread, errors can occur which can change the meaning.  Any change was purely unintentional.

## 2019-05-27 NOTE — Plan of Care (Signed)

## 2019-05-27 NOTE — Discharge Summary (Addendum)
Physician Discharge Summary  Patient ID: Anne [name] MRN: [id number] DOB/AGE: 63-Jun-1957 63 y.o.  Admit date: 05/23/2019 Discharge date: 06/02/2019  Admission Diagnoses:  Discharge Diagnoses:  Active Problems:   Pneumonia due to COVID-19 virus   Acute respiratory failure due to COVID-19 (HCC) Bradycardia Acute kidney injury Electrolyte derangements Anemia of critical illness Hyperglycemia in the setting of diabetes mellitus type 2 Morbid obesity   Discharged Condition: Critical but stable condition  Hospital Course: Anne [name] is a 64 y.o. Female with a past medical history notable for hypertension, CHF, COPD, Hepatitis C s/p treatment, CAD and bradycardia who presented to [facility] ED on 05/23/19 due to progressive shortness of breath. She tested positive for COVID-19 5 days ago (her brother recently died from [place], patient has had for family members die of COVID-19 since 25 December).  She reports she initially had some shortness of breath that improved, but over the last 3 days her shortness of breath has progressed to being present with minimal exertion. She also reports associated loss of smell , fever, chills, body aches, and diarrhea.  She denies any bloody or black tarry stools, chest pain, dizziness, abdominal pain, dysuria, urinary frequency, or constipation.  Upon presentation to the ED she was noted to be hypoxic, tachypneic, and hypotensive.   Her vitals signs included: Temp 99.7, BP 93/52, HR 78, RR 25, and SpO2 94% on 4L [place]. She was subquently  placed on 4L Nasal cannula and given 3L IV Fluid boluses.  Initial workup in the ED revealed Creatinine 5.34, BUN 45, bicarb 12, anion gap 14, sodium 133, high sensitivity troponin 27, ferritin 817, LDH 256, CRP 2.2, fibrinogen 711, d-dimer 989, lactic acid 1.2, procalcitonin 0.14, WBC 9, Hgb 11.5.  Her POC COVID-19 is POSITIVE.  CXR is concerning for multifocal pneumonia.  CT renal stone study with left nephrolithiasis without obstructive  uropathy or hydronephrosis.  She was admitted to [place] unit by hospitalist.   While on the med-surg unit she was again noted to be hypotensive with no urine output since admission, along with increasing FiO2 requirements from 4L to 6L.  A foley was placed, and repeat BMP was obtained which revealed severe metabolic acidosis.  She was given 3 amps of Bicarb and placed on Bicarb drip.  Given her hypotension, increasing FiO2 requirements, and severe metabolic acidosis, she was transferred to [facility] unit for closer monitoring.  Upon transfer to [place], pt hypotensive with tachypnea (RR 30's).  PCCM is consulted to assist with management of Acute Hypoxic Respiratory Failure in the setting of COVID-19 Pneumonia and severe metabolic acidosis, hypotension, and AKI requiring Bicarb drip.  She continued to deteriorate and required intubation on 2/28 2020.  Despite mechanical ventilation she continued to have high FiO2 requirements and had poor oxygenation.  She was proned on 05/25/2018 for a total of 16 hours.  She had continued protocols with remdesivir and dexamethasone, because of her desperately ill condition noted on 3/1 ivermectin was also added to her protocol.  She resumed supine position 05/26/2018.  She has now been stabilized with recruitment maneuvers and O2 requirements are decreasing.  Will likely however continue to require prolonged ventilatory support.  After discussion of the family was decided to transfer the patient to [facility] unit (Covid ICU).  She has been stabilized for transfer.  The see progress note from today for more details.  Consults: pulmonary/intensive care  Significant Diagnostic Studies: See progress notes and results in chart.  Treatments: Remdesivir, Decadron, appropriate multivitamins,  ivermectin, bronchodilators, vasopressors, and sedatives as needed for ventilator comfort.  Discharge Exam: BP (!) 85/48 (BP Location: Left Arm)   Pulse 76   Temp 98.2 F  (36.8 C) (Rectal)   Resp 10   Ht 5' 2.99" (1.6 m)   Wt 101.9 kg   SpO2 91%   BMI 39.80 kg/m   General: Critically ill appearing female, obese, intubated, mechanically ventilated. Neuro: Heavily sedated, RASS -2 moving all 4 spontaneously, appropriate grasp HEENT:  Atraumatic, normocephalic, orotracheally intubated, OG in place Neck: Supple, trachea midline, no crepitus Cardiovascular: Monitor: Sinus bradycardia. Pulses intact distally +2 Lungs:  Unable to auscultate well due to CAPR, no ventilator asynchrony Abdomen:  Obese, soft,nondistended Musculoskeletal:  Normal bulk and tone, no deformities, no edema Skin:  Warm and dry.  No obvious rashes, lesions, or ulcerations  Disposition:  Transfer to [facility] unit (Covid unit).  Anne [name], notified.  Phone number is [phone] he is primary point of contact.  Medications, see under medication tab.   Current Facility-Administered Medications:  .  0.9 %  sodium chloride infusion, , Intravenous, PRN, Tyler Pita, MD, Stopped at 06/02/2019 1146 .  acetaminophen (TYLENOL) tablet 650 mg, 650 mg, Oral, Q6H PRN **OR** acetaminophen (TYLENOL) suppository 650 mg, 650 mg, Rectal, Q6H PRN, Sharion Settler, NP .  ascorbic acid (VITAMIN C) tablet 500 mg, 500 mg, Oral, Daily, Sharion Settler, NP, 500 mg at 06/18/2019 0912 .  bisacodyl (DULCOLAX) EC tablet 5 mg, 5 mg, Oral, Daily PRN, Sharion Settler, NP .  budesonide (PULMICORT) nebulizer solution 0.25 mg, 0.25 mg, Nebulization, BID, Tyler Pita, MD, 0.25 mg at 06/23/2019 0720 .  ceFEPIme (MAXIPIME) 2 g in sodium chloride 0.9 % 100 mL IVPB, 2 g, Intravenous, Q12H, Tyler Pita, MD, Stopped at 06/05/2019 1036 .  chlorhexidine gluconate (MEDLINE KIT) (PERIDEX) 0.12 % solution 15 mL, 15 mL, Mouth Rinse, BID, Blakeney, Dana G, NP, 15 mL at 06/15/2019 0732 .  Chlorhexidine Gluconate Cloth 2 % PADS 6 each, 6 each, Topical, Daily, Wyvonnia Dusky, MD, 6 each at 05/26/19  1144 .  Chlorhexidine Gluconate Cloth 2 % PADS 6 each, 6 each, Topical, Q0600, Tyler Pita, MD, Stopped at 05/26/19 0700 .  dexamethasone (DECADRON) injection 6 mg, 6 mg, Intravenous, Q24H, Sharion Settler, NP, 6 mg at 05/26/19 1447 .  dextrose 5 %-0.45 % sodium chloride infusion, , Intravenous, Continuous, Tyler Pita, MD, Stopped at 05/26/2019 1149 .  docusate sodium (COLACE) capsule 200 mg, 200 mg, Oral, BID PRN, Sharion Settler, NP .  doxycycline (VIBRAMYCIN) 100 mg in sodium chloride 0.9 % 250 mL IVPB, 100 mg, Intravenous, Q12H, Tyler Pita, MD, Stopped at 06/23/2019 [phone] .  famotidine (PEPCID) IVPB 20 mg premix, 20 mg, Intravenous, Q12H, Darel Hong D, NP, Last Rate: 100 mL/hr at 06/08/2019 1200, Rate Verify at 06/16/2019 1200 .  feeding supplement (PIVOT 1.5 CAL) liquid 1,000 mL, 1,000 mL, Per Tube, Continuous, Tyler Pita, MD, Stopped at 06/03/2019 1301 .  fentaNYL (SUBLIMAZE) bolus via infusion 50 mcg, 50 mcg, Intravenous, Q15 min PRN, Darel Hong D, NP .  fentaNYL 2585mg in NS 2577m(1072mml) infusion-PREMIX, 50-200 mcg/hr, Intravenous, Continuous, KeeDarel Hong NP, Last Rate: 15 mL/hr at 06/17/2019 1200, 150 mcg/hr at 05/26/2019 1200 .  free water 200 mL, 200 mL, Per Tube, Q4H, GonVernard Gambles MD, 200 mL at 06/06/2019 1150 .  heparin ADULT infusion 100 units/mL (25000 units/250m51mdium chloride 0.45%), 450 Units/hr, Intravenous, Continuous, GrubDallie Piles  RPH, Last Rate: 4.5 mL/hr at 06/12/2019 1200, 450 Units/hr at 06/22/2019 1200 .  insulin aspart (novoLOG) injection 0-15 Units, 0-15 Units, Subcutaneous, Q4H, Tyler Pita, MD, 2 Units at 06/13/2019 1150 .  ipratropium-albuterol (DUONEB) 0.5-2.5 (3) MG/3ML nebulizer solution 3 mL, 3 mL, Nebulization, Q6H, Tyler Pita, MD, 3 mL at 06/06/2019 0720 .  MEDLINE mouth rinse, 15 mL, Mouth Rinse, 10 times per day, Awilda Bill, NP, 15 mL at 06/02/2019 1152 .  Melatonin TABS 10 mg, 10 mg, Oral, QHS,  Tyler Pita, MD .  midazolam (VERSED) 50 mg/50 mL (1 mg/mL) premix infusion, 0.5 mg/hr, Intravenous, Continuous, Tyler Pita, MD, Last Rate: 4 mL/hr at 06/17/2019 1200, 4 mg/hr at 06/06/2019 1200 .  mupirocin ointment (BACTROBAN) 2 % 1 application, 1 application, Nasal, BID, Tyler Pita, MD, 1 application at 01/13/10 [phone]  norepinephrine (LEVOPHED) 16 mg in 221m premix infusion, 0-40 mcg/min, Intravenous, Titrated, KDarel HongD, NP, Last Rate: 1.88 mL/hr at 05/25/2019 1200, 2 mcg/min at 06/08/2019 1200 .  ondansetron (ZOFRAN) injection 4 mg, 4 mg, Intravenous, Q8H PRN, MSharion Settler NP .  ondansetron (ZOFRAN-ODT) disintegrating tablet 4 mg, 4 mg, Oral, Q8H PRN, MSharion Settler NP .  potassium & sodium phosphates (PHOS-NAK) 280-160-250 MG packet 2 packet, 2 packet, Per Tube, TID AC & HS, GTyler Pita MD, 2 packet at 06/05/2019 1257 .  [COMPLETED] remdesivir 200 mg in sodium chloride 0.9% 250 mL IVPB, 200 mg, Intravenous, Once, Stopped at 05/24/19 1343 **FOLLOWED BY** remdesivir 100 mg in sodium chloride 0.9 % 100 mL IVPB, 100 mg, Intravenous, Daily, WWyvonnia Dusky MD, Stopped at 06/23/2019 [phone].  sodium chloride flush (NS) 0.9 % injection 10-40 mL, 10-40 mL, Intracatheter, Q12H, Blakeney, DDreama Saa NP, 10 mL at 05/26/19 2255 .  sodium chloride flush (NS) 0.9 % injection 10-40 mL, 10-40 mL, Intracatheter, PRN, BAwilda Bill NP .  sodium chloride flush (NS) 0.9 % injection 3 mL, 3 mL, Intravenous, Q12H, MSharion Settler NP, 3 mL at 05/26/19 2255 .  traMADol (ULTRAM) tablet 50 mg, 50 mg, Oral, Q6H PRN, MSharion Settler NP .  vasopressin (PITRESSIN) 40 Units in sodium chloride 0.9 % 250 mL (0.16 Units/mL) infusion, 0.03 Units/min, Intravenous, Continuous, KBradly Bienenstock NP, Stopped at 06/13/2019 1146 .  zinc sulfate capsule 220 mg, 220 mg, Oral, Daily, MSharion Settler NP, 220 mg at 06/11/2019 01030   Signed: CRenold Don MD  PCCM 06/10/2019, 12:23  PM  *This note was dictated using voice recognition software/Dragon.  Despite best efforts to proofread, errors can occur which can change the meaning.  Any change was purely unintentional.

## 2019-05-27 NOTE — Progress Notes (Addendum)
Shift summary:  - Remains intubated, sedated. - Weaning FiO2 as tolerated. - Pending transfer to [facility]. - 1230 hrs: Report given to Shanon Brow, RN on [place].  - 1345 hrs: [facility] at bedside for transport.

## 2019-05-27 NOTE — Progress Notes (Signed)
ANTICOAGULATION CONSULT NOTE  Pharmacy Consult for heparin Indication: Possible pulmonary embolus  Patient Measurements: Height: 5' 2.99" (160 cm) Weight: 224 lb 10.4 oz (101.9 kg) IBW/kg (Calculated) : 52.38 Heparin Dosing Weight: 72 kg  Vital Signs: Temp: 98.6 F (37 C) (03/03 0500) Temp Source: Rectal (03/02 2000) BP: 78/53 (03/03 0500) Pulse Rate: 66 (03/03 0500)  Labs: Recent Labs    05/24/19 2204 05/25/19 0430 05/25/19 0432 05/25/19 1229 05/25/19 2256 05/26/19 0431 05/26/19 0810 05/26/19 2200 05/25/2019 0449  HGB   < >  --  11.4*  --   --  11.1*  --   --  9.4*  HCT   < >  --  33.8*  --   --  34.3*  --   --  29.8*  PLT   < >  --  371  --   --  348  --   --  303  APTT  --  39*  --   --   --   --   --   --   --   LABPROT  --  13.9  --   --   --   --   --   --   --   INR  --  1.1  --   --   --   --   --   --   --   HEPARINUNFRC  --   --   --    < >   < >  --  1.19* 0.34 0.35  CREATININE   < >  --  1.90*  --   --  1.69*  --   --  1.46*   < > = values in this interval not displayed.    Estimated Creatinine Clearance: 45.5 mL/min (A) (by C-G formula based on SCr of 1.46 mg/dL (H)).   Medical History: Past Medical History:  Diagnosis Date  . Benign essential HTN 11/16/2014  . Breathlessness on exertion 09/03/2013  . CHF (congestive heart failure) ([place])   . COPD (chronic obstructive pulmonary disease) ([place])   . Exposure to hepatitis B 12/10/2014  . Heart valve disease 09/03/2013  . Hepatitis C    took Harvoni. now clear.  Marland Kitchen History of cardiac catheterization 03/24/2014   Overview:  With normal coronaries 2015     Medications:  Scheduled:  . vitamin C  500 mg Oral Daily  . budesonide (PULMICORT) nebulizer solution  0.25 mg Nebulization BID  . chlorhexidine gluconate (MEDLINE KIT)  15 mL Mouth Rinse BID  . Chlorhexidine Gluconate Cloth  6 each Topical Daily  . Chlorhexidine Gluconate Cloth  6 each Topical Q0600  . dexamethasone (DECADRON) injection  6 mg  Intravenous Q24H  . feeding supplement (PRO-STAT SUGAR FREE 64)  60 mL Per Tube TID  . feeding supplement (VITAL HIGH PROTEIN)  1,000 mL Per Tube Q24H  . insulin aspart  1-3 Units Subcutaneous Q4H  . ipratropium-albuterol  3 mL Nebulization Q6H  . ivermectin  200 mcg/kg Oral QODAY  . mouth rinse  15 mL Mouth Rinse 10 times per day  . Melatonin  10 mg Oral QHS  . mupirocin ointment  1 application Nasal BID  . sodium chloride flush  10-40 mL Intracatheter Q12H  . sodium chloride flush  3 mL Intravenous Q12H  . zinc sulfate  220 mg Oral Daily    Assessment: Patient admitted x SOB found to have COVID + pneumonia was suspected to have PE but CT PE not performed  d/t AKI on CKD. Patient is being placed on heparin drip for treatment of possible PE d/t elevated d-dimer.  H&H lower than baseline but stable, platelets stable  Heparin Course: 03/01 initiation: no bolus, started at 1200 units/hr 03/01 1229 HL 1.20: reduced to 900 units/hr 03/01 2256 HL 1.48: reduced to 700 units/hr 03/02 0810 HL 1.19: reduced to 450 units/hr 03/02 2200 HL 0.34: therapeutic x 1.  Continue current rate and recheck HL in am to confirm 03/03 0449 HL 0.35: therapeutic x 2.  CBC worse.  Continue current rate and recheck HL, CBC in am  Goal of Therapy:  Heparin level 0.3-0.7 units/ml Monitor platelets by anticoagulation protocol: Yes   Plan:   Continue Heparin at current rate  Re-check anti-Xa in am   monitor daily CBC and for s/sx bleeding complications   Hart Robinsons, PharmD Clinical Pharmacist 06/10/2019,6:14 AM

## 2019-05-27 NOTE — Progress Notes (Signed)
Name: Anne [name] MRN: [id number] DOB: 1955/04/02    ADMISSION DATE:  05/23/2019 ORIGINAL CONSULTATION DATE:  05/23/2019  FOLLOW-UP: Respiratory failure, hypoxic, due to COVID-19; ventilator dependence.  BRIEF PATIENT DESCRIPTION:  64 y.o. Female admitted 2/27 with acute hypoxic/hypercarbic respiratory failure secondary to [id number] pneumonia complicated by severe metabolic acidosis, hypotension, along with severe AKI requiring bicarb gtt. failed noninvasive ventilation, intubated now mechanically ventilated.  SIGNIFICANT EVENTS  2/27- Admission to [place] unit 2/27- Transfer to Stepdown due to hypotension, increasing FiO2 requirements, metabolic acidosis requiring Bicarb gtt 2/28- intubated, initiated mechanical ventilation. Central venous access right femoral placed 3/01- continued issues with oxygenation despite mechanical ventilation, placed in prone position 3/02- oxygenation remains tenuous, resumed supine position, recruitment maneuvers initiated, patient tolerating, initiating potential transfer to [facility] unit 50M, spoke with Dr. Kara Mead 3/03- 50M unit as bed available for patient will transfer.  Oxygen requirements are decreasing.  STUDIES:  2/27- CXR>>Multifocal pneumonia   2/27- CT Renal Stone Study>> 1. Left nephrolithiasis, without obstructive uropathy. 2. Bibasilar peripheral ground-glass opacities, consistent with COVID-19 pneumonia. More focal nodular opacity in the left lower lobe could represent an area of consolidation or a true pulmonary nodule. Recommend follow-up chest CT at 3 months, after the patient is recovered from the acute illness. 3. Suspicion of cirrhosis, especially given the history of hepatitis B and C. 4.  Aortic Atherosclerosis  CULTURES: POC SARS-CoV-2 2/27>> POSITIVE Blood culture x2 2/27>> negative Strep pneumo urinary antigen 2/27>> negative Legionella urinary antigen 2/27>> negative  ANTIBIOTICS: Cefepime >>3/2   Doxycycline >> 3/2   Allergies  Allergen Reactions  . Ibuprofen Other (See Comments)    Reaction:  Unknown   . Nitrofuran Derivatives Other (See Comments)    Reaction:  Unknown   . Ribavirin Nausea And Vomiting    REVIEW OF SYSTEMS: Unable to perform due to critical illness, mechanically ventilated status.  SUBJECTIVE:  Intubated, mechanically ventilated. Was switched from prone position to supine position yesterday. Not following commands readily. Does grasp appropriately.  VITAL SIGNS: Temp:  [95.7 F (35.4 C)-98.6 F (37 C)] 98.4 F (36.9 C) (03/03 0800) Pulse Rate:  [33-76] 76 (03/03 0800) Resp:  [16-34] 25 (03/03 0800) BP: (78-120)/(34-86) 104/64 (03/03 0800) SpO2:  [76 %-100 %] 95 % (03/03 0800) FiO2 (%):  [80 %-90 %] 80 % (03/03 0800) Weight:  [101.9 kg] 101.9 kg (03/03 0441)  VENT SETTINGS: Vent Mode: PRVC FiO2 (%):  [70 %-90 %] 70 % Set Rate:  [34 bmp] 34 bmp Vt Set:  [400 mL] 400 mL PEEP:  [5 cmH20-16 cmH20] 16 cmH20    PHYSICAL EXAMINATION: General: Critically ill appearing female, obese, intubated, mechanically ventilated. Neuro: Heavily sedated, RASS -2 moving all 4 spontaneously, appropriate grasp HEENT:  Atraumatic, normocephalic, orotracheally intubated, OG in place Neck: Supple, trachea midline, no crepitus Cardiovascular: Monitor: Sinus bradycardia. Pulses intact distally +2 Lungs:  Unable to auscultate well due to CAPR, no ventilator asynchrony Abdomen:  Obese, soft,nondistended Musculoskeletal:  Normal bulk and tone, no deformities, no edema Skin:  Warm and dry.  No obvious rashes, lesions, or ulcerations  Recent Labs  Lab 05/25/19 0432 05/26/19 0431 06/03/2019 0449  NA 144 146* 149*  K 4.5 4.8 5.0  CL 106 110 115*  CO2 27 24 24   BUN 25* 30* 31*  CREATININE 1.90* 1.69* 1.46*  GLUCOSE 229* 196* 158*   Recent Labs  Lab 05/25/19 0432 05/26/19 0431 06/10/2019 0449  HGB 11.4* 11.1* 9.4*  HCT 33.8* 34.3* 29.8*  WBC 27.1* 25.0* 16.7*   PLT 371 348 303   DG Chest Port 1 View  Result Date: 05/26/2019 CLINICAL DATA:  Respiratory failure.  COVID positive. EXAM: PORTABLE CHEST 1 VIEW COMPARISON:  Chest x-ray from yesterday. FINDINGS: Unchanged endotracheal and enteric tubes. Stable cardiomediastinal silhouette. Bilateral interstitial and hazy airspace opacities appear mildly worsened in the right lung and not significantly changed in the left lung. No pleural effusion or pneumothorax. No acute osseous abnormality. IMPRESSION: Multifocal pneumonia, worsened in the right lung when compared to yesterday. Electronically Signed   By: Titus Dubin M.D.   On: 05/26/2019 12:39   ECHOCARDIOGRAM COMPLETE  Result Date: 05/26/2019    ECHOCARDIOGRAM REPORT   Patient Name:   Anne [name] Date of Exam: 05/25/2019 Medical Rec #:  [id number]       Height:       63.0 in Accession #:    [id number]      Weight:       221.8 lb Date of Birth:  October 03, 1955       BSA:          2.020 m Patient Age:    36 years        BP:           96/52 mmHg Patient Gender: F               HR:           55 bpm. Exam Location:  [facility] Procedure: 2D Echo, Cardiac Doppler and Color Doppler Indications:     CHF 428.0  History:         Patient has no prior history of Echocardiogram examinations.                  CHF; COPD.  Sonographer:     Sherrie Sport RDCS (AE) Referring Phys:  [id number] Bradly Bienenstock Diagnosing Phys: Yolonda Kida MD  Sonographer Comments: Echo performed with patient supine and on artificial respirator. IMPRESSIONS  1. Left ventricular ejection fraction, by estimation, is 70 to 75%. Left ventricular ejection fraction by PLAX is 81 %. The left ventricle has hyperdynamic function. The left ventricle has no regional wall motion abnormalities. There is mild concentric left ventricular hypertrophy. Left ventricular diastolic parameters were normal.  2. Right ventricular systolic function is normal. The right ventricular size is normal. There is normal pulmonary artery  systolic pressure.  3. The mitral valve is grossly normal. No evidence of mitral valve regurgitation.  4. The aortic valve is grossly normal. Aortic valve regurgitation is not visualized. FINDINGS  Left Ventricle: Left ventricular ejection fraction, by estimation, is 70 to 75%. Left ventricular ejection fraction by PLAX is 81 % The left ventricle has hyperdynamic function. The left ventricle has no regional wall motion abnormalities. The left ventricular internal cavity size was normal in size. There is mild concentric left ventricular hypertrophy. Left ventricular diastolic parameters were normal. Right Ventricle: The right ventricular size is normal. No increase in right ventricular wall thickness. Right ventricular systolic function is normal. There is normal pulmonary artery systolic pressure. The tricuspid regurgitant velocity is 1.52 m/s, and  with an assumed right atrial pressure of 10 mmHg, the estimated right ventricular systolic pressure is [id number] mmHg. Left Atrium: Left atrial size was normal in size. Right Atrium: Right atrial size was normal in size. Pericardium: There is no evidence of pericardial effusion. Mitral Valve: The mitral valve is grossly normal. No evidence of mitral valve regurgitation. Tricuspid Valve:  The tricuspid valve is grossly normal. Tricuspid valve regurgitation is trivial. Aortic Valve: The aortic valve is grossly normal. Aortic valve regurgitation is not visualized. Aortic valve mean gradient measures 3.5 mmHg. Aortic valve peak gradient measures 7.3 mmHg. Aortic valve area, by VTI measures 2.43 cm. Pulmonic Valve: The pulmonic valve was normal in structure. Pulmonic valve regurgitation is not visualized. Aorta: The aortic root is normal in size and structure. IAS/Shunts: No atrial level shunt detected by color flow Doppler.  LEFT VENTRICLE PLAX 2D LV EF:         Left            Diastology                ventricular     LV e' lateral:   8.49 cm/s                ejection        LV  E/e' lateral: 8.3                fraction by     LV e' medial:    5.77 cm/s                PLAX is 81 %    LV E/e' medial:  12.2 LVIDd:         5.21 cm LVIDs:         2.59 cm LV PW:         1.05 cm LV IVS:        1.03 cm LVOT diam:     2.00 cm LV SV:         59 LV SV Index:   29 LVOT Area:     3.14 cm  RIGHT VENTRICLE RV S prime:     12.50 cm/s TAPSE (M-mode): 2.6 cm LEFT ATRIUM           Index       RIGHT ATRIUM           Index LA diam:      3.50 cm 1.73 cm/m  RA Area:     14.20 cm LA Vol (A4C): 31.5 ml 15.59 ml/m RA Volume:   34.00 ml  16.83 ml/m  AORTIC VALVE                   PULMONIC VALVE AV Area (Vmax):    1.82 cm    RVOT Peak grad: 3 mmHg AV Area (Vmean):   1.85 cm AV Area (VTI):     2.43 cm AV Vmax:           135.00 cm/s AV Vmean:          88.550 cm/s AV VTI:            0.244 m AV Peak Grad:      7.3 mmHg AV Mean Grad:      3.5 mmHg LVOT Vmax:         78.30 cm/s LVOT Vmean:        52.200 cm/s LVOT VTI:          0.188 m LVOT/AV VTI ratio: 0.77  AORTA Ao Root diam: 2.50 cm MITRAL VALVE               TRICUSPID VALVE MV Area (PHT): 2.44 cm    TR Peak grad:   9.2 mmHg MV Decel Time: 311 msec    TR Vmax:        152.00 cm/s MV  E velocity: 70.30 cm/s MV A velocity: 56.80 cm/s  SHUNTS MV E/A ratio:  1.24        Systemic VTI:  0.19 m                            Systemic Diam: 2.00 cm Dwayne D Callwood MD Electronically signed by Yolonda Kida MD Signature Date/Time: 05/26/2019/8:21:02 AM    Final     ASSESSMENT / PLAN:  Acute hypoxic/hypercarbic respiratory failure secondary to COVID-19 pneumonia  Improving hypoxemia, weaning FiO2/PEEP as tolerated Metabolic acidosis- resolved Hx: COPD Continue mechanical ventilation support Placed back on supine positioning after 16 hours of prone positioning on 3/2 Recruitment maneuvers performed and tolerated, every 4 and as needed discussed with RT Continue recruitment maneuvers as tolerates Continue bronchodilators Continue budesonide nebulized Continue  Decadron Continue remdesivir (last day will be 3/5) Received ivermectin doses 3/1, 3/3 per [facility] protocol Ivermectin dosing 3/5 and 3/7 to complete protocol recommended Transfer to ICU 26M [place] Dr. Ander Slade, accepting per discussion with Dr. Lake Bells  COVID-19 Pneumonia Query superimposed bacterial pneumonia Monitor fever curve Leukocytosis on downward trend since initiating antibiotics yesterday WBC 27.1>>25.0>>16.7 Continue empiric cefepime/doxycycline (avoiding QT prolonging antibiotics) Follow cultures as above Continue remdesivir (last dose 3/5) Continue Decadron Continue to trend inflammatory markers CRP 9.4>>20.1>>11.5 Elevated D-dimer: Rechecking  AKI on CKD Metabolic Acidosis: Resolved Electrolyte derangements Monitor I&O's / urinary output Follow renal panel Maintain renal perfusion with attention to hemodynamics Avoid nephrotoxic agents as able Replace electrolytes as indicated, magnesium level pending Continue IV Fluids, would switch to D51/2NS BUN 31 creatinine 1.46  (baseline creatinine 1.3) Replete phosphorus Add free water via tube for hypernatremia   Shock physiology resolved Mildly elevated Troponin, likely demand ischemia Bradycardia, resolved avoiding negative chronotropic agents Hx: HTN, CHF, HLD Continuous cardiac monitoring Maintain MAP >65 Volume resuscitated Vasopressors as needed to maintain MAP goal (currently on low-dose norepi) Continue to hold home antihypertensives 2D Echocardiogram 3/1: LVEF 70 to 75%.  Hyperdynamic left ventricular function, left concentric ventricular hypertrophy, diastology normal normal right ventricular function.  Normal valve function Avoid negative chronotropic agents due to bradycardia Bradycardia has responded to discontinuing Propofol Review of [facility] records shows that she has had bradycardia since 2017, etiology unknown TSH/free T4 consistent with "sick euthyroid"  Switched sedation from  propofol to Versed gtt due to bradycardia  Anemia of critical illness H&H 9.4 and 29.8 No evidence of active bleeding Transfuse for Hgb less than 7 Check D-dimer If D-dimer on downward trend can switch to subcu DVT prophylaxis and discontinue heparin infusion  Hyperglycemia CBG's ICU SSI protocol  Nutrition Tube feed protocol Constipation protocol   DISPOSITION: ICU GOALS OF CARE: Full code VTE PROPHYLAXIS: Has been on heparin infusion, transition to subcu UPDATES: Updated son and daughter via phone and advised of potential transfer to [facility].  Daughter Areale, will be primary contact.  Phone number updated [phone].   Multidisciplinary rounds were performed with the ICU team.  Care coordination with Kerry Hough, RN bedside nurse.  Plan for transfer to [facility] unit.  Critical care time 45 minutes.    Renold Don, MD [facility] 06/13/2019, 8:59 AM    *This note was dictated using voice recognition software/Dragon.  Despite best efforts to proofread, errors can occur which can change the meaning.  Any change was purely unintentional.

## 2019-05-27 NOTE — Progress Notes (Signed)
Nutrition Follow-up  RD working remotely.  DOCUMENTATION CODES:   Obesity unspecified  INTERVENTION:  Initiate Pivot 1.5 Cal at 25 mL/hr and advance by 10 mL/hr every 8 hours to goal rate of 55 mL/hr (1320 mL goal daily volume). Provides 1980 kcal, 124 grams of protein, 990 mL H2O daily.  Provide minimum free water flush of 30 mL Q4hrs to maintain tube patency.  Monitor magnesium, potassium, and phosphorus daily for at least 3 days, MD to replete as needed, as pt is at risk for refeeding syndrome.  NUTRITION DIAGNOSIS:   Increased nutrient needs related to catabolic MRAJHHI(DUPBD-57) as evidenced by estimated needs.  Ongoing - addressing with TF regimen.  GOAL:   Provide needs based on ASPEN/SCCM guidelines  Met with TF regimen.  MONITOR:   Vent status, Labs, Weight trends, TF tolerance, I & O's  REASON FOR ASSESSMENT:   Ventilator    ASSESSMENT:   64 year old female with PMHx of CHF, COPD, hepatitis C s/p Harvoni treatment admitted with acute hypoxic respiratory failure secondary to COVID-19 PNA requiring intubation on 2/28, also with metabolic acidosis, hypotension, AKI.  -Patient was placed in prone position on 3/1. Resumed supine position on 3/2. -Tube feeds were initiated on 3/2.  Patient is currently intubated on ventilator support MV: 11.5 L/min Temp (24hrs), Avg:97.7 F (36.5 C), Min:96.6 F (35.9 C), Max:98.6 F (37 C)  Propofol: N/A  Medications reviewed and include: vitamin C 500 mg daily, Decadron 6 mg Q24hrs IV, Novolog 1-3 units Q4hrs, melatonin 10 mg QHS, zinc sulfate 220 mg daily, cefepime, doxycycline, famotidine, fentanyl gtt, heparin gtt, Versed gtt, norepinephrine gtt at 1 mcg/min, remdesivir, vasopressin gtt at 0.03 units/min.  Labs reviewed: CBG 141-154, Sodium 149, Chloride 115, BUN 31, Creatinine 1.46, Phosphorus 1.9.  Enteral Access: OGT  TF regimen: Vital High Protein at 20 mL/hr + Pro-Stat 60 mL TID  I/O: 2010 mL UOP yesterday (0.8  mL/kg/hr)  Weight trend: 101.9 kg on 3/3; +1 kg from 2/27  Discussed with RN and on rounds. Patient now off propofol gtt due to bradycardia. Plan is to transfer to [facility].  Diet Order:   Diet Order            Diet NPO time specified  Diet effective now             EDUCATION NEEDS:   No education needs have been identified at this time  Skin:  Skin Assessment: Reviewed RN Assessment  Last BM:  05/24/2019 large type 7  Height:   Ht Readings from Last 1 Encounters:  05/26/2019 5' 2.99" (1.6 m)   Weight:   Wt Readings from Last 1 Encounters:  06/24/2019 101.9 kg   Ideal Body Weight:  52.3 kg  BMI:  Body mass index is 39.8 kg/m.  Estimated Nutritional Needs:   Kcal:  1760-2110  Protein:  120-130 grams  Fluid:  1.5-1.8 L/day  Jacklynn Barnacle, MS, RD, LDN Pager number available on Amion

## 2019-05-27 NOTE — Progress Notes (Signed)
Pt SB at 47 bpm. Versed turned off. Will continue to monitor

## 2019-05-27 NOTE — Progress Notes (Signed)
Therapeutic Maneuvers performed. Tolerated well. Will continue to monitor.

## 2019-05-27 NOTE — H&P (Signed)
Name: Anne [name] MRN: [id number] DOB: [birth year]    ADMISSION DATE:  06/11/2019 to [place]:  05/23/2019 at [facility]  Diagnosis: Respiratory failure, hypoxic, due to COVID-19; ventilator dependence.  BRIEF PATIENT DESCRIPTION:  64 y.o. Female admitted 2/27 with acute hypoxic/hypercarbic respiratory failure secondary to [id number] pneumonia complicated by severe metabolic acidosis, hypotension, along with severe AKI requiring bicarb gtt. failed noninvasive ventilation, intubated now mechanically ventilated.  SIGNIFICANT EVENTS  2/27- Admission to [facility] unit 2/27- Transfer to Stepdown due to hypotension, increasing FiO2 requirements, metabolic acidosis requiring Bicarb gtt 2/28- intubated, initiated mechanical ventilation. Central venous access right femoral placed 3/01- continued issues with oxygenation despite mechanical ventilation, placed in prone position 3/02- oxygenation remains tenuous, resumed supine position, recruitment maneuvers initiated, patient tolerating, initiating potential transfer to [facility] unit 22M, spoke with Dr. Kara Mead 3/03-[place] 22M unit as bed available for patient will transfer.  Oxygen requirements are decreasing. 3/03: Admitted to   STUDIES:  2/27- CXR>>Multifocal pneumonia   2/27- CT Renal Stone Study>> 1. Left nephrolithiasis, without obstructive uropathy. 2. Bibasilar peripheral ground-glass opacities, consistent with COVID-19 pneumonia. More focal nodular opacity in the left lower lobe could represent an area of consolidation or a true pulmonary nodule. Recommend follow-up chest CT at 3 months, after the patient is recovered from the acute illness. 3. Suspicion of cirrhosis, especially given the history of hepatitis B and C. 4.  Aortic Atherosclerosis  CULTURES: POC SARS-CoV-2 2/27>> POSITIVE Blood culture x2 2/27>> negative Strep pneumo urinary antigen 2/27>> negative Legionella urinary  antigen 2/27>> negative  ANTIBIOTICS: Cefepime >>3/2  Doxycycline >> 3/2   Allergies  Allergen Reactions  . Ibuprofen Other (See Comments)    Reaction:  Unknown   . Nitrofuran Derivatives Other (See Comments)    Reaction:  Unknown   . Ribavirin Nausea And Vomiting    REVIEW OF SYSTEMS: Unable to perform due to critical illness, mechanically ventilated status.  SUBJECTIVE:   Tolerated transfer well Does not appear to be in any distress  VITAL SIGNS: Temp:  [97.2 F (36.2 C)-98.6 F (37 C)] 98.4 F (36.9 C) (03/03 1600) Pulse Rate:  [53-81] 59 (03/03 1600) Resp:  [10-34] 34 (03/03 1600) BP: (78-117)/(48-71) 117/68 (03/03 1600) SpO2:  [90 %-100 %] 98 % (03/03 1600) FiO2 (%):  [50 %-100 %] 60 % (03/03 1558) Weight:  [101.9 kg-105.1 kg] 105.1 kg (03/03 1515)  VENT SETTINGS: Vent Mode: PRVC FiO2 (%):  [50 %-100 %] 60 % Set Rate:  [34 bmp] 34 bmp Vt Set:  [310 mL-400 mL] 310 mL PEEP:  [5 cmH20-16 cmH20] 16 cmH20 Plateau Pressure:  [31 cmH20] 31 cmH20    PHYSICAL EXAMINATION: Middle-aged lady, does not appear to be in distress Moist oral mucosa, endotracheal tube in place Neck is supple Chest equal movement percussion note is resonant, no rales S1-S2 appreciated Abdomen is soft, bowel sounds appreciated Extremities shows no clubbing, no edema Neurologically-sedated  Recent Labs  Lab 05/25/19 0432 05/26/19 0431 06/08/2019 0449  NA 144 146* 149*  K 4.5 4.8 5.0  CL 106 110 115*  CO2 27 24 24   BUN 25* 30* 31*  CREATININE 1.90* 1.69* 1.46*  GLUCOSE 229* 196* 158*   Recent Labs  Lab 05/25/19 0432 05/26/19 0431 06/04/2019 0449  HGB 11.4* 11.1* 9.4*  HCT 33.8* 34.3* 29.8*  WBC 27.1* 25.0* 16.7*  PLT 371 348 303   DG Chest Port 1 View  Result Date: 06/11/2019 CLINICAL DATA:  Respiratory failure,  COVID pneumonia EXAM: PORTABLE CHEST 1 VIEW COMPARISON:  05/26/2019 FINDINGS: Support devices are stable. Cardiomegaly. Patchy bilateral airspace opacities again  noted, unchanged. No visible effusions or pneumothorax. IMPRESSION: Patchy bilateral airspace disease, stable. Electronically Signed   By: Rolm Baptise M.D.   On: 05/25/2019 10:09   DG Chest Port 1 View  Result Date: 05/26/2019 CLINICAL DATA:  Respiratory failure.  COVID positive. EXAM: PORTABLE CHEST 1 VIEW COMPARISON:  Chest x-ray from yesterday. FINDINGS: Unchanged endotracheal and enteric tubes. Stable cardiomediastinal silhouette. Bilateral interstitial and hazy airspace opacities appear mildly worsened in the right lung and not significantly changed in the left lung. No pleural effusion or pneumothorax. No acute osseous abnormality. IMPRESSION: Multifocal pneumonia, worsened in the right lung when compared to yesterday. Electronically Signed   By: Titus Dubin M.D.   On: 05/26/2019 12:39    ASSESSMENT / PLAN:  Acute hypoxic/hypercarbic respiratory failure secondary to COVID-19 pneumonia  Improving hypoxemia, weaning FiO2/PEEP as tolerated Metabolic acidosis- resolved Hx: COPD Continue mechanical ventilation per ARDS protocol Target TVol 6-8cc/kgIBW Target Plateau Pressure < 30cm H20 Target driving pressure less than 15 cm of water Target PaO2 55-65: titrate PEEP/FiO2 per protocol As Santoli as PaO2 to FiO2 ratio is less than 1:150 position in prone position for 16 hours a day Ventilator associated pneumonia prevention protocol Patient was prone to 3/2 Recruitment maneuvers performed-tolerated well On bronchodilators Nebulized budesonide Decadron Remdesivir-to complete 3/5 Ivermectin dosing 3/5 and 3/7 to complete protocol recommended  COVID-19 Pneumonia Query superimposed bacterial pneumonia Leukocytosis improving Continue empiric cefepime/doxycycline (avoiding QT prolonging antibiotics) Cultures unremarkable to date Continue remdesivir (last dose 3/5) Continue Decadron Continue to trend inflammatory markers CRP 9.4>>20.1>>11.5 Elevated D-dimer: Rechecking  AKI on  CKD Metabolic Acidosis: Resolved Electrolyte derangements Monitor I&O's / urinary output  trend electrolytes Avoid nephrotoxic's Replete phosphorus Add free water via tube for hypernatremia  Shock physiology resolved Mildly elevated Troponin, likely demand ischemia Bradycardia, resolved avoiding negative chronotropic agents Hx: HTN, CHF, HLD Continuous cardiac monitoring Maintain MAP >65 Volume resuscitated Vasopressors as needed to maintain MAP goal (currently on low-dose norepi) Continue to hold home antihypertensives 2D Echocardiogram 3/1: LVEF 70 to 75%.  Hyperdynamic left ventricular function, left concentric ventricular hypertrophy, diastology normal normal right ventricular function.  Normal valve function Avoid negative chronotropic agents due to bradycardia Currently on Versed Review of [facility] shows that she has had bradycardia since 2017, etiology unknown TSH/free T4 consistent with "sick euthyroid"   Anemia of critical illness H&H 9.4 and 29.8 No evidence of active bleeding Transfuse for Hgb less than 7 Check D-dimer If D-dimer on downward trend can switch to subcu DVT prophylaxis and discontinue heparin infusion  Hyperglycemia CBG's ICU SSI protocol  Nutrition Tube feed protocol Constipation protocol   DISPOSITION: ICU GOALS OF CARE: Full code VTE PROPHYLAXIS: Has been on heparin infusion, transition to subcu UPDATES: We will update family [id number].  The patient is critically ill with multiple organ systems failure and requires high complexity decision making for assessment and support, frequent evaluation and titration of therapies, application of advanced monitoring technologies and extensive interpretation of multiple databases. Critical Care Time devoted to patient care services described in this note independent of APP/resident time (if applicable)  is 31 minutes.   Sherrilyn Rist MD [facility]  pager: [phone] If unanswered, please page CCM On-call: [phone]

## 2019-05-28 ENCOUNTER — Inpatient Hospital Stay (HOSPITAL_COMMUNITY): Payer: Medicaid Other

## 2019-05-28 LAB — POCT I-STAT 7, (LYTES, BLD GAS, ICA,H+H)
Bicarbonate: 24.7 mmol/L (ref 20.0–28.0)
Calcium, Ion: 1.16 mmol/L (ref 1.15–1.40)
HCT: 28 % — ABNORMAL LOW (ref 36.0–46.0)
Hemoglobin: 9.5 g/dL — ABNORMAL LOW (ref 12.0–15.0)
O2 Saturation: 86 %
Patient temperature: 98.6
Potassium: 4.5 mmol/L (ref 3.5–5.1)
Sodium: 149 mmol/L — ABNORMAL HIGH (ref 135–145)
TCO2: 26 mmol/L (ref 22–32)
pCO2 arterial: 41.6 mmHg (ref 32.0–48.0)
pH, Arterial: 7.381 (ref 7.350–7.450)
pO2, Arterial: 52 mmHg — ABNORMAL LOW (ref 83.0–108.0)

## 2019-05-28 LAB — BASIC METABOLIC PANEL
Anion gap: 9 (ref 5–15)
BUN: 36 mg/dL — ABNORMAL HIGH (ref 8–23)
CO2: 25 mmol/L (ref 22–32)
Calcium: 8.1 mg/dL — ABNORMAL LOW (ref 8.9–10.3)
Chloride: 114 mmol/L — ABNORMAL HIGH (ref 98–111)
Creatinine, Ser: 1.36 mg/dL — ABNORMAL HIGH (ref 0.44–1.00)
GFR calc Af Amer: 48 mL/min — ABNORMAL LOW (ref >60)
GFR calc non Af Amer: 42 mL/min — ABNORMAL LOW (ref >60)
Glucose, Bld: 172 mg/dL — ABNORMAL HIGH (ref 70–99)
Potassium: 4.5 mmol/L (ref 3.5–5.1)
Sodium: 148 mmol/L — ABNORMAL HIGH (ref 135–145)

## 2019-05-28 LAB — CBC
HCT: 30.5 % — ABNORMAL LOW (ref 36.0–46.0)
Hemoglobin: 9.6 g/dL — ABNORMAL LOW (ref 12.0–15.0)
MCH: 29.3 pg (ref 26.0–34.0)
MCHC: 31.5 g/dL (ref 30.0–36.0)
MCV: 93 fL (ref 80.0–100.0)
Platelets: 362 10*3/uL (ref 150–400)
RBC: 3.28 MIL/uL — ABNORMAL LOW (ref 3.87–5.11)
RDW: 13.9 % (ref 11.5–15.5)
WBC: 16.4 10*3/uL — ABNORMAL HIGH (ref 4.0–10.5)
nRBC: 0.4 % — ABNORMAL HIGH (ref 0.0–0.2)

## 2019-05-28 LAB — CULTURE, BLOOD (ROUTINE X 2)
Culture: NO GROWTH
Culture: NO GROWTH

## 2019-05-28 LAB — MAGNESIUM: Magnesium: 2 mg/dL (ref 1.7–2.4)

## 2019-05-28 LAB — HEPARIN LEVEL (UNFRACTIONATED)
Heparin Unfractionated: <0.1 IU/mL — ABNORMAL LOW (ref 0.30–0.70)
Heparin Unfractionated: 0.13 IU/mL — ABNORMAL LOW (ref 0.30–0.70)

## 2019-05-28 LAB — GLUCOSE, CAPILLARY
Glucose-Capillary: 156 mg/dL — ABNORMAL HIGH (ref 70–99)
Glucose-Capillary: 143 mg/dL — ABNORMAL HIGH (ref 70–99)
Glucose-Capillary: 132 mg/dL — ABNORMAL HIGH (ref 70–99)
Glucose-Capillary: 157 mg/dL — ABNORMAL HIGH (ref 70–99)
Glucose-Capillary: 164 mg/dL — ABNORMAL HIGH (ref 70–99)
Glucose-Capillary: 151 mg/dL — ABNORMAL HIGH (ref 70–99)

## 2019-05-28 LAB — PHOSPHORUS: Phosphorus: 1.8 mg/dL — ABNORMAL LOW (ref 2.5–4.6)

## 2019-05-28 MED ORDER — SODIUM CHLORIDE 0.9 % IV SOLN
100.0000 mg | Freq: Every day | INTRAVENOUS | Status: AC
  Administered 2019-05-28: 100 mg via INTRAVENOUS
  Filled 2019-05-28: qty 20

## 2019-05-28 MED ORDER — FAMOTIDINE 40 MG/5ML PO SUSR
20.0000 mg | Freq: Every day | ORAL | Status: DC
  Administered 2019-05-28 – 2019-06-08 (×12): 20 mg
  Filled 2019-05-28 (×11): qty 2.5

## 2019-05-28 MED ORDER — POTASSIUM PHOSPHATES 15 MMOLE/5ML IV SOLN
20.0000 mmol | Freq: Once | INTRAVENOUS | Status: AC
  Administered 2019-05-28: 12:00:00 20 mmol via INTRAVENOUS
  Filled 2019-05-28: qty 6.67

## 2019-05-28 MED ORDER — PRO-STAT SUGAR FREE PO LIQD
30.0000 mL | Freq: Three times a day (TID) | ORAL | Status: DC
  Administered 2019-05-28 – 2019-06-08 (×35): 30 mL
  Filled 2019-05-28 (×34): qty 30

## 2019-05-28 MED ORDER — VITAL AF 1.2 CAL PO LIQD
1000.0000 mL | ORAL | Status: DC
  Administered 2019-05-28 – 2019-06-01 (×5): 1000 mL

## 2019-05-28 MED ORDER — ASCORBIC ACID 500 MG PO TABS
500.0000 mg | ORAL_TABLET | Freq: Every day | ORAL | Status: DC
  Administered 2019-05-28 – 2019-06-01 (×5): 500 mg
  Filled 2019-05-28 (×6): qty 1

## 2019-05-28 NOTE — Progress Notes (Signed)
Name: Anne [name] MRN: [id number] DOB: Sep 06, 1955    ADMISSION DATE:  06/17/2019 to [place]:  05/23/2019 at [facility]  Diagnosis: Respiratory failure, hypoxic, due to COVID-19; ventilator dependence.  BRIEF PATIENT DESCRIPTION:  64 y.o. Female admitted 2/27 with acute hypoxic/hypercarbic respiratory failure secondary to [id number] pneumonia complicated by severe metabolic acidosis, hypotension, along with severe AKI requiring bicarb gtt. failed noninvasive ventilation, intubated now mechanically ventilated.  SIGNIFICANT EVENTS  2/27- Admission to [name] unit 2/27- Transfer to Stepdown due to hypotension, increasing FiO2 requirements, metabolic acidosis requiring Bicarb gtt 2/28- intubated, initiated mechanical ventilation. Central venous access right femoral placed 3/01- continued issues with oxygenation despite mechanical ventilation, placed in prone position 3/02- oxygenation remains tenuous, resumed supine position, recruitment maneuvers initiated, patient tolerating, initiating potential transfer to [facility] unit 38M, spoke with Dr. Kara Mead 3/03-[place] 38M unit as bed available for patient will transfer.  Oxygen requirements are decreasing. 3/03: Admitted to  3/4: No overnight events  STUDIES:  2/27- CXR>>Multifocal pneumonia   2/27- CT Renal Stone Study>> 1. Left nephrolithiasis, without obstructive uropathy. 2. Bibasilar peripheral ground-glass opacities, consistent with COVID-19 pneumonia. More focal nodular opacity in the left lower lobe could represent an area of consolidation or a true pulmonary nodule. Recommend follow-up chest CT at 3 months, after the patient is recovered from the acute illness. 3. Suspicion of cirrhosis, especially given the history of hepatitis B and C. 4.  Aortic Atherosclerosis  CULTURES: POC SARS-CoV-2 2/27>> POSITIVE Blood culture x2 2/27>> negative Strep pneumo urinary antigen 2/27>>  negative Legionella urinary antigen 2/27>> negative  ANTIBIOTICS: Cefepime >>3/2  Doxycycline >> 3/2   Allergies  Allergen Reactions  . Ibuprofen Other (See Comments)    Daughter unsure of this- patient unable to comment  . Nitrofuran Derivatives Other (See Comments)    Daughter unsure of this- patient unable to comment  . Ribavirin Nausea And Vomiting    REVIEW OF SYSTEMS: Unable to perform due to critical illness, mechanically ventilated status.  SUBJECTIVE:   Tolerated transfer well Does not appear to be in any distress  VITAL SIGNS: Temp:  [97.5 F (36.4 C)-99.1 F (37.3 C)] 97.5 F (36.4 C) (03/04 1000) Pulse Rate:  [55-81] 66 (03/04 1000) Resp:  [10-35] 15 (03/04 1000) BP: (85-120)/(48-68) 95/57 (03/04 1000) SpO2:  [88 %-100 %] 96 % (03/04 1000) FiO2 (%):  [40 %-100 %] 60 % (03/04 0845) Weight:  [103.9 kg-105.1 kg] 103.9 kg (03/04 0500)  VENT SETTINGS: Vent Mode: PRVC FiO2 (%):  [40 %-100 %] 60 % Set Rate:  [34 bmp] 34 bmp Vt Set:  [310 mL-400 mL] 310 mL PEEP:  [15 cmH20-16 cmH20] 16 cmH20 Plateau Pressure:  [18 cmH20-31 cmH20] 25 cmH20    PHYSICAL EXAMINATION: Middle-aged lady, does not appear to be in distress Moist oral mucosa, endotracheal tube in place Chest: clear breath sounds S1-S2 appreciated  Abdomen is soft, bowel sounds appreciated Extremities shows no clubbing, no edema Neurologically-sedated  ABG 7.3 8/42/52  Recent Labs  Lab 05/26/19 0431 05/26/19 0431 05/25/2019 0449 05/28/19 0533 05/28/19 0611  NA 146*   < > 149* 149* 148*  K 4.8   < > 5.0 4.5 4.5  CL 110  --  115*  --  114*  CO2 24  --  24  --  25  BUN 30*  --  31*  --  36*  CREATININE 1.69*  --  1.46*  --  1.36*  GLUCOSE 196*  --  158*  --  172*   < > = values in this interval not displayed.   Recent Labs  Lab 05/26/19 0431 05/26/19 0431 06/13/2019 0449 05/28/19 0533 05/28/19 0611  HGB 11.1*   < > 9.4* 9.5* 9.6*  HCT 34.3*   < > 29.8* 28.0* 30.5*  WBC 25.0*  --   16.7*  --  16.4*  PLT 348  --  303  --  362   < > = values in this interval not displayed.   DG Abd 1 View  Result Date: 05/30/2019 CLINICAL DATA:  64 year old female with positive COVID-19. EXAM: ABDOMEN - 1 VIEW COMPARISON:  Abdominal radiograph dated 05/25/2019. FINDINGS: An enteric tube is noted with side-port in the proximal stomach and tip likely in the distal stomach. No bowel dilatation or evidence of obstruction. No free air identified. No acute osseous pathology. IMPRESSION: Enteric tube with tip in the distal stomach. Electronically Signed   By: Anner Crete M.D.   On: 06/15/2019 19:27   DG Chest Port 1 View  Result Date: 05/28/2019 CLINICAL DATA:  Respiratory failure. EXAM: PORTABLE CHEST 1 VIEW COMPARISON:  05/29/2019 FINDINGS: Endotracheal tube has advanced, now terminating 1 cm from the carina. NG tube courses off the inferior border the film. Diffuse interstitial and airspace opacities have increased. IMPRESSION: 1. Endotracheal tube is now 1 cm from the carina and could be pulled back 2 cm for more optimal positioning. 2. Increasing interstitial and airspace disease consistent with infection, edema, or ARDS. These results will be called to the ordering clinician or representative by the Radiologist Assistant, and communication documented in the PACS or zVision Dashboard. Electronically Signed   By: San Morelle M.D.   On: 05/28/2019 07:36   DG CHEST PORT 1 VIEW  Result Date: 06/09/2019 CLINICAL DATA:  COVID EXAM: PORTABLE CHEST 1 VIEW COMPARISON:  Earlier same day FINDINGS: Endotracheal and enteric tubes are unchanged. Multiple additional lines overlie the patient. Persistent interstitial prominence and patchy opacities with some interval improvement in lung aeration. No pleural effusion. No pneumothorax. Stable cardiomediastinal contours. IMPRESSION: Improvement in lung aeration since earlier same day with otherwise persistent bilateral patchy opacities. Electronically Signed    By: Macy Mis M.D.   On: 06/11/2019 19:27   DG Chest Port 1 View  Result Date: 06/05/2019 CLINICAL DATA:  Respiratory failure, COVID pneumonia EXAM: PORTABLE CHEST 1 VIEW COMPARISON:  05/26/2019 FINDINGS: Support devices are stable. Cardiomegaly. Patchy bilateral airspace opacities again noted, unchanged. No visible effusions or pneumothorax. IMPRESSION: Patchy bilateral airspace disease, stable. Electronically Signed   By: Rolm Baptise M.D.   On: 05/28/2019 10:09   DG Chest Port 1 View  Result Date: 05/26/2019 CLINICAL DATA:  Respiratory failure.  COVID positive. EXAM: PORTABLE CHEST 1 VIEW COMPARISON:  Chest x-ray from yesterday. FINDINGS: Unchanged endotracheal and enteric tubes. Stable cardiomediastinal silhouette. Bilateral interstitial and hazy airspace opacities appear mildly worsened in the right lung and not significantly changed in the left lung. No pleural effusion or pneumothorax. No acute osseous abnormality. IMPRESSION: Multifocal pneumonia, worsened in the right lung when compared to yesterday. Electronically Signed   By: Titus Dubin M.D.   On: 05/26/2019 12:39    ASSESSMENT / PLAN:  Acute hypoxic/hypercarbic respiratory failure secondary to COVID-19 pneumonia  Improving hypoxemia, weaning FiO2/PEEP as tolerated Metabolic acidosis- resolved Hx: COPD Continue mechanical ventilation per ARDS protocol Target TVol 6-8cc/kgIBW Target Plateau Pressure < 30cm H20 Target driving pressure less than 15 cm of water Target PaO2 55-65: titrate PEEP/FiO2  per protocol As [place] as PaO2 to FiO2 ratio is less than 1:150 position in prone position for 16 hours a day Ventilator associated pneumonia prevention protocol Patient was prone to 3/2 Recruitment maneuvers performed-tolerated well On bronchodilators Decadron Remdesivir-to complete 3/5 Ivermectin dosing 3/5 and 3/7 to complete protocol recommended-this is discontinued at present  COVID-19 Pneumonia Query superimposed  bacterial pneumonia Leukocytosis improving Continue empiric cefepime/doxycycline (avoiding QT prolonging antibiotics) Cultures unremarkable to date Continue remdesivir (last dose 3/5) Continue Decadron Continue to trend inflammatory markers CRP 9.4>>20.1>>11.5 Elevated D-dimer: Continue full dose anticoagulation  AKI on CKD Metabolic Acidosis: Resolved Electrolyte derangements Monitor I&O's / urinary output  trend electrolytes Avoid nephrotoxic's Replete phosphorus Add free water via tube for hypernatremia  Shock physiology resolved Mildly elevated Troponin, likely demand ischemia Bradycardia, resolved avoiding negative chronotropic agents Hx: HTN, CHF, HLD Continuous cardiac monitoring Maintain MAP >65 Volume resuscitated Vasopressors as needed to maintain MAP goal (currently on low-dose norepi) Continue to hold home antihypertensives 2D Echocardiogram 3/1: LVEF 70 to 75%.  Hyperdynamic left ventricular function, left concentric ventricular hypertrophy, diastology normal normal right ventricular function.  Normal valve function Avoid negative chronotropic agents due to bradycardia Currently on Versed Review of [facility] records shows that she has had bradycardia since 2017, etiology unknown TSH/free T4 consistent with "sick euthyroid"   Anemia of critical illness H&H 9.4 and 29.8 No evidence of active bleeding Transfuse for Hgb less than 7 -Continue full dose anticoagulation  Hyperglycemia CBG's ICU SSI protocol  Nutrition Tube feed protocol Constipation protocol   DISPOSITION: ICU GOALS OF CARE: Full code VTE PROPHYLAXIS: On full dose anticoagulation UPDATES: Will update  The patient is critically ill with multiple organ systems failure and requires high complexity decision making for assessment and support, frequent evaluation and titration of therapies, application of advanced monitoring technologies and extensive interpretation of multiple databases.  Critical Care Time devoted to patient care services described in this note independent of APP/resident time (if applicable)  is 35 minutes.   Sherrilyn Rist MD [facility] Personal pager: [phone] If unanswered, please page CCM On-call: [phone]

## 2019-05-28 NOTE — Plan of Care (Signed)
  Problem: Clinical Measurements: Goal: Ability to maintain clinical measurements within normal limits will improve 05/28/2019 2052 by Alver Fisher, RN Outcome: Progressing 05/28/2019 2051 by Alver Fisher, RN Outcome: Progressing Goal: Will remain free from infection 05/28/2019 2052 by Alver Fisher, RN Outcome: Progressing 05/28/2019 2051 by Alver Fisher, RN Outcome: Progressing Goal: Diagnostic test results will improve 05/28/2019 2052 by Alver Fisher, RN Outcome: Progressing 05/28/2019 2051 by Alver Fisher, RN Outcome: Progressing Goal: Respiratory complications will improve 05/28/2019 2052 by Alver Fisher, RN Outcome: Progressing 05/28/2019 2051 by Alver Fisher, RN Outcome: Progressing Goal: Cardiovascular complication will be avoided 05/28/2019 2052 by Alver Fisher, RN Outcome: Progressing 05/28/2019 2051 by Alver Fisher, RN Outcome: Progressing   Problem: Skin Integrity: Goal: Risk for impaired skin integrity will decrease 05/28/2019 2052 by Alver Fisher, RN Outcome: Progressing 05/28/2019 2051 by Alver Fisher, RN Outcome: Progressing   Problem: Safety: Goal: Ability to remain free from injury will improve 05/28/2019 2052 by Alver Fisher, RN Outcome: Progressing 05/28/2019 2051 by Alver Fisher, RN Outcome: Progressing   Problem: Respiratory: Goal: Will maintain a patent airway 05/28/2019 2052 by Alver Fisher, RN Outcome: Progressing 05/28/2019 2051 by Alver Fisher, RN Outcome: Progressing Goal: Complications related to the disease process, condition or treatment will be avoided or minimized 05/28/2019 2052 by Alver Fisher, RN Outcome: Progressing 05/28/2019 2051 by Alver Fisher, RN Outcome: Progressing

## 2019-05-28 NOTE — Progress Notes (Signed)
Initial Nutrition Assessment  DOCUMENTATION CODES:   Obesity unspecified  INTERVENTION:   Tube Feeding:  Vital AF 1.2 at 40 ml/hr Pro-Stat 30 mL TID Provides 1452 kcals, 117 g of protein and 778 mLof free water  Total Free Water with TF plus 200 mL q 4 hour FWF: 1978 mL   MVI with Minerals daily  NUTRITION DIAGNOSIS:   Inadequate oral intake related to acute illness as evidenced by NPO status.   GOAL:   Provide needs based on ASPEN/SCCM guidelines  MONITOR:   Vent status, Weight trends, Labs, TF tolerance, Skin  REASON FOR ASSESSMENT:   Consult, Ventilator Enteral/tube feeding initiation and management  ASSESSMENT:   64 yo female admitted to [facility] with admission for acute respiratory failure secondary to COVID-19 pneumonia requiring intubation [id number], metabolic acidosis, AKI. PMH includes CHF, COPD, hepatitis C   2/27 Admit to [facility] 2/28 Intubated 3/03 Transferred to [place] position as able  Patient is currently intubated on ventilator support MV: 9.6 L/min Temp (24hrs), Avg:98.1 F (36.7 C), Min:97.5 F (36.4 C), Max:99.1 F (37.3 C)  Unable to obtain diet and weight history from patient at this time  Current weight 103.9 kg; noted weight of 101.9 kg on 3/3 at discharge from [facility] High Protein at 40 ml/hr started via Adult TF protocol via OG tube Free water 200 mL q 4 hours  Labs: sodium 148 (H), Creatinine 1.36, phosphorus 1.8 (L) Meds: decadron, D5-1/2  At 50 ml/hr, Vit C, ss novolog, potassium phosphate   Diet Order:   Diet Order    None      EDUCATION NEEDS:   Not appropriate for education at this time  Skin:  Skin Assessment: Reviewed RN Assessment  Last BM:  2/28  Height:   Ht Readings from Last 1 Encounters:  06/03/2019 5' 2.99" (1.6 m)    Weight:   Wt Readings from Last 1 Encounters:  05/28/19 103.9 kg    Ideal Body Weight:  52.3 kg(Adjusted BW 72 kg)  BMI:  Body mass index is 40.59  kg/m.  Estimated Nutritional Needs:   Kcal:  1224-1620 kcals  Protein:  100-130 g  Fluid:  >/= 1.8 L   Kerman Passey MS, RDN, LDN, CNSC RD Pager Number and Weekend/On-Call After Hours Pager Located in [place]

## 2019-05-28 NOTE — Progress Notes (Signed)
ANTICOAGULATION CONSULT NOTE  Pharmacy Consult for heparin Indication: Possible pulmonary embolus  Patient Measurements: Height: 5' 2.99" (160 cm) Weight: 229 lb 0.9 oz (103.9 kg) IBW/kg (Calculated) : 52.38 Heparin Dosing Weight: 72 kg  Vital Signs: Temp: 97.7 F (36.5 C) (03/04 0900) Temp Source: Rectal (03/04 0400) BP: 95/60 (03/04 0900) Pulse Rate: 69 (03/04 0900)  Labs: Recent Labs    05/26/19 0431 05/26/19 0431 05/26/19 0810 05/26/19 2200 06/04/2019 0449 05/28/2019 0449 05/28/19 0533 05/28/19 0611  HGB 11.1*   < >  --   --  9.4*   < > 9.5* 9.6*  HCT 34.3*   < >  --   --  29.8*  --  28.0* 30.5*  PLT 348  --   --   --  303  --   --  362  HEPARINUNFRC  --   --    < > 0.34 0.35  --   --  <0.10*  CREATININE 1.69*  --   --   --  1.46*  --   --  1.36*   < > = values in this interval not displayed.    Estimated Creatinine Clearance: 49.4 mL/min (A) (by C-G formula based on SCr of 1.36 mg/dL (H)).   Medical History: Past Medical History:  Diagnosis Date  . Benign essential HTN 11/16/2014  . Breathlessness on exertion 09/03/2013  . CHF (congestive heart failure) ([place])   . COPD (chronic obstructive pulmonary disease) ([place])   . Exposure to hepatitis B 12/10/2014  . Heart valve disease 09/03/2013  . Hepatitis C    took Harvoni. now clear.  Marland Kitchen History of cardiac catheterization 03/24/2014   Overview:  With normal coronaries 2015     Medications:  Scheduled:  . ascorbic acid  500 mg Per Tube Daily  . budesonide (PULMICORT) nebulizer solution  0.25 mg Nebulization Q12H  . chlorhexidine gluconate (MEDLINE KIT)  15 mL Mouth Rinse BID  . Chlorhexidine Gluconate Cloth  6 each Topical Daily  . dexamethasone (DECADRON) injection  6 mg Intravenous Daily  . feeding supplement (PRO-STAT SUGAR FREE 64)  30 mL Per Tube BID  . feeding supplement (VITAL HIGH PROTEIN)  1,000 mL Per Tube Q24H  . fentaNYL (SUBLIMAZE) injection  50 mcg Intravenous Once  . free water  200 mL Per Tube Q4H   . insulin aspart  0-6 Units Subcutaneous Q4H  . ipratropium-albuterol  3 mL Nebulization Q6H  . mouth rinse  15 mL Mouth Rinse 10 times per day    Assessment: Patient admitted x SOB found to have COVID + pneumonia was suspected to have PE but CT PE not performed d/t AKI on CKD. Patient is being placed on heparin drip for treatment of possible PE d/t elevated d-dimer.  H&H lower than baseline but stable, platelets stable. D-dimer 2.09.   Of note heparin level this AM is undetectable despite being therapeutic for days. Likely could be a result of improving renal fx. No s/s of bleeding noted    Goal of Therapy:  Heparin level 0.3-0.7 units/ml Monitor platelets by anticoagulation protocol: Yes   Plan:  -Increase IV heparin to 600 units/hr  -F/u 6 hr HL -Monitor daily HL, CBC and s/s of bleeding   Albertina Parr, PharmD., BCPS Clinical Pharmacist Clinical phone for 05/28/19 until 3:30pm: [phone]

## 2019-05-28 NOTE — Progress Notes (Signed)
Wasted 25cc of Versed with Kieth Brightly, Therapist, sports

## 2019-05-28 NOTE — Plan of Care (Signed)
  Problem: Skin Integrity: Goal: Risk for impaired skin integrity will decrease 05/28/2019 2052 by Alver Fisher, RN Outcome: Progressing 05/28/2019 2051 by Alver Fisher, RN Outcome: Progressing   Problem: Respiratory: Goal: Will maintain a patent airway 05/28/2019 2052 by Alver Fisher, RN Outcome: Progressing 05/28/2019 2051 by Alver Fisher, RN Outcome: Progressing Goal: Complications related to the disease process, condition or treatment will be avoided or minimized 05/28/2019 2052 by Alver Fisher, RN Outcome: Progressing 05/28/2019 2051 by Alver Fisher, RN Outcome: Progressing   Problem: Pain Managment: Goal: General experience of comfort will improve 05/28/2019 2052 by Alver Fisher, RN Outcome: Progressing 05/28/2019 2051 by Alver Fisher, RN Outcome: Progressing   Problem: Safety: Goal: Ability to remain free from injury will improve 05/28/2019 2052 by Alver Fisher, RN Outcome: Progressing 05/28/2019 2051 by Alver Fisher, RN Outcome: Progressing

## 2019-05-29 ENCOUNTER — Inpatient Hospital Stay: Payer: Self-pay

## 2019-05-29 LAB — BASIC METABOLIC PANEL
Anion gap: 9 (ref 5–15)
BUN: 45 mg/dL — ABNORMAL HIGH (ref 8–23)
CO2: 25 mmol/L (ref 22–32)
Calcium: 8.9 mg/dL (ref 8.9–10.3)
Chloride: 110 mmol/L (ref 98–111)
Creatinine, Ser: 1.26 mg/dL — ABNORMAL HIGH (ref 0.44–1.00)
GFR calc Af Amer: 53 mL/min — ABNORMAL LOW (ref >60)
GFR calc non Af Amer: 46 mL/min — ABNORMAL LOW (ref >60)
Glucose, Bld: 144 mg/dL — ABNORMAL HIGH (ref 70–99)
Potassium: 4.7 mmol/L (ref 3.5–5.1)
Sodium: 144 mmol/L (ref 135–145)

## 2019-05-29 LAB — CBC
HCT: 30.9 % — ABNORMAL LOW (ref 36.0–46.0)
Hemoglobin: 9.9 g/dL — ABNORMAL LOW (ref 12.0–15.0)
MCH: 29.3 pg (ref 26.0–34.0)
MCHC: 32 g/dL (ref 30.0–36.0)
MCV: 91.4 fL (ref 80.0–100.0)
Platelets: 347 10*3/uL (ref 150–400)
RBC: 3.38 MIL/uL — ABNORMAL LOW (ref 3.87–5.11)
RDW: 13.8 % (ref 11.5–15.5)
WBC: 15 10*3/uL — ABNORMAL HIGH (ref 4.0–10.5)
nRBC: 0.6 % — ABNORMAL HIGH (ref 0.0–0.2)

## 2019-05-29 LAB — HEMOGLOBIN AND HEMATOCRIT, BLOOD
HCT: 31.5 % — ABNORMAL LOW (ref 36.0–46.0)
Hemoglobin: 10.2 g/dL — ABNORMAL LOW (ref 12.0–15.0)

## 2019-05-29 LAB — HEPARIN LEVEL (UNFRACTIONATED)
Heparin Unfractionated: 0.13 IU/mL — ABNORMAL LOW (ref 0.30–0.70)
Heparin Unfractionated: 0.27 IU/mL — ABNORMAL LOW (ref 0.30–0.70)
Heparin Unfractionated: 0.37 IU/mL (ref 0.30–0.70)

## 2019-05-29 LAB — GLUCOSE, CAPILLARY
Glucose-Capillary: 133 mg/dL — ABNORMAL HIGH (ref 70–99)
Glucose-Capillary: 139 mg/dL — ABNORMAL HIGH (ref 70–99)
Glucose-Capillary: 144 mg/dL — ABNORMAL HIGH (ref 70–99)
Glucose-Capillary: 163 mg/dL — ABNORMAL HIGH (ref 70–99)
Glucose-Capillary: 172 mg/dL — ABNORMAL HIGH (ref 70–99)
Glucose-Capillary: 179 mg/dL — ABNORMAL HIGH (ref 70–99)

## 2019-05-29 LAB — MAGNESIUM: Magnesium: 1.9 mg/dL (ref 1.7–2.4)

## 2019-05-29 LAB — PHOSPHORUS: Phosphorus: 2.5 mg/dL (ref 2.5–4.6)

## 2019-05-29 MED ORDER — SODIUM CHLORIDE 0.9% FLUSH
10.0000 mL | INTRAVENOUS | Status: DC | PRN
  Administered 2019-06-03 (×2): 10 mL

## 2019-05-29 MED ORDER — SODIUM CHLORIDE 0.9% FLUSH
10.0000 mL | Freq: Two times a day (BID) | INTRAVENOUS | Status: DC
  Administered 2019-05-29 – 2019-06-13 (×23): 10 mL

## 2019-05-29 MED ORDER — DEXMEDETOMIDINE HCL IN NACL 400 MCG/100ML IV SOLN
0.4000 ug/kg/h | INTRAVENOUS | Status: DC
  Administered 2019-05-29: 0.4 ug/kg/h via INTRAVENOUS
  Filled 2019-05-29: qty 100

## 2019-05-29 NOTE — Plan of Care (Signed)

## 2019-05-29 NOTE — Progress Notes (Signed)
ANTICOAGULATION CONSULT NOTE  Pharmacy Consult for heparin Indication: Possible pulmonary embolus  Patient Measurements: Height: 5' 2.99" (160 cm) Weight: 237 lb 7 oz (107.7 kg) IBW/kg (Calculated) : 52.38 Heparin Dosing Weight: 72 kg  Vital Signs: Temp: 99.2 F (37.3 C) (03/05 0315) Temp Source: Oral (03/05 0315) BP: 113/56 (03/05 0500) Pulse Rate: 61 (03/05 0500)  Labs: Recent Labs    06/16/2019 0449 06/12/2019 0449 05/28/19 0533 05/28/19 0611 05/28/19 2015 05/29/19 0500  HGB 9.4*   < > 9.5* 9.6*  --  9.9*  HCT 29.8*   < > 28.0* 30.5*  --  30.9*  PLT 303  --   --  362  --  347  HEPARINUNFRC 0.35   < >  --  <0.10* 0.13* 0.13*  CREATININE 1.46*  --   --  1.36*  --   --    < > = values in this interval not displayed.    Estimated Creatinine Clearance: 50.4 mL/min (A) (by C-G formula based on SCr of 1.36 mg/dL (H)).   Medical History: Past Medical History:  Diagnosis Date  . Benign essential HTN 11/16/2014  . Breathlessness on exertion 09/03/2013  . CHF (congestive heart failure) ([place])   . COPD (chronic obstructive pulmonary disease) ([place])   . Exposure to hepatitis B 12/10/2014  . Heart valve disease 09/03/2013  . Hepatitis C    took Harvoni. now clear.  Marland Kitchen History of cardiac catheterization 03/24/2014   Overview:  With normal coronaries 2015     Medications:  Scheduled:  . ascorbic acid  500 mg Per Tube Daily  . chlorhexidine gluconate (MEDLINE KIT)  15 mL Mouth Rinse BID  . Chlorhexidine Gluconate Cloth  6 each Topical Daily  . dexamethasone (DECADRON) injection  6 mg Intravenous Daily  . famotidine  20 mg Per Tube Daily  . feeding supplement (PRO-STAT SUGAR FREE 64)  30 mL Per Tube TID  . feeding supplement (VITAL AF 1.2 CAL)  1,000 mL Per Tube Q24H  . free water  200 mL Per Tube Q4H  . insulin aspart  0-6 Units Subcutaneous Q4H  . ipratropium-albuterol  3 mL Nebulization Q6H  . mouth rinse  15 mL Mouth Rinse 10 times per day    Assessment: Patient  admitted x SOB found to have COVID + pneumonia was suspected to have PE but CT PE not performed d/t AKI on CKD. Patient is being placed on heparin drip for treatment of possible PE d/t elevated d-dimer.  H&H lower than baseline but stable, platelets stable. D-dimer 2.09.   The patient's heparin level this morning is SUBtherapeutic after a rate increase yesterday (HL 0.13). CBC is stable, no bleeding noted at this time.   Goal of Therapy:  Heparin level 0.3-0.7 units/ml Monitor platelets by anticoagulation protocol: Yes   Plan:  -Increase IV heparin to 800 units/hr  -F/u 6 hr HL -Monitor daily HL, CBC and s/s of bleeding   Thank you for allowing pharmacy to be a part of this patient's care.  Alycia Rossetti, PharmD, BCPS Clinical Pharmacist Clinical phone for 05/29/2019: [phone] 05/29/2019 7:43 AM   **Pharmacist phone directory can now be found on [email or web address] ([email or web address]).  Listed under [place].

## 2019-05-29 NOTE — Progress Notes (Signed)
Dr. Ander Slade made aware that patient's HR in the low 50's with BP stable. Precedex was turned off.

## 2019-05-29 NOTE — Progress Notes (Signed)
Name: Anne [name] MRN: [id number] DOB: 1955/05/27    ADMISSION DATE:  06/13/2019 to [place]:  05/23/2019 at [facility]  Diagnosis: Respiratory failure, hypoxic, due to COVID-19; ventilator dependence.  BRIEF PATIENT DESCRIPTION:  64 y.o. Female admitted 2/27 with acute hypoxic/hypercarbic respiratory failure secondary to [id number] pneumonia complicated by severe metabolic acidosis, hypotension, along with severe AKI requiring bicarb gtt. failed noninvasive ventilation, intubated now mechanically ventilated.  SIGNIFICANT EVENTS  2/27- Admission to [facility] unit 2/27- Transfer to Stepdown due to hypotension, increasing FiO2 requirements, metabolic acidosis requiring Bicarb gtt 2/28- intubated, initiated mechanical ventilation. Central venous access right femoral placed 3/01- continued issues with oxygenation despite mechanical ventilation, placed in prone position 3/02- oxygenation remains tenuous, resumed supine position, recruitment maneuvers initiated, patient tolerating, initiating potential transfer to [facility] unit 55M, spoke with Dr. Kara Mead 3/03-[place] 55M unit as bed available for patient will transfer.  Oxygen requirements are decreasing. 3/03: Admitted to [place] 3/4: No overnight events  STUDIES:  2/27- CXR>>Multifocal pneumonia   2/27- CT Renal Stone Study>> 1. Left nephrolithiasis, without obstructive uropathy. 2. Bibasilar peripheral ground-glass opacities, consistent with COVID-19 pneumonia. More focal nodular opacity in the left lower lobe could represent an area of consolidation or a true pulmonary nodule. Recommend follow-up chest CT at 3 months, after the patient is recovered from the acute illness. 3. Suspicion of cirrhosis, especially given the history of hepatitis B and C. 4.  Aortic Atherosclerosis  CULTURES: POC SARS-CoV-2 2/27>> POSITIVE Blood culture x2 2/27>> negative Strep pneumo urinary antigen 2/27>> negative  Legionella urinary antigen 2/27>> negative  ANTIBIOTICS: Cefepime >>3/2  Doxycycline >> 3/2   Allergies  Allergen Reactions  . Ibuprofen Other (See Comments)    Daughter unsure of this- patient unable to comment  . Nitrofuran Derivatives Other (See Comments)    Daughter unsure of this- patient unable to comment  . Ribavirin Nausea And Vomiting    REVIEW OF SYSTEMS: Unable to perform due to critical illness, mechanically ventilated status.  SUBJECTIVE:  No overnight events She is awake, does look around, attempts to follow commands-not able to help present  VITAL SIGNS: Temp:  [97.5 F (36.4 C)-99.2 F (37.3 C)] 98.9 F (37.2 C) (03/05 0750) Pulse Rate:  [56-83] 61 (03/05 0500) Resp:  [10-40] 34 (03/05 0500) BP: (88-114)/(51-71) 113/56 (03/05 0500) SpO2:  [89 %-99 %] 96 % (03/05 0801) FiO2 (%):  [50 %-60 %] 60 % (03/05 0801) Weight:  [107.7 kg] 107.7 kg (03/05 0437)  VENT SETTINGS: Vent Mode: PRVC FiO2 (%):  [50 %-60 %] 60 % Set Rate:  [34 bmp] 34 bmp Vt Set:  [310 mL-420 mL] 420 mL PEEP:  [12 cmH20-16 cmH20] 12 cmH20 Plateau Pressure:  [20 cmH20-30 cmH20] 30 cmH20    PHYSICAL EXAMINATION: Middle-aged lady, does not appear to be in distress Moist oral mucosa, endotracheal tube in place Chest: clear breath sounds S1-S2 appreciated, no murmur Abdomen is soft, bowel sounds appreciated Extremities shows no clubbing, no edema Neurologically-sedated  ABG 7.3 8/42/52  Recent Labs  Lab 05/26/19 0431 05/26/19 0431 05/26/2019 0449 05/28/19 0533 05/28/19 0611  NA 146*   < > 149* 149* 148*  K 4.8   < > 5.0 4.5 4.5  CL 110  --  115*  --  114*  CO2 24  --  24  --  25  BUN 30*  --  31*  --  36*  CREATININE 1.69*  --  1.46*  --  1.36*  GLUCOSE 196*  --  158*  --  172*   < > = values in this interval not displayed.   Recent Labs  Lab 06/01/2019 0449 06/03/2019 0449 05/28/19 0533 05/28/19 0611 05/29/19 0500  HGB 9.4*   < > 9.5* 9.6* 9.9*  HCT 29.8*   < > 28.0*  30.5* 30.9*  WBC 16.7*  --   --  16.4* 15.0*  PLT 303  --   --  362 347   < > = values in this interval not displayed.   DG Abd 1 View  Result Date: 06/11/2019 CLINICAL DATA:  64 year old female with positive COVID-19. EXAM: ABDOMEN - 1 VIEW COMPARISON:  Abdominal radiograph dated 05/25/2019. FINDINGS: An enteric tube is noted with side-port in the proximal stomach and tip likely in the distal stomach. No bowel dilatation or evidence of obstruction. No free air identified. No acute osseous pathology. IMPRESSION: Enteric tube with tip in the distal stomach. Electronically Signed   By: Anner Crete M.D.   On: 06/15/2019 19:27   DG Chest Port 1 View  Result Date: 05/28/2019 CLINICAL DATA:  Respiratory failure. EXAM: PORTABLE CHEST 1 VIEW COMPARISON:  06/01/2019 FINDINGS: Endotracheal tube has advanced, now terminating 1 cm from the carina. NG tube courses off the inferior border the film. Diffuse interstitial and airspace opacities have increased. IMPRESSION: 1. Endotracheal tube is now 1 cm from the carina and could be pulled back 2 cm for more optimal positioning. 2. Increasing interstitial and airspace disease consistent with infection, edema, or ARDS. These results will be called to the ordering clinician or representative by the Radiologist Assistant, and communication documented in the PACS or zVision Dashboard. Electronically Signed   By: San Morelle M.D.   On: 05/28/2019 07:36   DG CHEST PORT 1 VIEW  Result Date: 06/04/2019 CLINICAL DATA:  COVID EXAM: PORTABLE CHEST 1 VIEW COMPARISON:  Earlier same day FINDINGS: Endotracheal and enteric tubes are unchanged. Multiple additional lines overlie the patient. Persistent interstitial prominence and patchy opacities with some interval improvement in lung aeration. No pleural effusion. No pneumothorax. Stable cardiomediastinal contours. IMPRESSION: Improvement in lung aeration since earlier same day with otherwise persistent bilateral patchy  opacities. Electronically Signed   By: Macy Mis M.D.   On: 06/12/2019 19:27   [place] EKG SITE RITE  Result Date: 05/29/2019 If Site Rite image not attached, placement could not be confirmed due to current cardiac rhythm.   ASSESSMENT / PLAN:  Acute hypoxic/hypercarbic respiratory failure secondary to COVID-19 pneumonia  Improving hypoxemia, weaning FiO2/PEEP as tolerated Metabolic acidosis- resolved Hx: COPD Continue mechanical ventilation per ARDS protocol Target TVol 6-8cc/kgIBW Target Plateau Pressure < 30cm H20 Target driving pressure less than 15 cm of water Target PaO2 55-65: titrate PEEP/FiO2 per protocol Patient was prone to 3/2 Recruitment maneuvers performed-tolerated well On bronchodilators Decadron Remdesivir-to complete 3/5 Ivermectin-this is discontinued at present  COVID-19 Pneumonia Query superimposed bacterial pneumonia Leukocytosis improving Continue empiric cefepime/doxycycline (avoiding QT prolonging antibiotics) Cultures unremarkable to date Remdesivir (last dose 3/5) Continue Decadron Continue to trend inflammatory markers CRP 9.4>>20.1>>11.5 Elevated D-dimer: Continue full dose anticoagulation  AKI on CKD Metabolic Acidosis: Resolved Electrolyte derangements Monitor I&O's / urinary output  trend electrolytes Avoid nephrotoxic's Replete phosphorus Add free water via tube for hypernatremia  Shock physiology resolved Mildly elevated Troponin, likely demand ischemia Bradycardia, resolved avoiding negative chronotropic agents Hx: HTN, CHF, HLD Continuous cardiac monitoring  Maintain MAP >65 Volume resuscitated Vasopressors as needed to maintain MAP goal (currently on low-dose norepi)  Continue to hold home antihypertensives 2D Echocardiogram 3/1: LVEF 70 to 75%.  Hyperdynamic left ventricular function, left concentric ventricular hypertrophy, diastology normal normal right ventricular function.  Normal valve function Avoid negative chronotropic  agents due to bradycardia Currently on Versed Review of [facility] records shows that she has had bradycardia since 2017, etiology unknown TSH/free T4 consistent with "sick euthyroid"   Anemia of critical illness H&H 9.9/30.9 No evidence of active bleeding Transfuse for Hgb less than 7 -Continue full dose anticoagulation  Hyperglycemia CBG's ICU SSI protocol  Nutrition Tube feed protocol Constipation protocol  She appears alert but more interactive today Will try pressure support ventilation   DISPOSITION: ICU GOALS OF CARE: Full code VTE PROPHYLAXIS: On full dose anticoagulation UPDATES: Will update  The patient is critically ill with multiple organ systems failure and requires high complexity decision making for assessment and support, frequent evaluation and titration of therapies, application of advanced monitoring technologies and extensive interpretation of multiple databases. Critical Care Time devoted to patient care services described in this note independent of APP/resident time (if applicable)  is 31 minutes.   Sherrilyn Rist MD Mobile Pulmonary Critical Care Personal pager: [phone] If unanswered, please page CCM On-call: [phone]

## 2019-05-29 NOTE — Progress Notes (Signed)
Peripherally Inserted Central Catheter/Midline Placement  The IV Nurse has discussed with the patient and/or persons authorized to consent for the patient, the purpose of this procedure and the potential benefits and risks involved with this procedure.  The benefits include less needle sticks, lab draws from the catheter, and the patient may be discharged home with the catheter. Risks include, but not limited to, infection, bleeding, blood clot (thrombus formation), and puncture of an artery; nerve damage and irregular heartbeat and possibility to perform a PICC exchange if needed/ordered by physician.  Alternatives to this procedure were also discussed.  Bard Power PICC patient education guide, fact sheet on infection prevention and patient information card has been provided to patient /or left at bedside.    PICC Placement Documentation  PICC Double Lumen [id number] PICC Left Cephalic 44 cm 2 cm (Active)  Indication for Insertion or Continuance of Line Prolonged intravenous therapies 05/29/19 1600  Exposed Catheter (cm) 2 cm 05/29/19 1600  Site Assessment Clean;Dry;Intact 05/29/19 1600  Lumen #1 Status Flushed;Saline locked;Blood return noted 05/29/19 1600  Lumen #2 Status Flushed;Saline locked;Blood return noted 05/29/19 1600  Dressing Type Transparent;Securing device 05/29/19 1600  Dressing Status Clean;Dry;Intact;Antimicrobial disc in place 05/29/19 1600  Dressing Change Due 06/05/19 05/29/19 1600       Holley Bouche Renee 05/29/2019, 5:01 PM

## 2019-05-29 NOTE — Progress Notes (Signed)
Patient remained on the ventilator with FiO2 60%. A few fentanyl pushes [name] throughout the shift for pain. RN tried to titrate down Fentanyl to reach a RASS goal of -2, however patient became tachypneic and desynchronous with the vent.   Precedex was started to help with agitation and weaning sedation. However, patient's HR decreased to 50s so it was discontinued. Patient remained normotensive throughout the shift and pressors have remained off.   Daughter, Anne [name] was updated this morning and all questions were answered.

## 2019-05-29 NOTE — Progress Notes (Addendum)
ANTICOAGULATION CONSULT NOTE  Pharmacy Consult for Heparin Indication: Possible pulmonary embolus  Patient Measurements: Height: 5' 2.99" (160 cm) Weight: 237 lb 7 oz (107.7 kg) IBW/kg (Calculated) : 52.38 Heparin Dosing Weight: 72 kg  Vital Signs: Temp: 98.8 F (37.1 C) (03/05 1200) Temp Source: Axillary (03/05 1200) BP: 120/65 (03/05 1330) Pulse Rate: 68 (03/05 1330)  Labs: Recent Labs    05/26/2019 0449 06/20/2019 0449 05/28/19 0533 05/28/19 0611 05/28/19 0611 05/28/19 2015 05/29/19 0500 05/29/19 0840 05/29/19 1457  HGB 9.4*   < > 9.5* 9.6*  --   --  9.9*  --   --   HCT 29.8*   < > 28.0* 30.5*  --   --  30.9*  --   --   PLT 303  --   --  362  --   --  347  --   --   HEPARINUNFRC 0.35   < >  --  <0.10*   < > 0.13* 0.13*  --  0.27*  CREATININE 1.46*  --   --  1.36*  --   --   --  1.26*  --    < > = values in this interval not displayed.    Estimated Creatinine Clearance: 54.4 mL/min (A) (by C-G formula based on SCr of 1.26 mg/dL (H)).   Medical History: Past Medical History:  Diagnosis Date  . Benign essential HTN 11/16/2014  . Breathlessness on exertion 09/03/2013  . CHF (congestive heart failure) ([place])   . COPD (chronic obstructive pulmonary disease) ([place])   . Exposure to hepatitis B 12/10/2014  . Heart valve disease 09/03/2013  . Hepatitis C    took Harvoni. now clear.  Marland Kitchen History of cardiac catheterization 03/24/2014   Overview:  With normal coronaries 2015     Assessment: Patient admitted with SOB and COVID + pneumonia was suspected to have PE, but CT PE not performed due to AKI on CKD. Patient is being placed on heparin drip for treatment of possible PE due to elevated d-dimer.    Heparin level ~7 hrs after heparin infusion was increased to 800 units/hr was 0.27 units/ml, which is below the goal range for this pt. CBC stable. Per RN, no issues with IV or bleeding observed.  Goal of Therapy:  Heparin level 0.3-0.7 units/ml Monitor platelets by  anticoagulation protocol: Yes   Plan:  Increase heparin infusion to 1000 units/hr Check 6-hr heparin level Monitor daily heparin level, CBC Monitor for signs/symptoms of bleeding  Thank you for allowing pharmacy to be a part of this patient's care.  Gillermina Hu, PharmD, BCPS, [facility] Clinical Pharmacist 05/29/2019 3:42 PM

## 2019-05-29 NOTE — Progress Notes (Signed)
[place] Progress Note Patient Name: Anne [name] DOB: 07-15-1955 MRN: [id number]   Date of Service  05/29/2019  HPI/Events of Note  Notified of bloody vaginal discharge of approximately 20 cc. On heparin drip. No hemodynamic changes.  eICU Interventions  H/H with next blood draw     Intervention Category Intermediate Interventions: Bleeding - evaluation and treatment with blood products  Judd Lien 05/29/2019, 9:39 PM

## 2019-05-30 ENCOUNTER — Inpatient Hospital Stay (HOSPITAL_COMMUNITY): Payer: Medicaid Other

## 2019-05-30 LAB — CBC
HCT: 31.7 % — ABNORMAL LOW (ref 36.0–46.0)
Hemoglobin: 10.3 g/dL — ABNORMAL LOW (ref 12.0–15.0)
MCH: 29.2 pg (ref 26.0–34.0)
MCHC: 32.5 g/dL (ref 30.0–36.0)
MCV: 89.8 fL (ref 80.0–100.0)
Platelets: 397 10*3/uL (ref 150–400)
RBC: 3.53 MIL/uL — ABNORMAL LOW (ref 3.87–5.11)
RDW: 13.7 % (ref 11.5–15.5)
WBC: 23 10*3/uL — ABNORMAL HIGH (ref 4.0–10.5)
nRBC: 0.8 % — ABNORMAL HIGH (ref 0.0–0.2)

## 2019-05-30 LAB — BASIC METABOLIC PANEL
Anion gap: 11 (ref 5–15)
BUN: 50 mg/dL — ABNORMAL HIGH (ref 8–23)
CO2: 24 mmol/L (ref 22–32)
Calcium: 9.9 mg/dL (ref 8.9–10.3)
Chloride: 107 mmol/L (ref 98–111)
Creatinine, Ser: 1.25 mg/dL — ABNORMAL HIGH (ref 0.44–1.00)
GFR calc Af Amer: 53 mL/min — ABNORMAL LOW (ref >60)
GFR calc non Af Amer: 46 mL/min — ABNORMAL LOW (ref >60)
Glucose, Bld: 138 mg/dL — ABNORMAL HIGH (ref 70–99)
Potassium: 5.1 mmol/L (ref 3.5–5.1)
Sodium: 142 mmol/L (ref 135–145)

## 2019-05-30 LAB — POCT I-STAT 7, (LYTES, BLD GAS, ICA,H+H)
Bicarbonate: 24.3 mmol/L (ref 20.0–28.0)
Bicarbonate: 24.7 mmol/L (ref 20.0–28.0)
Calcium, Ion: 1.35 mmol/L (ref 1.15–1.40)
Calcium, Ion: 1.35 mmol/L (ref 1.15–1.40)
HCT: 29 % — ABNORMAL LOW (ref 36.0–46.0)
HCT: 32 % — ABNORMAL LOW (ref 36.0–46.0)
Hemoglobin: 9.9 g/dL — ABNORMAL LOW (ref 12.0–15.0)
Hemoglobin: 10.9 g/dL — ABNORMAL LOW (ref 12.0–15.0)
O2 Saturation: 87 %
O2 Saturation: 91 %
Patient temperature: 99.8
Patient temperature: 99.4
Potassium: 4.8 mmol/L (ref 3.5–5.1)
Potassium: 5.4 mmol/L — ABNORMAL HIGH (ref 3.5–5.1)
Sodium: 141 mmol/L (ref 135–145)
Sodium: 141 mmol/L (ref 135–145)
TCO2: 25 mmol/L (ref 22–32)
TCO2: 26 mmol/L (ref 22–32)
pCO2 arterial: 36.7 mmHg (ref 32.0–48.0)
pCO2 arterial: 40.1 mmHg (ref 32.0–48.0)
pH, Arterial: 7.432 (ref 7.350–7.450)
pH, Arterial: 7.4 (ref 7.350–7.450)
pO2, Arterial: 53 mmHg — ABNORMAL LOW (ref 83.0–108.0)
pO2, Arterial: 62 mmHg — ABNORMAL LOW (ref 83.0–108.0)

## 2019-05-30 LAB — HEPARIN LEVEL (UNFRACTIONATED)
Heparin Unfractionated: 0.15 IU/mL — ABNORMAL LOW (ref 0.30–0.70)
Heparin Unfractionated: 1.26 IU/mL — ABNORMAL HIGH (ref 0.30–0.70)
Heparin Unfractionated: 1.26 IU/mL — ABNORMAL HIGH (ref 0.30–0.70)

## 2019-05-30 LAB — GLUCOSE, CAPILLARY
Glucose-Capillary: 123 mg/dL — ABNORMAL HIGH (ref 70–99)
Glucose-Capillary: 147 mg/dL — ABNORMAL HIGH (ref 70–99)
Glucose-Capillary: 156 mg/dL — ABNORMAL HIGH (ref 70–99)
Glucose-Capillary: 164 mg/dL — ABNORMAL HIGH (ref 70–99)
Glucose-Capillary: 187 mg/dL — ABNORMAL HIGH (ref 70–99)
Glucose-Capillary: 197 mg/dL — ABNORMAL HIGH (ref 70–99)

## 2019-05-30 LAB — TRIGLYCERIDES: Triglycerides: 98 mg/dL (ref <150)

## 2019-05-30 MED ORDER — SODIUM CHLORIDE 0.9 % IV SOLN
100.0000 mg | Freq: Two times a day (BID) | INTRAVENOUS | Status: DC
  Administered 2019-05-30 – 2019-06-01 (×4): 100 mg via INTRAVENOUS
  Filled 2019-05-30 (×5): qty 100

## 2019-05-30 MED ORDER — HEPARIN (PORCINE) 25000 UT/250ML-% IV SOLN: 1000.0000 [IU]/h | INTRAVENOUS | Status: DC

## 2019-05-30 MED ORDER — NOREPINEPHRINE 16 MG/250ML-% IV SOLN
0.0000 ug/min | INTRAVENOUS | Status: DC
  Administered 2019-05-30: 2 ug/min via INTRAVENOUS
  Administered 2019-05-31: 3 ug/min via INTRAVENOUS
  Filled 2019-05-30 (×2): qty 250

## 2019-05-30 MED ORDER — SODIUM CHLORIDE 0.9 % IV SOLN
2.0000 g | Freq: Two times a day (BID) | INTRAVENOUS | Status: AC
  Administered 2019-05-30 – 2019-06-06 (×14): 2 g via INTRAVENOUS
  Filled 2019-05-30 (×14): qty 2

## 2019-05-30 MED ORDER — MIDAZOLAM HCL 2 MG/2ML IJ SOLN
1.0000 mg | INTRAMUSCULAR | Status: DC | PRN
  Administered 2019-05-30 – 2019-05-31 (×2): 1 mg via INTRAVENOUS
  Filled 2019-05-30 (×2): qty 2

## 2019-05-30 MED ORDER — PROPOFOL 1000 MG/100ML IV EMUL
5.0000 ug/kg/min | INTRAVENOUS | Status: DC
  Administered 2019-05-30: 5 ug/kg/min via INTRAVENOUS
  Filled 2019-05-30: qty 100

## 2019-05-30 MED ORDER — NOREPINEPHRINE 4 MG/250ML-% IV SOLN: 0.0000 ug/min | INTRAVENOUS | Status: DC

## 2019-05-30 NOTE — Progress Notes (Signed)
ANTICOAGULATION CONSULT NOTE - Follow Up Consult  Pharmacy Consult for heparin Indication: r/o VTE  Labs: Recent Labs    05/28/19 0611 05/28/19 2015 05/29/19 0500 05/29/19 0840 05/29/19 1457 05/29/19 2220 05/30/19 0452 05/30/19 0452 05/30/19 0453 05/30/19 1023 05/30/19 1455 05/30/19 1841 05/30/19 2150  HGB 9.6*  --  9.9*  --    < >  --  10.3*   < >  --  9.9* 10.9*  --   --   HCT 30.5*  --  30.9*  --    < >  --  31.7*  --   --  29.0* 32.0*  --   --   PLT 362  --  347  --   --   --  397  --   --   --   --   --   --   HEPARINUNFRC <0.10*   < > 0.13*  --    < >   < >  --   --  0.15*  --   --  1.26* 1.26*  CREATININE 1.36*  --   --  1.26*  --   --  1.25*  --   --   --   --   --   --    < > = values in this interval not displayed.    Assessment: 64yo female supratherapeutic on heparin after rate change; no gtt issues though RN notes some minor vaginal bleeding during day shift as well as some minor bleeding when PIV was accidentally pulled.  Goal of Therapy:  Heparin level 0.3-0.7 units/ml   Plan:  Will hold heparin gtt x66min then resume heparin at decreased rate of 1000 units/hr and check level in 8 hours.    Wynona Neat, PharmD, BCPS  05/30/2019,11:12 PM

## 2019-05-30 NOTE — Progress Notes (Signed)
Name: Anne [name] MRN: [id number] DOB: 11-Dec-1955    ADMISSION DATE:  06/16/2019 to [place]:  05/23/2019 at [facility]  Diagnosis: Respiratory failure, hypoxic, due to COVID-19; ventilator dependence.  BRIEF PATIENT DESCRIPTION:  64 y.o. Female admitted 2/27 with acute hypoxic/hypercarbic respiratory failure secondary to [id number] pneumonia complicated by severe metabolic acidosis, hypotension, along with severe AKI requiring bicarb gtt. failed noninvasive ventilation, intubated now mechanically ventilated.  SIGNIFICANT EVENTS  2/27- Admission to [place] unit 2/27- Transfer to Stepdown due to hypotension, increasing FiO2 requirements, metabolic acidosis requiring Bicarb gtt 2/28- intubated, initiated mechanical ventilation. Central venous access right femoral placed 3/01- continued issues with oxygenation despite mechanical ventilation, placed in prone position 3/02- oxygenation remains tenuous, resumed supine position, recruitment maneuvers initiated, patient tolerating, initiating potential transfer to [facility] unit 85M, spoke with Dr. Kara Mead 3/03-[place] 85M unit as bed available for patient will transfer.  Oxygen requirements are decreasing. 3/03: Admitted to  3/4: No overnight events  STUDIES:  2/27- CXR>>Multifocal pneumonia   2/27- CT Renal Stone Study>> 1. Left nephrolithiasis, without obstructive uropathy. 2. Bibasilar peripheral ground-glass opacities, consistent with COVID-19 pneumonia. More focal nodular opacity in the left lower lobe could represent an area of consolidation or a true pulmonary nodule. Recommend follow-up chest CT at 3 months, after the patient is recovered from the acute illness. 3. Suspicion of cirrhosis, especially given the history of hepatitis B and C. 4.  Aortic Atherosclerosis  CULTURES: POC SARS-CoV-2 2/27>> POSITIVE Blood culture x2 2/27>> negative Strep pneumo urinary antigen 2/27>>  negative Legionella urinary antigen 2/27>> negative  ANTIBIOTICS: Cefepime >>3/2  Doxycycline >> 3/2   Allergies  Allergen Reactions  . Ibuprofen Other (See Comments)    Daughter unsure of this- patient unable to comment  . Nitrofuran Derivatives Other (See Comments)    Daughter unsure of this- patient unable to comment  . Ribavirin Nausea And Vomiting    REVIEW OF SYSTEMS: Unable to perform due to critical illness, mechanically ventilated status.  SUBJECTIVE:  No overnight events She is awake, does look around, attempts to follow commands-not able to help present  VITAL SIGNS: Temp:  [98 F (36.7 C)-99.8 F (37.7 C)] 99.8 F (37.7 C) (03/06 0745) Pulse Rate:  [51-86] 84 (03/06 0830) Resp:  [14-39] 18 (03/06 0830) BP: (97-149)/(58-96) 119/92 (03/06 0830) SpO2:  [90 %-98 %] 92 % (03/06 0830) FiO2 (%):  [60 %] 60 % (03/06 0806) Weight:  [105.8 kg] 105.8 kg (03/06 0427)  VENT SETTINGS: Vent Mode: PRVC FiO2 (%):  [60 %] 60 % Set Rate:  [34 bmp] 34 bmp Vt Set:  [420 mL] 420 mL PEEP:  [12 cmH20] 12 cmH20 Plateau Pressure:  [12 cmH20-22 cmH20] 12 cmH20    PHYSICAL EXAMINATION: Middle-aged lady, RASS -1 Moist oral mucosa, endotracheal tube in place Dyssynchronous with vent.   Chest: clear breath sounds S1-S2 appreciated, no murmur Abdomen is soft, bowel sounds appreciated Extremities shows no clubbing, no edema Neurologically-sedated, opens eyes to voice, intermittently follows very simple commands . perrl    Recent Labs  Lab 05/28/19 0611 05/29/19 0840 05/30/19 0452  NA 148* 144 142  K 4.5 4.7 5.1  CL 114* 110 107  CO2 25 25 24   BUN 36* 45* 50*  CREATININE 1.36* 1.26* 1.25*  GLUCOSE 172* 144* 138*   Recent Labs  Lab 05/28/19 0611 05/28/19 0611 05/29/19 0500 05/29/19 2220 05/30/19 0452  HGB 9.6*   < > 9.9* 10.2* 10.3*  HCT  30.5*   < > 30.9* 31.5* 31.7*  WBC 16.4*  --  15.0*  --  23.0*  PLT 362  --  347  --  397   < > = values in this interval  not displayed.   DG Chest Port 1 View  Result Date: 05/30/2019 CLINICAL DATA:  Respiratory failure, COVID EXAM: PORTABLE CHEST 1 VIEW COMPARISON:  05/28/2019 FINDINGS: Endotracheal tube, enteric tube, and left PICC are again present. Endotracheal tube has been retracted and is in more optimal position. Persistent bilateral pulmonary opacities with overall similar lung aeration. No pleural effusion. No pneumothorax. Stable cardiomediastinal contours. IMPRESSION: Lines and tubes as above.  No substantial change in lung aeration. Electronically Signed   By: Macy Mis M.D.   On: 05/30/2019 06:12   [place] EKG SITE RITE  Result Date: 05/29/2019 If Site Rite image not attached, placement could not be confirmed due to current cardiac rhythm.   ASSESSMENT / PLAN:  Acute hypoxic/hypercarbic respiratory failure secondary to COVID-19 pneumonia  Improving hypoxemia, weaning FiO2/PEEP as tolerated Metabolic acidosis- resolved Hx: COPD Continue mechanical ventilation per ARDS protocol Target TVol 6-8cc/kgIBW Target Plateau Pressure < 30cm H20 Target driving pressure less than 15 cm of water Target PaO2 55-65: titrate PEEP/FiO2 per protocol Patient was proned 3/2 Recruitment maneuvers performed-tolerated well On bronchodilators Decadron Remdesivir-to complete 3/5 Ivermectin-this is discontinued at present ABG this am 7.432/36.7/53.  Will decrease RR to 28 Will monitor I/o goal negative.  Consider diuresis if not net negative on own today.  Trial of propofol today to help with comfort and  Tolerance of the ventilator (brady with precedex yesterday)   COVID-19 Pneumonia Query superimposed bacterial pneumonia Leukocytosis slightly worse today. On steroids Continue empiric cefepime/doxycycline (avoiding QT prolonging antibiotics) Cultures unremarkable to date Remdesivir (last dose 3/5) Continue Decadron Continue to trend inflammatory markers CRP 9.4>>20.1>>11.5 Elevated D-dimer: Continue full  dose anticoagulation  AKI on CKD Metabolic Acidosis: Resolved Electrolyte derangements Monitor I&O's / urinary output  trend electrolytes Avoid nephrotoxic's Replete phosphorus Hypernatremia resolved- hold free water  Shock physiology resolved Mildly elevated Troponin, likely demand ischemia Bradycardia, resolved avoiding negative chronotropic agents Hx: HTN, CHF, HLD Frequent PACs today on monitor.  Continuous cardiac monitoring  Maintain MAP >65 Volume resuscitated Vasopressors as needed to maintain MAP goal (currently on low-dose norepi) Continue to hold home antihypertensives 2D Echocardiogram 3/1: LVEF 70 to 75%.  Hyperdynamic left ventricular function, left concentric ventricular hypertrophy, diastology normal normal right ventricular function.  Normal valve function Avoid negative chronotropic agents due to bradycardia Review of [facility] shows that she has had bradycardia since 2017, etiology unknown TSH/free T4 consistent with "sick euthyroid"   Anemia of critical illness H&H 9.9/30.9 No evidence of active bleeding Transfuse for Hgb less than 7 -Continue full dose anticoagulation  Hyperglycemia CBG's ICU SSI protocol  Nutrition Tube feed protocol Constipation protocol   DISPOSITION: ICU GOALS OF CARE: Full code VTE PROPHYLAXIS: On full dose anticoagulation Family communication: Called Daughter Areale today, and updated.    The patient is critically ill with multiple organ systems failure and requires high complexity decision making for assessment and support, frequent evaluation and titration of therapies, application of advanced monitoring technologies and extensive interpretation of multiple databases. Critical Care Time devoted to patient care services described in this note independent of APP/resident time (if applicable)  is 35 minutes.    Collier Bullock MD

## 2019-05-30 NOTE — Progress Notes (Addendum)
ANTICOAGULATION CONSULT NOTE - Follow Up Consult  Pharmacy Consult for heparin Indication: r/o VTE  Labs: Recent Labs    06/05/2019 0449 05/28/19 0533 05/28/19 0611 05/28/19 2015 05/29/19 0500 05/29/19 0840 05/29/19 1457 05/29/19 2220  HGB 9.4*   < > 9.6*  --  9.9*  --   --  10.2*  HCT 29.8*   < > 30.5*  --  30.9*  --   --  31.5*  PLT 303  --  362  --  347  --   --   --   HEPARINUNFRC 0.35  --  <0.10*   < > 0.13*  --  0.27* 0.37  CREATININE 1.46*  --  1.36*  --   --  1.26*  --   --    < > = values in this interval not displayed.    Assessment/Plan:  64yo female therapeutic on heparin after rate changes. Will continue gtt at current rate and confirm stable with am labs.   Wynona Neat, PharmD, BCPS  05/30/2019,12:37 AM   Addendum: Heparin level this am now below goal at 0.15; RN reports no gtt issues though pt did have some vaginal bleeding last night, no current signs of bleeding, Hgb stable.  Will hold off on bolus given recent bleeding and increase heparin gtt by 3 units/kg/hr to 1300 units/hr and check level in 8 hours. VB 6:48 AM

## 2019-05-30 NOTE — Progress Notes (Signed)
Pharmacy Antibiotic Note  Anne [name] is a 64 y.o. female admitted to [facility] on 06/12/2019 with Covid-19 PNA and received a couple of days of antibiotics.  Now concerned with PNA and Pharmacy has been consulted for cefepime dosing.  Renal function is improving, afebrile, WBC 23.  Plan: Cefepime 2gm IV Q12H Monitor renal fxn, clinical progress  Height: 5' 2.99" (160 cm) Weight: 233 lb 4 oz (105.8 kg) IBW/kg (Calculated) : 52.38  Temp (24hrs), Avg:99.1 F (37.3 C), Min:98 F (36.7 C), Max:99.8 F (37.7 C)  Recent Labs  Lab 05/23/19 1436 05/23/19 2005 05/26/19 0431 06/04/2019 0449 05/28/19 0611 05/29/19 0500 05/29/19 0840 05/30/19 0452  WBC 9.0   < > 25.0* 16.7* 16.4* 15.0*  --  23.0*  CREATININE 5.34*   < > 1.69* 1.46* 1.36*  --  1.26* 1.25*  LATICACIDVEN 1.2  --   --   --   --   --   --   --    < > = values in this interval not displayed.    Estimated Creatinine Clearance: 54.4 mL/min (A) (by C-G formula based on SCr of 1.25 mg/dL (H)).    Allergies  Allergen Reactions  . Ibuprofen Other (See Comments)    Daughter unsure of this- patient unable to comment  . Nitrofuran Derivatives Other (See Comments)    Daughter unsure of this- patient unable to comment  . Ribavirin Nausea And Vomiting    Remdesivir 2/28>>3/4 Decadron   Cefepime 3/2 >> 3/3, restart 3/6 >> Doxy 3/2 >> 3/3  2/27 C.diff neg 2/27 BCx - negative 2/27 MRSA PCR pos   Anne [name], PharmD, BCPS, [place] 05/30/2019, 12:54 PM

## 2019-05-31 ENCOUNTER — Inpatient Hospital Stay (HOSPITAL_COMMUNITY): Payer: Medicaid Other

## 2019-05-31 LAB — POCT I-STAT 7, (LYTES, BLD GAS, ICA,H+H)
Acid-Base Excess: 2 mmol/L (ref 0.0–2.0)
Acid-Base Excess: 2 mmol/L (ref 0.0–2.0)
Bicarbonate: 26.2 mmol/L (ref 20.0–28.0)
Bicarbonate: 27.1 mmol/L (ref 20.0–28.0)
Calcium, Ion: 1.39 mmol/L (ref 1.15–1.40)
Calcium, Ion: 1.39 mmol/L (ref 1.15–1.40)
HCT: 29 % — ABNORMAL LOW (ref 36.0–46.0)
HCT: 30 % — ABNORMAL LOW (ref 36.0–46.0)
Hemoglobin: 9.9 g/dL — ABNORMAL LOW (ref 12.0–15.0)
Hemoglobin: 10.2 g/dL — ABNORMAL LOW (ref 12.0–15.0)
O2 Saturation: 85 %
O2 Saturation: 95 %
Patient temperature: 98
Patient temperature: 98
Potassium: 5.4 mmol/L — ABNORMAL HIGH (ref 3.5–5.1)
Potassium: 5.5 mmol/L — ABNORMAL HIGH (ref 3.5–5.1)
Sodium: 142 mmol/L (ref 135–145)
Sodium: 142 mmol/L (ref 135–145)
TCO2: 27 mmol/L (ref 22–32)
TCO2: 28 mmol/L (ref 22–32)
pCO2 arterial: 37.4 mmHg (ref 32.0–48.0)
pCO2 arterial: 44.5 mmHg (ref 32.0–48.0)
pH, Arterial: 7.453 — ABNORMAL HIGH (ref 7.350–7.450)
pH, Arterial: 7.39 (ref 7.350–7.450)
pO2, Arterial: 47 mmHg — ABNORMAL LOW (ref 83.0–108.0)
pO2, Arterial: 75 mmHg — ABNORMAL LOW (ref 83.0–108.0)

## 2019-05-31 LAB — BLOOD GAS, ARTERIAL
Acid-base deficit: 3.3 mmol/L — ABNORMAL HIGH (ref 0.0–2.0)
Bicarbonate: 24.3 mmol/L (ref 20.0–28.0)
FIO2: 0.9
MECHVT: 400 mL
O2 Saturation: 82.6 %
PEEP: 15 cmH2O
Patient temperature: 37
RATE: 32 resp/min
pCO2 arterial: 53 mmHg — ABNORMAL HIGH (ref 32.0–48.0)
pH, Arterial: 7.27 — ABNORMAL LOW (ref 7.350–7.450)
pO2, Arterial: 54 mmHg — ABNORMAL LOW (ref 83.0–108.0)

## 2019-05-31 LAB — CBC
HCT: 31.1 % — ABNORMAL LOW (ref 36.0–46.0)
Hemoglobin: 9.8 g/dL — ABNORMAL LOW (ref 12.0–15.0)
MCH: 29.1 pg (ref 26.0–34.0)
MCHC: 31.5 g/dL (ref 30.0–36.0)
MCV: 92.3 fL (ref 80.0–100.0)
Platelets: 348 10*3/uL (ref 150–400)
RBC: 3.37 MIL/uL — ABNORMAL LOW (ref 3.87–5.11)
RDW: 13.7 % (ref 11.5–15.5)
WBC: 22.5 10*3/uL — ABNORMAL HIGH (ref 4.0–10.5)
nRBC: 1.2 % — ABNORMAL HIGH (ref 0.0–0.2)

## 2019-05-31 LAB — COMPREHENSIVE METABOLIC PANEL
ALT: 56 U/L — ABNORMAL HIGH (ref 0–44)
AST: 50 U/L — ABNORMAL HIGH (ref 15–41)
Albumin: 2.2 g/dL — ABNORMAL LOW (ref 3.5–5.0)
Alkaline Phosphatase: 83 U/L (ref 38–126)
Anion gap: 12 (ref 5–15)
BUN: 52 mg/dL — ABNORMAL HIGH (ref 8–23)
CO2: 24 mmol/L (ref 22–32)
Calcium: 9.4 mg/dL (ref 8.9–10.3)
Chloride: 106 mmol/L (ref 98–111)
Creatinine, Ser: 1.19 mg/dL — ABNORMAL HIGH (ref 0.44–1.00)
GFR calc Af Amer: 57 mL/min — ABNORMAL LOW (ref >60)
GFR calc non Af Amer: 49 mL/min — ABNORMAL LOW (ref >60)
Glucose, Bld: 155 mg/dL — ABNORMAL HIGH (ref 70–99)
Potassium: 6 mmol/L — ABNORMAL HIGH (ref 3.5–5.1)
Sodium: 142 mmol/L (ref 135–145)
Total Bilirubin: 1 mg/dL (ref 0.3–1.2)
Total Protein: 6.2 g/dL — ABNORMAL LOW (ref 6.5–8.1)

## 2019-05-31 LAB — GLUCOSE, CAPILLARY
Glucose-Capillary: 122 mg/dL — ABNORMAL HIGH (ref 70–99)
Glucose-Capillary: 123 mg/dL — ABNORMAL HIGH (ref 70–99)
Glucose-Capillary: 139 mg/dL — ABNORMAL HIGH (ref 70–99)
Glucose-Capillary: 179 mg/dL — ABNORMAL HIGH (ref 70–99)
Glucose-Capillary: 211 mg/dL — ABNORMAL HIGH (ref 70–99)
Glucose-Capillary: 218 mg/dL — ABNORMAL HIGH (ref 70–99)

## 2019-05-31 LAB — HEPARIN LEVEL (UNFRACTIONATED)
Heparin Unfractionated: 1.24 IU/mL — ABNORMAL HIGH (ref 0.30–0.70)
Heparin Unfractionated: 1.1 IU/mL — ABNORMAL HIGH (ref 0.30–0.70)
Heparin Unfractionated: 1.36 IU/mL — ABNORMAL HIGH (ref 0.30–0.70)

## 2019-05-31 LAB — POTASSIUM: Potassium: 5.2 mmol/L — ABNORMAL HIGH (ref 3.5–5.1)

## 2019-05-31 MED ORDER — MIDAZOLAM 50MG/50ML (1MG/ML) PREMIX INFUSION
1.0000 mg/h | INTRAVENOUS | Status: DC
  Administered 2019-05-31: 4 mg/h via INTRAVENOUS
  Administered 2019-05-31: 0.5 mg/h via INTRAVENOUS
  Administered 2019-06-01: 6 mg/h via INTRAVENOUS
  Administered 2019-06-01: 2 mg/h via INTRAVENOUS
  Administered 2019-06-02: 4 mg/h via INTRAVENOUS
  Administered 2019-06-03: 2.5 mg/h via INTRAVENOUS
  Administered 2019-06-04: 1 mg/h via INTRAVENOUS
  Filled 2019-05-31 (×6): qty 50

## 2019-05-31 MED ORDER — NOREPINEPHRINE 4 MG/250ML-% IV SOLN: 0.0000 ug/min | INTRAVENOUS | Status: DC

## 2019-05-31 MED ORDER — HEPARIN (PORCINE) 25000 UT/250ML-% IV SOLN
600.0000 [IU]/h | INTRAVENOUS | Status: DC
  Administered 2019-05-31: 600 [IU]/h via INTRAVENOUS

## 2019-05-31 MED ORDER — IPRATROPIUM-ALBUTEROL 0.5-2.5 (3) MG/3ML IN SOLN
3.0000 mL | Freq: Three times a day (TID) | RESPIRATORY_TRACT | Status: DC
  Administered 2019-05-31 – 2019-06-02 (×7): 3 mL via RESPIRATORY_TRACT
  Filled 2019-05-31 (×7): qty 3

## 2019-05-31 MED ORDER — NOREPINEPHRINE 16 MG/250ML-% IV SOLN: 0.0000 ug/min | INTRAVENOUS | Status: DC

## 2019-05-31 MED ORDER — ATROPINE SULFATE 1 MG/10ML IJ SOSY
PREFILLED_SYRINGE | INTRAMUSCULAR | Status: AC
  Filled 2019-05-31: qty 10

## 2019-05-31 MED ORDER — HEPARIN (PORCINE) 25000 UT/250ML-% IV SOLN
800.0000 [IU]/h | INTRAVENOUS | Status: DC
  Administered 2019-05-31: 800 [IU]/h via INTRAVENOUS
  Filled 2019-05-31: qty 250

## 2019-05-31 NOTE — Progress Notes (Signed)
ABG results given to Gonzales, Nicole, MD 

## 2019-05-31 NOTE — Progress Notes (Signed)
ANTICOAGULATION CONSULT NOTE  Pharmacy Consult for heparin Indication: Possible pulmonary embolus  Patient Measurements: Height: 5' 2.99" (160 cm) Weight: 234 lb 12.6 oz (106.5 kg) IBW/kg (Calculated) : 52.38 Heparin Dosing Weight: 72 kg  Vital Signs: Temp: 98 F (36.7 C) (03/07 0400) Temp Source: Axillary (03/07 0400) BP: 94/60 (03/07 1100) Pulse Rate: 70 (03/07 1100)  Labs: Recent Labs    05/29/19 0500 05/29/19 0840 05/29/19 1457 05/30/19 0452 05/30/19 0453 05/30/19 1455 05/30/19 2150 05/31/19 0500 05/31/19 0500 05/31/19 0840 05/31/19 0913 05/31/19 1053 05/31/19 1146  HGB 9.9*  --    < > 10.3*   < >   < >  --  9.8*   < >  --  9.9*  --  10.2*  HCT 30.9*  --    < > 31.7*   < >   < >  --  31.1*  --   --  29.0*  --  30.0*  PLT 347  --   --  397  --   --   --  348  --   --   --   --   --   HEPARINUNFRC 0.13*  --    < >  --    < >  --  1.26*  --   --  1.24*  --  1.10*  --   CREATININE  --  1.26*  --  1.25*  --   --   --  1.19*  --   --   --   --   --    < > = values in this interval not displayed.    Estimated Creatinine Clearance: 57.3 mL/min (A) (by C-G formula based on SCr of 1.19 mg/dL (H)).   Assessment: Patient admitted x SOB found to have COVID + pneumonia was suspected to have PE but CT PE not performed d/t AKI on CKD. Patient is being placed on heparin drip for treatment of possible PE d/t elevated d-dimer.  Heparin level is supra-therapeutic (drawn from central line where heparin is infusing) and confirmatory heparin level (drawn peripherally) is also elevated.  No further bloody vaginal discharge per RN.  Goal of Therapy:  Heparin level 0.3-0.7 units/ml Monitor platelets by anticoagulation protocol: Yes   Plan:  Hold heparin x 1 hr (RN aware), then restart at 800 units/hr Check 6 hr heparin level Daily heparin level and CBC  Shailen Thielen D. Mina Marble, PharmD, BCPS, [place] 05/31/2019, 12:03 PM

## 2019-05-31 NOTE — Progress Notes (Signed)
Attempted to reach pts daughter via phone at 1230 with no answer. Pts daughter returned call. I updated her on patient condition, proning and that patient was awake and following commands this am before we had to sedate her for proning. pts daughter voices patient lost her mom and brother to [place] in January and February of this year. Also, patient 's boyfriend has died as well from [place] during patients hospitilaztion . Daughter request next time patient is awake that we video chat with her. Ellamae Sia

## 2019-05-31 NOTE — Progress Notes (Addendum)
Name: Anne [name] MRN: [id number] DOB: Jul 14, 1955    ADMISSION DATE:  06/24/2019 to [place]:  05/23/2019 at [facility]  Diagnosis: Respiratory failure, hypoxic, due to COVID-19; ventilator dependence. Transferred from [place] 3/3.   BRIEF PATIENT DESCRIPTION:  64 y.o. Female admitted 2/27 with acute hypoxic/hypercarbic respiratory failure secondary to [id number] pneumonia complicated by severe metabolic acidosis, hypotension, along with severe AKI requiring bicarb gtt. failed noninvasive ventilation, intubated now mechanically ventilated.  SIGNIFICANT EVENTS  2/27- Admission to [place] unit 2/27- Transfer to Stepdown due to hypotension, increasing FiO2 requirements, metabolic acidosis requiring Bicarb gtt 2/28- intubated, initiated mechanical ventilation. Central venous access right femoral placed 3/01- continued issues with oxygenation despite mechanical ventilation, placed in prone position 3/02- oxygenation remains tenuous, resumed supine position, recruitment maneuvers initiated, patient tolerating, initiating potential transfer to [facility] unit 1M, spoke with Dr. Kara Mead 3/03-[place] 1M unit as bed available for patient will transfer.  Oxygen requirements are decreasing. 3/03: Admitted to [place] 3/4: No overnight events 3/7 persistent hypoxemia despite diuresis.   STUDIES:  2/27- CXR>>Multifocal pneumonia   2/27- CT Renal Stone Study>> 1. Left nephrolithiasis, without obstructive uropathy. 2. Bibasilar peripheral ground-glass opacities, consistent with COVID-19 pneumonia. More focal nodular opacity in the left lower lobe could represent an area of consolidation or a true pulmonary nodule. Recommend follow-up chest CT at 3 months, after the patient is recovered from the acute illness. 3. Suspicion of cirrhosis, especially given the history of hepatitis B and C. 4.  Aortic Atherosclerosis  CULTURES: POC SARS-CoV-2 2/27>>  POSITIVE Blood culture x2 2/27>> negative Strep pneumo urinary antigen 2/27>> negative Legionella urinary antigen 2/27>> negative  ANTIBIOTICS: Cefepime >>3/2 -3/3 3/6 -- Doxycycline >> 3/2 - 3/3 3/6 -   Allergies  Allergen Reactions  . Ibuprofen Other (See Comments)    Daughter unsure of this- patient unable to comment  . Nitrofuran Derivatives Other (See Comments)    Daughter unsure of this- patient unable to comment  . Ribavirin Nausea And Vomiting    REVIEW OF SYSTEMS: Unable to perform due to critical illness, mechanically ventilated status.  SUBJECTIVE:  No overnight events She is awake, does look around, attempts to follow commands-not able to help present  VITAL SIGNS: Temp:  [98 F (36.7 C)-99 F (37.2 C)] 99 F (37.2 C) (03/07 1130) Pulse Rate:  [55-88] 68 (03/07 1630) Resp:  [14-27] 27 (03/07 1630) BP: (76-136)/(48-97) 115/70 (03/07 1630) SpO2:  [84 %-100 %] 97 % (03/07 1630) FiO2 (%):  [60 %-70 %] 60 % (03/07 1438) Weight:  [106.5 kg] 106.5 kg (03/07 0500)  VENT SETTINGS: Vent Mode: PRVC FiO2 (%):  [60 %-70 %] 60 % Set Rate:  [27 bmp-28 bmp] 27 bmp Vt Set:  [420 mL] 420 mL PEEP:  [12 cmH20] 12 cmH20 Plateau Pressure:  [16 cmH20-20 cmH20] 20 cmH20    PHYSICAL EXAMINATION: Middle-aged lady, RASS -1 Moist oral mucosa, endotracheal tube in place Dyssynchronous with vent.   Chest: clear breath sounds S1-S2 appreciated, no murmur Abdomen is soft, bowel sounds appreciated Extremities shows no clubbing, no edema Neurologically-sedated, opens eyes to voice, intermittently follows very simple commands . perrl    Recent Labs  Lab 05/29/19 0840 05/29/19 0840 05/30/19 0452 05/30/19 1023 05/31/19 0500 05/31/19 0500 05/31/19 0854 05/31/19 0913 05/31/19 1146  NA 144   < > 142   < > 142  --   --  142 142  K 4.7   < > 5.1   < >  6.0*   < > 5.2* 5.4* 5.5*  CL 110  --  107  --  106  --   --   --   --   CO2 25  --  24  --  24  --   --   --   --   BUN  45*  --  50*  --  52*  --   --   --   --   CREATININE 1.26*  --  1.25*  --  1.19*  --   --   --   --   GLUCOSE 144*  --  138*  --  155*  --   --   --   --    < > = values in this interval not displayed.   Recent Labs  Lab 05/29/19 0500 05/29/19 2220 05/30/19 0452 05/30/19 1023 05/31/19 0500 05/31/19 0913 05/31/19 1146  HGB 9.9*   < > 10.3*   < > 9.8* 9.9* 10.2*  HCT 30.9*   < > 31.7*   < > 31.1* 29.0* 30.0*  WBC 15.0*  --  23.0*  --  22.5*  --   --   PLT 347  --  397  --  348  --   --    < > = values in this interval not displayed.   DG Chest Port 1 View  Result Date: 05/31/2019 CLINICAL DATA:  Hypoxia, ET tube placement COVID+ EXAM: PORTABLE CHEST 1 VIEW COMPARISON:  Chest radiograph 07/30/2019 FINDINGS: Stable support apparatus. Unchanged cardiomediastinal contours. Worsening diffuse bilateral infiltrates with predominance in the bilateral lower lobes. No evidence of pneumothorax or significant pleural effusion. IMPRESSION: Worsening diffuse bilateral infiltrates consistent with infection. Electronically Signed   By: Audie Pinto M.D.   On: 05/31/2019 10:12   DG Chest Port 1 View  Result Date: 05/30/2019 CLINICAL DATA:  Respiratory failure, COVID EXAM: PORTABLE CHEST 1 VIEW COMPARISON:  05/28/2019 FINDINGS: Endotracheal tube, enteric tube, and left PICC are again present. Endotracheal tube has been retracted and is in more optimal position. Persistent bilateral pulmonary opacities with overall similar lung aeration. No pleural effusion. No pneumothorax. Stable cardiomediastinal contours. IMPRESSION: Lines and tubes as above.  No substantial change in lung aeration. Electronically Signed   By: Macy Mis M.D.   On: 05/30/2019 06:12    ASSESSMENT / PLAN:  Acute hypoxic/hypercarbic respiratory failure secondary to COVID-19 pneumonia  Hypoxemia persistent. Metabolic acidosis- resolved Hx: COPD Continue mechanical ventilation per ARDS protocol Target TVol 6-8cc/kgIBW Target  Plateau Pressure < 30cm H20 Target driving pressure less than 15 cm of water Target PaO2 55-65: titrate PEEP/FiO2 per protocol On bronchodilators Decadron day 9  Remdesivir completed 3/5 Did not tolerate propofol or precedex (HD instability).   Still profoundly hypoxemic.   PaO2/FIO2 <150, initiating proning, 18 proned, 6 supine.  Continues to diurese on own, likely post ATN diuresis.  Monitor I/o closely.  COVID-19 Pneumonia Query superimposed bacterial pneumonia Leukocytosis persistent. On steroids Continue empiric cefepime/doxycycline (avoiding QT prolonging antibiotics) Cultures unremarkable to date Remdesivir (last dose 3/5) Continue Decadron Elevated D-dimer: Continue full dose anticoagulation for ppx.    AKI on CKD Metabolic Acidosis: Resolved Electrolyte derangements AKI improving Was hyperkalemic today despite large UOP.  Monitor I&O's / urinary output  trend electrolytes Avoid nephrotoxic's Replete phosphorus Hypernatremia resolved- hold free water Held maintenance fluids.    Shock  resolved Mildly elevated Troponin, likely demand ischemia Bradycardia, resolved avoiding negative chronotropic agents Hx: HTN, CHF, HLD Frequent PACs today  on monitor.  Continuous cardiac monitoring  Maintain MAP >65 Off pressors this AM.   Continue to hold home antihypertensives 2D Echocardiogram 3/1: LVEF 70 to 75%.  Hyperdynamic left ventricular function, left concentric ventricular hypertrophy, diastology normal normal right ventricular function.  Normal valve function Avoid negative chronotropic agents due to bradycardia Review of [facility] records shows that she has had bradycardia since 2017, etiology unknown TSH/free T4 consistent with "sick euthyroid"   Anemia of critical illness H&H 9.9/30.9 No evidence of active bleeding Transfuse for Hgb less than 7 -Continue full dose anticoagulation  Hyperglycemia CBG's ICU SSI protocol  Nutrition Tube feed  protocol Constipation protocol   DISPOSITION: ICU GOALS OF CARE: Full code VTE PROPHYLAXIS: On full dose anticoagulation Family communication: Updated daughter Areale and answered all her questions.    The patient is critically ill with multiple organ systems failure and requires high complexity decision making for assessment and support, frequent evaluation and titration of therapies, application of advanced monitoring technologies and extensive interpretation of multiple databases. Critical Care Time devoted to patient care services described in this note independent of APP/resident time (if applicable)  is 35 minutes.    Collier Bullock MD

## 2019-05-31 NOTE — Progress Notes (Signed)
Pt currently proned. Pt head turned to right and pt sat 100% and still tolerating prone well.

## 2019-05-31 NOTE — Progress Notes (Signed)
ANTICOAGULATION CONSULT NOTE  Pharmacy Consult for heparin Indication: Possible pulmonary embolus  Patient Measurements: Height: 5' 2.99" (160 cm) Weight: 234 lb 12.6 oz (106.5 kg) IBW/kg (Calculated) : 52.38 Heparin Dosing Weight: 72 kg  Vital Signs: Temp: 98.4 F (36.9 C) (03/07 1520) Temp Source: Axillary (03/07 1520) BP: 99/61 (03/07 1900) Pulse Rate: 66 (03/07 1900)  Labs: Recent Labs    05/29/19 0500 05/29/19 0840 05/29/19 1457 05/30/19 0452 05/30/19 0453 05/30/19 2150 05/31/19 0500 05/31/19 0500 05/31/19 0840 05/31/19 0913 05/31/19 1053 05/31/19 1146 05/31/19 1900  HGB 9.9*  --    < > 10.3*   < >  --  9.8*   < >  --  9.9*  --  10.2*  --   HCT 30.9*  --    < > 31.7*   < >  --  31.1*  --   --  29.0*  --  30.0*  --   PLT 347  --   --  397  --   --  348  --   --   --   --   --   --   HEPARINUNFRC 0.13*  --    < >  --    < >   < >  --   --  1.24*  --  1.10*  --  1.36*  CREATININE  --  1.26*  --  1.25*  --   --  1.19*  --   --   --   --   --   --    < > = values in this interval not displayed.    Estimated Creatinine Clearance: 57.3 mL/min (A) (by C-G formula based on SCr of 1.19 mg/dL (H)).   Assessment: Patient admitted x SOB found to have COVID + pneumonia was suspected to have PE but CT PE not performed d/t AKI on CKD. Patient is being placed on heparin drip for treatment of possible PE d/t elevated d-dimer.  Heparin level remains supra-therapeutic. No bleeding noted.   Goal of Therapy:  Heparin level 0.3-0.7 units/ml Monitor platelets by anticoagulation protocol: Yes   Plan:  Hold heparin x ~1 hour then restart at 600 units/hr Check an 8 hr heparin level Daily heparin level and CBC  Salome Arnt, PharmD, BCPS Clinical Pharmacist Please see AMION for all pharmacy numbers 05/31/2019 8:47 PM

## 2019-05-31 NOTE — Progress Notes (Signed)
eLink Physician-Brief Progress Note Patient Name: Anne [name] DOB: 1955-05-12 MRN: [id number]   Date of Service  05/31/2019  HPI/Events of Note  Bradycardia - HR = 58. Patient is on a Fentanyl and Versed IV infusion. Both are associated with bradycardia Versed < 1% and Fentanyl 1% to 5%.  eICU Interventions  Will order: 1. Titrate Versed IV infusion up. 2. Wean Fentanyl IV infusion down.      Intervention Category Major Interventions: Arrhythmia - evaluation and management  Athene Schuhmacher Cornelia Copa 05/31/2019, 9:59 PM

## 2019-06-01 LAB — CBC
HCT: 32 % — ABNORMAL LOW (ref 36.0–46.0)
Hemoglobin: 10.1 g/dL — ABNORMAL LOW (ref 12.0–15.0)
MCH: 29.3 pg (ref 26.0–34.0)
MCHC: 31.6 g/dL (ref 30.0–36.0)
MCV: 92.8 fL (ref 80.0–100.0)
Platelets: 293 10*3/uL (ref 150–400)
RBC: 3.45 MIL/uL — ABNORMAL LOW (ref 3.87–5.11)
RDW: 13.9 % (ref 11.5–15.5)
WBC: 15 10*3/uL — ABNORMAL HIGH (ref 4.0–10.5)
nRBC: 0.7 % — ABNORMAL HIGH (ref 0.0–0.2)

## 2019-06-01 LAB — BASIC METABOLIC PANEL
Anion gap: 10 (ref 5–15)
BUN: 51 mg/dL — ABNORMAL HIGH (ref 8–23)
CO2: 24 mmol/L (ref 22–32)
Calcium: 10.1 mg/dL (ref 8.9–10.3)
Chloride: 109 mmol/L (ref 98–111)
Creatinine, Ser: 1.13 mg/dL — ABNORMAL HIGH (ref 0.44–1.00)
GFR calc Af Amer: >60 mL/min (ref >60)
GFR calc non Af Amer: 52 mL/min — ABNORMAL LOW (ref >60)
Glucose, Bld: 185 mg/dL — ABNORMAL HIGH (ref 70–99)
Potassium: 5.8 mmol/L — ABNORMAL HIGH (ref 3.5–5.1)
Sodium: 143 mmol/L (ref 135–145)

## 2019-06-01 LAB — POCT I-STAT 7, (LYTES, BLD GAS, ICA,H+H)
Acid-Base Excess: 1 mmol/L (ref 0.0–2.0)
Acid-Base Excess: 1 mmol/L (ref 0.0–2.0)
Bicarbonate: 25.5 mmol/L (ref 20.0–28.0)
Bicarbonate: 25.9 mmol/L (ref 20.0–28.0)
Calcium, Ion: 1.41 mmol/L — ABNORMAL HIGH (ref 1.15–1.40)
Calcium, Ion: 1.43 mmol/L — ABNORMAL HIGH (ref 1.15–1.40)
HCT: 48 % — ABNORMAL HIGH (ref 36.0–46.0)
HCT: 33 % — ABNORMAL LOW (ref 36.0–46.0)
Hemoglobin: 16.3 g/dL — ABNORMAL HIGH (ref 12.0–15.0)
Hemoglobin: 11.2 g/dL — ABNORMAL LOW (ref 12.0–15.0)
O2 Saturation: 97 %
O2 Saturation: 92 %
Patient temperature: 97
Potassium: 5.7 mmol/L — ABNORMAL HIGH (ref 3.5–5.1)
Potassium: 6.4 mmol/L (ref 3.5–5.1)
Sodium: 143 mmol/L (ref 135–145)
Sodium: 140 mmol/L (ref 135–145)
TCO2: 27 mmol/L (ref 22–32)
TCO2: 27 mmol/L (ref 22–32)
pCO2 arterial: 39 mmHg (ref 32.0–48.0)
pCO2 arterial: 40.4 mmHg (ref 32.0–48.0)
pH, Arterial: 7.419 (ref 7.350–7.450)
pH, Arterial: 7.416 (ref 7.350–7.450)
pO2, Arterial: 87 mmHg (ref 83.0–108.0)
pO2, Arterial: 62 mmHg — ABNORMAL LOW (ref 83.0–108.0)

## 2019-06-01 LAB — GLUCOSE, CAPILLARY
Glucose-Capillary: 176 mg/dL — ABNORMAL HIGH (ref 70–99)
Glucose-Capillary: 125 mg/dL — ABNORMAL HIGH (ref 70–99)
Glucose-Capillary: 65 mg/dL — ABNORMAL LOW (ref 70–99)
Glucose-Capillary: 137 mg/dL — ABNORMAL HIGH (ref 70–99)
Glucose-Capillary: 202 mg/dL — ABNORMAL HIGH (ref 70–99)
Glucose-Capillary: 236 mg/dL — ABNORMAL HIGH (ref 70–99)
Glucose-Capillary: 242 mg/dL — ABNORMAL HIGH (ref 70–99)

## 2019-06-01 LAB — PROTIME-INR
INR: 1.1 (ref 0.8–1.2)
Prothrombin Time: 14.2 seconds (ref 11.4–15.2)

## 2019-06-01 LAB — HEPARIN LEVEL (UNFRACTIONATED): Heparin Unfractionated: 0.46 IU/mL (ref 0.30–0.70)

## 2019-06-01 MED ORDER — QUETIAPINE FUMARATE 50 MG PO TABS
50.0000 mg | ORAL_TABLET | Freq: Two times a day (BID) | ORAL | Status: DC
  Administered 2019-06-01 – 2019-06-04 (×8): 50 mg
  Filled 2019-06-01 (×8): qty 1

## 2019-06-01 MED ORDER — CLONAZEPAM 1 MG PO TABS
1.0000 mg | ORAL_TABLET | Freq: Two times a day (BID) | ORAL | Status: DC
  Administered 2019-06-01 – 2019-06-05 (×10): 1 mg
  Administered 2019-06-06: 0.5 mg
  Filled 2019-06-01 (×10): qty 1

## 2019-06-01 MED ORDER — HEPARIN (PORCINE) 25000 UT/250ML-% IV SOLN
450.0000 [IU]/h | INTRAVENOUS | Status: DC
  Administered 2019-06-01: 600 [IU]/h via INTRAVENOUS
  Administered 2019-06-03: 450 [IU]/h via INTRAVENOUS
  Filled 2019-06-01 (×2): qty 250

## 2019-06-01 MED ORDER — DOPAMINE-DEXTROSE 3.2-5 MG/ML-% IV SOLN
0.0000 ug/kg/min | INTRAVENOUS | Status: DC
  Administered 2019-06-01: 9 ug/kg/min via INTRAVENOUS
  Administered 2019-06-01: 5 ug/kg/min via INTRAVENOUS
  Filled 2019-06-01 (×2): qty 250

## 2019-06-01 MED ORDER — HEPARIN SODIUM (PORCINE) 10000 UNIT/ML IJ SOLN
7500.0000 [IU] | Freq: Three times a day (TID) | INTRAMUSCULAR | Status: DC
  Administered 2019-06-01 (×2): 7500 [IU] via SUBCUTANEOUS
  Filled 2019-06-01 (×3): qty 1

## 2019-06-01 MED ORDER — DEXTROSE 250 MG/ML IV SOLN: 12.5000 g | Freq: Once | INTRAVENOUS | Status: DC

## 2019-06-01 MED ORDER — DEXTROSE 50 % IV SOLN
INTRAVENOUS | Status: AC
  Filled 2019-06-01: qty 50

## 2019-06-01 MED ORDER — ENOXAPARIN SODIUM 60 MG/0.6ML ~~LOC~~ SOLN: 0.5000 mg/kg | Freq: Two times a day (BID) | SUBCUTANEOUS | Status: DC

## 2019-06-01 MED ORDER — CLONAZEPAM 0.1 MG/ML ORAL SUSPENSION
1.0000 mg | Freq: Two times a day (BID) | ORAL | Status: DC
  Filled 2019-06-01: qty 10

## 2019-06-01 MED ORDER — NOREPINEPHRINE 16 MG/250ML-% IV SOLN
0.0000 ug/min | INTRAVENOUS | Status: DC
  Administered 2019-06-01: 2 ug/min via INTRAVENOUS
  Administered 2019-06-02: 8 ug/min via INTRAVENOUS
  Filled 2019-06-01: qty 250

## 2019-06-01 MED ORDER — DEXTROSE 50 % IV SOLN
25.0000 mL | Freq: Once | INTRAVENOUS | Status: AC
  Administered 2019-06-01: 25 mL via INTRAVENOUS

## 2019-06-01 NOTE — Progress Notes (Signed)
eLink Physician-Brief Progress Note Patient Name: Anne [name] DOB: Jul 03, 1955 MRN: [id number]   Date of Service  06/01/2019  HPI/Events of Note  Multiple issues: 1. Hypotension - Nurse restarting Norepinephrine IV infusion. Needs order for same. 2. L leg is cool. Pulses present,however, diminished in L leg.  eICU Interventions  Will order: 1. Restart Norepinephrine IV infusion.  2. Heparin IV infusion per pharmacy. 3.  D/C Heparin [place].  4. Arterial Duplex Study LLE.     Intervention Category Major Interventions: Hypotension - evaluation and management  Sommer,Steven Eugene 06/01/2019, 11:13 PM

## 2019-06-01 NOTE — Progress Notes (Addendum)
Nutrition Follow-up  DOCUMENTATION CODES:   Obesity unspecified  INTERVENTION:   Once medically able re-start TF:  -Vital 1.5 @ 20 ml/hr -Increase by 10 ml Q6 hours to goal rate of 40 ml/hr (960 ml) -60 ml Prostat BID  At goal rate TF provides: 1840 kcals, 125 grams protein, 733 ml free water.   NUTRITION DIAGNOSIS:   Inadequate oral intake related to acute illness as evidenced by NPO status.  Ongoing  GOAL:   Provide needs based on ASPEN/SCCM guidelines  TF held   MONITOR:   Vent status, Weight trends, Labs, TF tolerance, Skin  REASON FOR ASSESSMENT:   Consult, Ventilator Enteral/tube feeding initiation and management  ASSESSMENT:   64 yo female admitted to [facility] with admission for acute respiratory failure secondary to COVID-19 pneumonia requiring intubation [id number], metabolic acidosis, AKI. PMH includes CHF, COPD, hepatitis C  RD working remotely.  Pt proned this am, now unproned. Remains on pressor. TF held yesterday due to "feces like" vomit. Per RN, OGT to suction with 1500 ml output since last night. Will discuss getting KUB with MD. Consider Cortrak if KUB negative and vomiting persists. No BM in 3 days- regimen in place.   Admission weight: 105.7 kg  Current weight: 89 kg   Patient remains intubated on ventilator support MV: 10.2 L/min Temp (24hrs), Avg:99.2 F (37.3 C), Min:97.3 F (36.3 C), Max:103 F (39.4 C)   I/O: -3,650 ml since admit  UOP: 2,455 ml x 24 hrs  OGT: 400 mg x 24 hrs   Drips: precedex, levophed   Medications: SS novolog, lantus, MVI with minerals, miralax, senokot Labs: K 5.4 (L) Na 146 (H) Cr 2.05-trending down Phosphorus 5.0 (H)   Diet Order:   Diet Order    None      EDUCATION NEEDS:   Not appropriate for education at this time  Skin:  Skin Assessment: Reviewed RN Assessment  Last BM:  3/13  Height:   Ht Readings from Last 1 Encounters:  06/05/2019 5' 2.99" (1.6 m)    Weight:   Wt Readings  from Last 1 Encounters:  06/09/19 89 kg    Ideal Body Weight:  52.3 kg(Adjusted BW 72 kg)  BMI:  Body mass index is 34.77 kg/m.  Estimated Nutritional Needs:   Kcal:  [id number] kcal  Protein:  100-130 grams  Fluid:  >/= 1.7 L/day  Mariana Single RD, LDN Clinical Nutrition Pager listed in [place]

## 2019-06-01 NOTE — Progress Notes (Addendum)
Hypoglycemic Event  CBG: 65  Treatment: dextrose  Symptoms: none  Follow-up CBG: 137 Time:CBG Result:1300 Possible Reasons for Event: unknown; TF infusing Comments/MD notified:n/a per protocol    Marijean Niemann

## 2019-06-01 NOTE — Progress Notes (Signed)
Pt now in supine position, 70% FIO2 -SATS currently 94%. RT to cont to monitor.

## 2019-06-01 NOTE — Progress Notes (Signed)
eLink Physician-Brief Progress Note Patient Name: Anne [name] DOB: 1955/11/27 MRN: [id number]   Date of Service  06/01/2019  HPI/Events of Note  Bradycardia - Fentanyl IV infusion weaned to 50 mcg/min and Versed IV infusion weaned to 6 mg/hour  eICU Interventions  Will order: 1. Wean Versed as tolerated.  2. Dopamine IV infusion. Titrate to HR > 60.      Intervention Category Major Interventions: Arrhythmia - evaluation and management  Derian Pfost Eugene 06/01/2019, 5:21 AM

## 2019-06-01 NOTE — Progress Notes (Signed)
[facility] Progress Note Patient Name: Anne [name] DOB: 01-Oct-1955 MRN: [id number]   Date of Service  06/01/2019  HPI/Events of Note  Request for BMP in AM.   eICU Interventions  Will order BMP for 5 AM.     Intervention Category Major Interventions: Other:  Lysle Dingwall 06/01/2019, 4:34 AM

## 2019-06-01 NOTE — Progress Notes (Signed)
ANTICOAGULATION CONSULT NOTE  Pharmacy Consult for heparin Indication: Possible pulmonary embolus  Assessment: Patient admitted x SOB found to have COVID + pneumonia was suspected to have PE but CT PE not performed d/t AKI on CKD. Patient is being placed on heparin drip for treatment of possible PE d/t elevated d-dimer.  Heparin level therapeutic  Goal of Therapy:  Heparin level 0.3-0.7 units/ml Monitor platelets by anticoagulation protocol: Yes   Plan:  Continue heparin at  600 units/hr Check heparin level in 6-8 hr to confirm Daily heparin level and CBC  Thanks for allowing pharmacy to be a part of this patient's care.  Excell Seltzer, PharmD Clinical Pharmacist 06/01/2019 5:32 AM

## 2019-06-01 NOTE — Progress Notes (Signed)
Name: Nyemiah Joy MRN: [id number] DOB: 05-15-1955    ADMISSION DATE:  05/31/2019 to [place]:  05/23/2019 at [facility]  Diagnosis: Respiratory failure, hypoxic, due to COVID-19; ventilator dependence. Transferred from [place] 3/3.   BRIEF PATIENT DESCRIPTION:  64 y.o. Female admitted 2/27 with acute hypoxic/hypercarbic respiratory failure secondary to [id number] pneumonia complicated by severe metabolic acidosis, hypotension, along with severe AKI requiring bicarb gtt. failed noninvasive ventilation, intubated now mechanically ventilated.  SIGNIFICANT EVENTS  2/27- Admission to [place] unit 2/27- Transfer to Stepdown due to hypotension, increasing FiO2 requirements, metabolic acidosis requiring Bicarb gtt 2/28- intubated, initiated mechanical ventilation. Central venous access right femoral placed 3/01- continued issues with oxygenation despite mechanical ventilation, placed in prone position 3/02- oxygenation remains tenuous, resumed supine position, recruitment maneuvers initiated, patient tolerating, initiating potential transfer to [facility] unit 461M, spoke with Dr. Kara Mead 3/03-[place] 461M unit as bed available for patient will transfer.  Oxygen requirements are decreasing. 3/03: Admitted to [place] 3/4: No overnight events 3/7 persistent hypoxemia despite diuresis.   STUDIES:  2/27- CXR>>Multifocal pneumonia   2/27- CT Renal Stone Study>> 1. Left nephrolithiasis, without obstructive uropathy. 2. Bibasilar peripheral ground-glass opacities, consistent with COVID-19 pneumonia. More focal nodular opacity in the left lower lobe could represent an area of consolidation or a true pulmonary nodule. Recommend follow-up chest CT at 3 months, after the patient is recovered from the acute illness. 3. Suspicion of cirrhosis, especially given the history of hepatitis B and C. 4.  Aortic Atherosclerosis 2D Echocardiogram 3/1: LVEF 70 to 75%.   Hyperdynamic left ventricular function, left concentric ventricular hypertrophy, diastology normal normal right ventricular function.  Normal valve function    CULTURES: POC SARS-CoV-2 2/27>> POSITIVE Blood culture x2 2/27>> negative Strep pneumo urinary antigen 2/27>> negative Legionella urinary antigen 2/27>> negative  ANTIBIOTICS: Cefepime >>3/2 -3/3 3/6 -- Doxycycline >> 3/2 - 3/3 3/6 -  SUBJECTIVE:   Prone ventilation initiated last pm  VITAL SIGNS: Temp:  [97 F (36.1 C)-99 F (37.2 C)] 97.9 F (36.6 C) (03/08 0800) Pulse Rate:  [49-94] 57 (03/08 0800) Resp:  [17-28] 28 (03/08 0800) BP: (76-169)/(48-85) 104/50 (03/08 0800) SpO2:  [94 %-100 %] 96 % (03/08 0800) FiO2 (%):  [60 %-70 %] 60 % (03/08 0814) Weight:  [101.3 kg] 101.3 kg (03/08 0443)  VENT SETTINGS: Vent Mode: PRVC FiO2 (%):  [60 %-70 %] 60 % Set Rate:  [27 bmp-28 bmp] 28 bmp Vt Set:  [420 mL] 420 mL PEEP:  [12 cmH20] 12 cmH20 Plateau Pressure:  [20 cmH20-30 cmH20] 29 cmH20   PHYSICAL EXAMINATION: Middle-aged lady, RASS -2 Moist oral mucosa, endotracheal tube in place Synchronous with vent.   Chest:vesicular breath sounds to bases S1-S2 appreciated, no murmur. Normal capillary refill Abdomen is soft, bowel sounds appreciated Extremities shows no clubbing, no edema Neurologically-sedated, opens eyes to voice, winces to pain  Recent Labs  Lab 05/30/19 0452 05/30/19 1023 05/31/19 0500 05/31/19 0854 05/31/19 1146 06/01/19 0439 06/01/19 0625  NA 142   < > 142   < > 142 143 143  K 5.1   < > 6.0*   < > 5.5* 5.8* 5.7*  CL 107  --  106  --   --  109  --   CO2 24  --  24  --   --  24  --   BUN 50*  --  52*  --   --  51*  --   CREATININE 1.25*  --  1.19*  --   --  1.13*  --   GLUCOSE 138*  --  155*  --   --  185*  --    < > = values in this interval not displayed.   Recent Labs  Lab 05/30/19 0452 05/30/19 1023 05/31/19 0500 05/31/19 0913 05/31/19 1146 06/01/19 0439 06/01/19 0625  HGB  10.3*   < > 9.8*   < > 10.2* 10.1* 16.3*  HCT 31.7*   < > 31.1*   < > 30.0* 32.0* 48.0*  WBC 23.0*  --  22.5*  --   --  15.0*  --   PLT 397  --  348  --   --  293  --    < > = values in this interval not displayed.   DG Chest Port 1 View  Result Date: 05/31/2019 CLINICAL DATA:  Hypoxia, ET tube placement COVID+ EXAM: PORTABLE CHEST 1 VIEW COMPARISON:  Chest radiograph 07/30/2019 FINDINGS: Stable support apparatus. Unchanged cardiomediastinal contours. Worsening diffuse bilateral infiltrates with predominance in the bilateral lower lobes. No evidence of pneumothorax or significant pleural effusion. IMPRESSION: Worsening diffuse bilateral infiltrates consistent with infection. Electronically Signed   By: Audie Pinto M.D.   On: 05/31/2019 10:12    ASSESSMENT / PLAN:  Acute hypoxic/hypercarbic respiratory failure secondary to COVID-19 pneumonia  Hx: COPD Continue mechanical ventilation per ARDS protocol On bronchodilators Keep supine - ABG normal this morning. Continues to diurese on own, likely post ATN diuresis.    COVID-19 Pneumonia Query superimposed bacterial pneumonia Leukocytosis persistent. On steroids Continue empiric cefepime/doxycycline (avoiding QT prolonging antibiotics) Cultures unremarkable to date Remdesivir (last dose 3/5) Continue Decadron   AKI on CKD AKI improving with spontaneous diuresis  Mildly elevated Troponin, likely demand ischemia  Bradycardia, resolved avoiding negative chronotropic agents Wean  Dopamine  Avoid negative chronotropic agents due to bradycardia Review of [facility] records shows that she has had bradycardia since 2017, etiology unknown  Anemia of critical illness H&H 9.9/30.9 No evidence of active bleeding Transfuse for Hgb less than 7  Daily Goals Checklist  Pain/Anxiety/Delirium protocol (if indicated): wean fentanyl and versed. Add enteral agents. VAP protocol (if indicated): bundle in place. Respiratory support goals:  Keep supine and wean FiO2 to 0.4 as tolerated. Blood pressure target: Wean dopamine to keep MAP 65 and HR >60 DVT prophylaxis: ufh tid Nutrition Status: Nutrition Problem: Inadequate oral intake Etiology: acute illness Signs/Symptoms: NPO status Interventions: MVI, Tube feeding GI prophylaxis: famotidine Fluid status goals: clinically euvolemic, allow autoregulation. Urinary catheter: Assessment of intravascular volume Central line: PICC Glucose control: euglycemic with  SSI Mobility/therapy needs: bedrest - progressive ambulation once more awake. Antibiotic de-escalation: complete 7 days of antibiotics. Home medication reconciliation: restart atorvastatin and hold hypertension medications. Daily labs: BMP, CBC Code Status: full Family Communication: will update Disposition: ICU  The patient is critically ill with multiple organ systems failure and requires high complexity decision making for assessment and support, frequent evaluation and titration of therapies, application of advanced monitoring technologies and extensive interpretation of multiple databases. Critical Care Time devoted to patient care services described in this note independent of APP/resident time (if applicable)  is 30 minutes.    Kipp Brood MD

## 2019-06-01 NOTE — Progress Notes (Signed)
Notified E-link RN that upon initial assessment patient's Left leg found to be cooler than the right and pulses in that leg required doppler, but pulses were present with doppler. Giving scheduled SQ Heparin.

## 2019-06-01 NOTE — Progress Notes (Signed)
ANTICOAGULATION CONSULT NOTE  Pharmacy Consult for heparin Indication: Cool LLE   Assessment: Patient admitted x SOB found to have COVID.  Was started on heparin for r/o PE which was discontinued earlier today and start on sq heparin.  Now to resume heparin for cool LLE.  Heparin level therapeutic on 600 units/hr this am  Goal of Therapy:  Heparin level 0.3-0.7 units/ml Monitor platelets by anticoagulation protocol: Yes   Plan:  Restart heparin at  600 units/hr Check heparin level in 6-8 hr Daily heparin level and CBC  Thanks for allowing pharmacy to be a part of this patient's care.  Excell Seltzer, PharmD Clinical Pharmacist 06/01/2019 11:37 PM

## 2019-06-02 ENCOUNTER — Encounter (HOSPITAL_COMMUNITY): Payer: Medicaid Other

## 2019-06-02 LAB — BASIC METABOLIC PANEL
Anion gap: 12 (ref 5–15)
Anion gap: 13 (ref 5–15)
BUN: 60 mg/dL — ABNORMAL HIGH (ref 8–23)
BUN: 66 mg/dL — ABNORMAL HIGH (ref 8–23)
CO2: 27 mmol/L (ref 22–32)
CO2: 26 mmol/L (ref 22–32)
Calcium: 11.1 mg/dL — ABNORMAL HIGH (ref 8.9–10.3)
Calcium: 10.5 mg/dL — ABNORMAL HIGH (ref 8.9–10.3)
Chloride: 108 mmol/L (ref 98–111)
Chloride: 108 mmol/L (ref 98–111)
Creatinine, Ser: 1.34 mg/dL — ABNORMAL HIGH (ref 0.44–1.00)
Creatinine, Ser: 1.45 mg/dL — ABNORMAL HIGH (ref 0.44–1.00)
GFR calc Af Amer: 49 mL/min — ABNORMAL LOW (ref >60)
GFR calc Af Amer: 45 mL/min — ABNORMAL LOW (ref >60)
GFR calc non Af Amer: 42 mL/min — ABNORMAL LOW (ref >60)
GFR calc non Af Amer: 38 mL/min — ABNORMAL LOW (ref >60)
Glucose, Bld: 283 mg/dL — ABNORMAL HIGH (ref 70–99)
Glucose, Bld: 345 mg/dL — ABNORMAL HIGH (ref 70–99)
Potassium: 6.9 mmol/L (ref 3.5–5.1)
Potassium: 6.6 mmol/L (ref 3.5–5.1)
Sodium: 147 mmol/L — ABNORMAL HIGH (ref 135–145)
Sodium: 147 mmol/L — ABNORMAL HIGH (ref 135–145)

## 2019-06-02 LAB — POCT I-STAT 7, (LYTES, BLD GAS, ICA,H+H)
Bicarbonate: 24.9 mmol/L (ref 20.0–28.0)
Calcium, Ion: 1.5 mmol/L — ABNORMAL HIGH (ref 1.15–1.40)
HCT: 36 % (ref 36.0–46.0)
Hemoglobin: 12.2 g/dL (ref 12.0–15.0)
O2 Saturation: 94 %
Patient temperature: 97.9
Potassium: 6.9 mmol/L (ref 3.5–5.1)
Sodium: 141 mmol/L (ref 135–145)
TCO2: 26 mmol/L (ref 22–32)
pCO2 arterial: 40.5 mmHg (ref 32.0–48.0)
pH, Arterial: 7.396 (ref 7.350–7.450)
pO2, Arterial: 71 mmHg — ABNORMAL LOW (ref 83.0–108.0)

## 2019-06-02 LAB — CBC
HCT: 34.7 % — ABNORMAL LOW (ref 36.0–46.0)
Hemoglobin: 11.2 g/dL — ABNORMAL LOW (ref 12.0–15.0)
MCH: 29.2 pg (ref 26.0–34.0)
MCHC: 32.3 g/dL (ref 30.0–36.0)
MCV: 90.6 fL (ref 80.0–100.0)
Platelets: 334 10*3/uL (ref 150–400)
RBC: 3.83 MIL/uL — ABNORMAL LOW (ref 3.87–5.11)
RDW: 14.3 % (ref 11.5–15.5)
WBC: 18.8 10*3/uL — ABNORMAL HIGH (ref 4.0–10.5)
nRBC: 0.3 % — ABNORMAL HIGH (ref 0.0–0.2)

## 2019-06-02 LAB — GLUCOSE, CAPILLARY
Glucose-Capillary: 242 mg/dL — ABNORMAL HIGH (ref 70–99)
Glucose-Capillary: 228 mg/dL — ABNORMAL HIGH (ref 70–99)
Glucose-Capillary: 265 mg/dL — ABNORMAL HIGH (ref 70–99)
Glucose-Capillary: 288 mg/dL — ABNORMAL HIGH (ref 70–99)
Glucose-Capillary: 283 mg/dL — ABNORMAL HIGH (ref 70–99)
Glucose-Capillary: 240 mg/dL — ABNORMAL HIGH (ref 70–99)
Glucose-Capillary: 234 mg/dL — ABNORMAL HIGH (ref 70–99)
Glucose-Capillary: 236 mg/dL — ABNORMAL HIGH (ref 70–99)

## 2019-06-02 LAB — LACTIC ACID, PLASMA: Lactic Acid, Venous: 1.9 mmol/L (ref 0.5–1.9)

## 2019-06-02 LAB — HEPARIN LEVEL (UNFRACTIONATED): Heparin Unfractionated: 0.45 IU/mL (ref 0.30–0.70)

## 2019-06-02 LAB — CK: Total CK: 106 U/L (ref 38–234)

## 2019-06-02 MED ORDER — CALCIUM GLUCONATE-NACL 1-0.675 GM/50ML-% IV SOLN
1.0000 g | Freq: Once | INTRAVENOUS | Status: AC
  Administered 2019-06-02: 1000 mg via INTRAVENOUS
  Filled 2019-06-02: qty 50

## 2019-06-02 MED ORDER — DEXTROSE 50 % IV SOLN
1.0000 | Freq: Once | INTRAVENOUS | Status: AC
  Administered 2019-06-02: 04:00:00 50 mL via INTRAVENOUS
  Filled 2019-06-02: qty 50

## 2019-06-02 MED ORDER — ATORVASTATIN CALCIUM 40 MG PO TABS: 40.0000 mg | ORAL_TABLET | Freq: Every day | ORAL | Status: DC

## 2019-06-02 MED ORDER — SODIUM CHLORIDE 0.9 % IV SOLN: INTRAVENOUS | Status: DC

## 2019-06-02 MED ORDER — INSULIN ASPART 100 UNIT/ML IV SOLN
10.0000 [IU] | Freq: Once | INTRAVENOUS | Status: AC
  Administered 2019-06-02: 10 [IU] via INTRAVENOUS

## 2019-06-02 MED ORDER — IPRATROPIUM-ALBUTEROL 0.5-2.5 (3) MG/3ML IN SOLN: 3.0000 mL | RESPIRATORY_TRACT | Status: DC | PRN

## 2019-06-02 MED ORDER — NEPRO/CARBSTEADY PO LIQD
1000.0000 mL | ORAL | Status: DC
  Administered 2019-06-02 – 2019-06-08 (×7): 1000 mL
  Filled 2019-06-02 (×8): qty 1000

## 2019-06-02 MED ORDER — INSULIN ASPART 100 UNIT/ML ~~LOC~~ SOLN
0.0000 [IU] | SUBCUTANEOUS | Status: DC
  Administered 2019-06-02: 5 [IU] via SUBCUTANEOUS
  Administered 2019-06-02: 12:00:00 8 [IU] via SUBCUTANEOUS
  Administered 2019-06-02: 05:00:00 3 [IU] via SUBCUTANEOUS
  Administered 2019-06-02: 8 [IU] via SUBCUTANEOUS

## 2019-06-02 MED ORDER — DEXTROSE 50 % IV SOLN: 0.0000 mL | INTRAVENOUS | Status: DC | PRN

## 2019-06-02 MED ORDER — DEXTROSE-NACL 5-0.45 % IV SOLN: INTRAVENOUS | Status: DC

## 2019-06-02 MED ORDER — ATORVASTATIN CALCIUM 40 MG PO TABS
40.0000 mg | ORAL_TABLET | Freq: Every day | ORAL | Status: DC
  Administered 2019-06-02 – 2019-06-13 (×11): 40 mg
  Filled 2019-06-02 (×11): qty 1

## 2019-06-02 MED ORDER — SODIUM POLYSTYRENE SULFONATE 15 GM/60ML PO SUSP
30.0000 g | Freq: Once | ORAL | Status: AC
  Administered 2019-06-02: 30 g
  Filled 2019-06-02: qty 120

## 2019-06-02 MED ORDER — ASPIRIN 81 MG PO CHEW
81.0000 mg | CHEWABLE_TABLET | Freq: Every day | ORAL | Status: DC
  Administered 2019-06-02 – 2019-06-13 (×10): 81 mg
  Filled 2019-06-02 (×11): qty 1

## 2019-06-02 MED ORDER — SODIUM POLYSTYRENE SULFONATE 15 GM/60ML PO SUSP
30.0000 g | Freq: Four times a day (QID) | ORAL | Status: DC | PRN
  Administered 2019-06-02 – 2019-06-03 (×2): 30 g
  Filled 2019-06-02 (×2): qty 120

## 2019-06-02 MED ORDER — ADULT MULTIVITAMIN W/MINERALS CH
1.0000 | ORAL_TABLET | Freq: Every day | ORAL | Status: DC
  Administered 2019-06-02 – 2019-06-13 (×10): 1
  Filled 2019-06-02 (×10): qty 1

## 2019-06-02 MED ORDER — SODIUM BICARBONATE 8.4 % IV SOLN
100.0000 meq | Freq: Once | INTRAVENOUS | Status: AC
  Administered 2019-06-02: 100 meq via INTRAVENOUS
  Filled 2019-06-02: qty 100
  Filled 2019-06-02: qty 50

## 2019-06-02 MED ORDER — INSULIN REGULAR(HUMAN) IN NACL 100-0.9 UT/100ML-% IV SOLN
INTRAVENOUS | Status: DC
  Administered 2019-06-02: 5 [IU]/h via INTRAVENOUS
  Filled 2019-06-02: qty 100

## 2019-06-02 NOTE — Progress Notes (Signed)
Inpatient Diabetes Program Recommendations  AACE/ADA: New Consensus Statement on Inpatient Glycemic Control   Target Ranges:  Prepandial:   less than 140 mg/dL      Peak postprandial:   less than 180 mg/dL (1-2 hours)      Critically ill patients:  140 - 180 mg/dL   Results for LULIA, FARR (MRN [id number]) as of 06/02/2019 06:31  Ref. Range 06/01/2019 07:33 06/01/2019 11:38 06/01/2019 12:42 06/01/2019 16:00 06/01/2019 19:57 06/01/2019 23:25 06/02/2019 04:55  Glucose-Capillary Latest Ref Range: 70 - 99 mg/dL 125 (H) 65 (L) 137 (H) 202 (H) 236 (H) 242 (H) 242 (H)   Review of Glycemic Control  Diabetes history: NO Outpatient Diabetes medications: NA Current orders for Inpatient glycemic control: Novolog 0-15 units Q4H; Decadron 6 mg Q24H, Vital @ 40 ml/hr  Inpatient Diabetes Program Recommendations:    Insulin-Correction: Noted Novolog correction scale increased to Moderate scale this morning.  Insulin-Tube Feeding Coverage: Please consider ordering Novolog 4 units Q4H for tube feeding coverage. If tube feeding is stopped or held then Novolog tube feeding coverage should also be stopped or held.  Thanks, Barnie Alderman, RN, MSN, CDE Diabetes Coordinator Inpatient Diabetes Program [phone] (Team Pager from 8am to 5pm)

## 2019-06-02 NOTE — Progress Notes (Signed)
Spoke with patients son and updated.

## 2019-06-02 NOTE — Progress Notes (Signed)
NAME:  Anne [name], MRN:  [id number], DOB:  [birth year], LOS: 6 ADMISSION DATE:  05/28/2019 REFERRING MD:  Dr. Patsey Berthold, [facility], CHIEF COMPLAINT:  Short of breath   Brief History   64 yo female with hypoxia from COVID 19 pneumonia with ARDS.  Transferred from [facility] 3/03.  Past Medical History  Hep C, COPD, Diastolic CHF, HTN  Significant Hospital Events   2/27- Admission to [place] unit 2/27- Transfer to Stepdown due to hypotension, increasing FiO2 requirements, metabolic acidosis requiring Bicarb gtt 2/28- intubated, initiated mechanical ventilation. Central venous access right femoral placed 3/01- continued issues with oxygenation despite mechanical ventilation, placed in prone position 3/02- oxygenation remains tenuous, resumed supine position, recruitment maneuvers initiated, patient tolerating, initiating potential transfer to [facility] unit 90M, spoke with Dr. Kara Mead 3/03-[place] 90M unit as bed available for patient will transfer.  Oxygen requirements are decreasing. 3/03: Admitted to [place] 3/4: No overnight events 3/7 persistent hypoxemia despite diuresis.   Consults:    Procedures:  ETT 2/28 >>  Lt PICC 3/05 >>   Significant Diagnostic Tests:  2/27- CT Renal Stone Study>> 1. Left nephrolithiasis, without obstructive uropathy. 2. Bibasilar peripheral ground-glass opacities, consistent with COVID-19 pneumonia. More focal nodular opacity in the left lower lobe could represent an area of consolidation or a true pulmonary nodule. Recommend follow-up chest CT at 3 months, after the patient is recovered from the acute illness. 3. Suspicion of cirrhosis, especially given the history of hepatitis B and C. 4. Aortic Atherosclerosis 2D Echocardiogram 3/1: LVEF 70 to 75%.  Hyperdynamic left ventricular function, left concentric ventricular hypertrophy, diastology normal normal right ventricular function.  Normal valve function  Micro Data:  SARS CoV2 2/27 >>  positive  Antimicrobials:  Cefepime 3/06 >>   Interim history/subjective:  Remains on pressors.  Objective   Blood pressure 103/62, pulse 76, temperature 97.8 F (36.6 C), temperature source Oral, resp. rate (!) 28, height 5' 2.99" (1.6 m), weight 100.6 kg, SpO2 93 %.    Vent Mode: PRVC FiO2 (%):  [55 %] 55 % Set Rate:  [28 bmp] 28 bmp Vt Set:  [420 mL] 420 mL PEEP:  [12 cmH20] 12 cmH20 Plateau Pressure:  [27 cmH20-29 cmH20] 27 cmH20   Intake/Output Summary (Last 24 hours) at 06/02/2019 0939 Last data filed at 06/02/2019 0800 Gross per 24 hour  Intake 1633.59 ml  Output 4295 ml  Net -2661.41 ml   Filed Weights   05/31/19 0500 06/01/19 0443 06/02/19 0500  Weight: 106.5 kg 101.3 kg 100.6 kg    Examination:  General - sedated Eyes - pupils reactive ENT - ETT in place Cardiac - regular rate/rhythm, no murmur Chest - scattered rhonchi Abdomen - soft, non tender, + bowel sounds Extremities - 1+ edema Skin - no rashes Neuro - RASS -3   Resolved Hospital Problem list   Elevated troponin from demand ischemia  Assessment & Plan:   Acute hypoxic respiratory failure with ARDS from COVID 19 pneumonia complicated by HCAP. Hx of COPD. - f/u CXR, ABG - goal PaO2:FiO2 ratio > 150 - goal plateau pressure < 30, driving force < 15 - prn duoneb - day 4 of cefepime - continue decadron for 10 day course  Septic shock. - pressors to keep MAP > 65  AKI from ATN 2nd to hypoxia and sepsis. Hyperkalemia. - f/u BMET, ABG - if K consistently elevated, then will need renal consult  Cool lower extremity. - could be from pressors - continue heparin  gtt - check CPK  Bradycardia. - improved - wean of dopamine as tolerated  Hyperglycemia.  - SSI  Acute metabolic encephalopathy from hypoxia, sepsis. - RASS goal -2 to -3  Best practice:  Diet: tube feeds DVT prophylaxis: heparin gtt GI prophylaxis: protonix Mobility: bed rest Code Status: full code Disposition: ICU   Labs    CMP Latest Ref Rng & Units 06/02/2019 06/02/2019 06/01/2019  Glucose 70 - 99 mg/dL 316(H) - -  BUN 8 - 23 mg/dL 57(H) - -  Creatinine 0.44 - 1.00 mg/dL 1.36(H) - -  Sodium 135 - 145 mmol/L 140 141 140  Potassium 3.5 - 5.1 mmol/L 7.1(HH) 6.9(HH) 6.4(HH)  Chloride 98 - 111 mmol/L 104 - -  CO2 22 - 32 mmol/L 24 - -  Calcium 8.9 - 10.3 mg/dL 10.6(H) - -  Total Protein 6.5 - 8.1 g/dL - - -  Total Bilirubin 0.3 - 1.2 mg/dL - - -  Alkaline Phos 38 - 126 U/L - - -  AST 15 - 41 U/L - - -  ALT 0 - 44 U/L - - -    CBC Latest Ref Rng & Units 06/02/2019 06/02/2019 06/01/2019  WBC 4.0 - 10.5 K/uL 18.8(H) - -  Hemoglobin 12.0 - 15.0 g/dL 11.2(L) 12.2 11.2(L)  Hematocrit 36.0 - 46.0 % 34.7(L) 36.0 33.0(L)  Platelets 150 - 400 K/uL 334 - -    ABG    Component Value Date/Time   PHART 7.396 06/02/2019 0159   PCO2ART 40.5 06/02/2019 0159   PO2ART 71.0 (L) 06/02/2019 0159   HCO3 24.9 06/02/2019 0159   TCO2 26 06/02/2019 0159   ACIDBASEDEF 1.1 05/25/2019 0730   O2SAT 94.0 06/02/2019 0159    CBG (last 3)  Recent Labs    06/01/19 2325 06/02/19 0455 06/02/19 0742  GLUCAP 242* 242* 228*    CC time 37 minutes  Chesley Mires, MD [place] 06/02/2019, 9:52 AM

## 2019-06-02 NOTE — Progress Notes (Signed)
eLink Physician-Brief Progress Note Patient Name: Anne [name] DOB: 1955-12-14 MRN: [id number]   Date of Service  06/02/2019  HPI/Events of Note  K+ = 7.1.   eICU Interventions  Will order: 1. Kayexalate 30 gm per tube now. 2. D50 / Novolog 10 units IV now. 3. Calcium gluconate 1 gm IV now. 4. NaHCO3 100 meq IV now.  5. Repeat BMP at 9 AM.      Intervention Category Major Interventions: Electrolyte abnormality - evaluation and management  Joshuah Minella Eugene 06/02/2019, 3:11 AM

## 2019-06-02 NOTE — Progress Notes (Signed)
ANTICOAGULATION CONSULT NOTE  Pharmacy Consult for heparin Indication: Cool LLE   Assessment: 71 yof presenting with SOB, found to have COVID-19. Patient was started on heparin initially for r/o PE, then drip discontinued on 3/8 AM and transitioned to heparin SQ. Dopplers negative for DVT 2/28. Heparin resumed 3/8 PM for cool LLE with decreased pulses. Patient is not on anticoagulation PTA.  Heparin level therapeutic. CBC stable. No active bleed issues reported.  Goal of Therapy:  Heparin level 0.3-0.7 units/ml Monitor platelets by anticoagulation protocol: Yes   Plan:  Continue heparin at 600 units/hr Monitor daily heparin level and CBC, s/sx bleeding   Arturo Morton, PharmD, BCPS Please check AMION for all [place] contact numbers Clinical Pharmacist 06/02/2019 9:21 AM

## 2019-06-02 NOTE — Progress Notes (Signed)
Notified E-link of potassium level of 7.1, awaiting new orders.

## 2019-06-02 NOTE — Progress Notes (Signed)
Call to [facility] with potassium of 6.6.

## 2019-06-02 NOTE — Progress Notes (Signed)
[place] Progress Note Patient Name: Anne [name] DOB: [birth year] MRN: [id number]   Date of Service  06/02/2019  HPI/Events of Note  K+ 6.6, serum glucose 345 mg %  eICU Interventions  Kayexalate 30 gm via NG tube until K+ < 5.0, Insulin infusion.        Frederik Pear 06/02/2019, 7:43 PM

## 2019-06-02 NOTE — Progress Notes (Signed)
Assisted tele visit to patient with family member.  Justan Gaede Anderson, RN   

## 2019-06-02 NOTE — Progress Notes (Signed)
eLink Physician-Brief Progress Note Patient Name: Jnia Rasmuson DOB: 1956-02-16 MRN: [id number]   Date of Service  06/02/2019  HPI/Events of Note  Hyperglycemia - Blood glucose = 242.  eICU Interventions  Will order: 1. Change to Q 4 hour moderate Novolog SSi.     Intervention Category Major Interventions: Hyperglycemia - active titration of insulin therapy  Orazio Weller Eugene 06/02/2019, 5:09 AM

## 2019-06-02 NOTE — Progress Notes (Signed)
Called E-link to notify ABG potassium resulted 6.9, sent off AM labs.

## 2019-06-03 ENCOUNTER — Inpatient Hospital Stay (HOSPITAL_COMMUNITY): Payer: Medicaid Other

## 2019-06-03 DIAGNOSIS — J189 Pneumonia, unspecified organism: Secondary | ICD-10-CM

## 2019-06-03 DIAGNOSIS — J80 Acute respiratory distress syndrome: Secondary | ICD-10-CM

## 2019-06-03 LAB — BASIC METABOLIC PANEL
Anion gap: 11 (ref 5–15)
Anion gap: 12 (ref 5–15)
Anion gap: 12 (ref 5–15)
Anion gap: 13 (ref 5–15)
BUN: 57 mg/dL — ABNORMAL HIGH (ref 8–23)
BUN: 63 mg/dL — ABNORMAL HIGH (ref 8–23)
BUN: 68 mg/dL — ABNORMAL HIGH (ref 8–23)
BUN: 71 mg/dL — ABNORMAL HIGH (ref 8–23)
CO2: 24 mmol/L (ref 22–32)
CO2: 25 mmol/L (ref 22–32)
CO2: 27 mmol/L (ref 22–32)
CO2: 28 mmol/L (ref 22–32)
Calcium: 10.5 mg/dL — ABNORMAL HIGH (ref 8.9–10.3)
Calcium: 10.6 mg/dL — ABNORMAL HIGH (ref 8.9–10.3)
Calcium: 9.4 mg/dL (ref 8.9–10.3)
Calcium: 9.8 mg/dL (ref 8.9–10.3)
Chloride: 104 mmol/L (ref 98–111)
Chloride: 111 mmol/L (ref 98–111)
Chloride: 112 mmol/L — ABNORMAL HIGH (ref 98–111)
Chloride: 114 mmol/L — ABNORMAL HIGH (ref 98–111)
Creatinine, Ser: 1.36 mg/dL — ABNORMAL HIGH (ref 0.44–1.00)
Creatinine, Ser: 1.36 mg/dL — ABNORMAL HIGH (ref 0.44–1.00)
Creatinine, Ser: 1.38 mg/dL — ABNORMAL HIGH (ref 0.44–1.00)
Creatinine, Ser: 1.43 mg/dL — ABNORMAL HIGH (ref 0.44–1.00)
GFR calc Af Amer: 45 mL/min — ABNORMAL LOW (ref >60)
GFR calc Af Amer: 47 mL/min — ABNORMAL LOW (ref >60)
GFR calc Af Amer: 48 mL/min — ABNORMAL LOW (ref >60)
GFR calc Af Amer: 48 mL/min — ABNORMAL LOW (ref >60)
GFR calc non Af Amer: 39 mL/min — ABNORMAL LOW (ref >60)
GFR calc non Af Amer: 41 mL/min — ABNORMAL LOW (ref >60)
GFR calc non Af Amer: 42 mL/min — ABNORMAL LOW (ref >60)
GFR calc non Af Amer: 42 mL/min — ABNORMAL LOW (ref >60)
Glucose, Bld: 195 mg/dL — ABNORMAL HIGH (ref 70–99)
Glucose, Bld: 225 mg/dL — ABNORMAL HIGH (ref 70–99)
Glucose, Bld: 253 mg/dL — ABNORMAL HIGH (ref 70–99)
Glucose, Bld: 316 mg/dL — ABNORMAL HIGH (ref 70–99)
Potassium: 4.2 mmol/L (ref 3.5–5.1)
Potassium: 4.3 mmol/L (ref 3.5–5.1)
Potassium: 5.5 mmol/L — ABNORMAL HIGH (ref 3.5–5.1)
Potassium: 7.1 mmol/L (ref 3.5–5.1)
Sodium: 140 mmol/L (ref 135–145)
Sodium: 149 mmol/L — ABNORMAL HIGH (ref 135–145)
Sodium: 151 mmol/L — ABNORMAL HIGH (ref 135–145)
Sodium: 153 mmol/L — ABNORMAL HIGH (ref 135–145)

## 2019-06-03 LAB — POCT I-STAT 7, (LYTES, BLD GAS, ICA,H+H)
Acid-Base Excess: 4 mmol/L — ABNORMAL HIGH (ref 0.0–2.0)
Bicarbonate: 28.4 mmol/L — ABNORMAL HIGH (ref 20.0–28.0)
Calcium, Ion: 1.39 mmol/L (ref 1.15–1.40)
HCT: 30 % — ABNORMAL LOW (ref 36.0–46.0)
Hemoglobin: 10.2 g/dL — ABNORMAL LOW (ref 12.0–15.0)
O2 Saturation: 93 %
Patient temperature: 98.6
Potassium: 5 mmol/L (ref 3.5–5.1)
Sodium: 150 mmol/L — ABNORMAL HIGH (ref 135–145)
TCO2: 30 mmol/L (ref 22–32)
pCO2 arterial: 40.2 mmHg (ref 32.0–48.0)
pH, Arterial: 7.457 — ABNORMAL HIGH (ref 7.350–7.450)
pO2, Arterial: 63 mmHg — ABNORMAL LOW (ref 83.0–108.0)

## 2019-06-03 LAB — GLUCOSE, CAPILLARY
Glucose-Capillary: 111 mg/dL — ABNORMAL HIGH (ref 70–99)
Glucose-Capillary: 138 mg/dL — ABNORMAL HIGH (ref 70–99)
Glucose-Capillary: 152 mg/dL — ABNORMAL HIGH (ref 70–99)
Glucose-Capillary: 166 mg/dL — ABNORMAL HIGH (ref 70–99)
Glucose-Capillary: 177 mg/dL — ABNORMAL HIGH (ref 70–99)
Glucose-Capillary: 183 mg/dL — ABNORMAL HIGH (ref 70–99)
Glucose-Capillary: 183 mg/dL — ABNORMAL HIGH (ref 70–99)
Glucose-Capillary: 196 mg/dL — ABNORMAL HIGH (ref 70–99)
Glucose-Capillary: 196 mg/dL — ABNORMAL HIGH (ref 70–99)
Glucose-Capillary: 203 mg/dL — ABNORMAL HIGH (ref 70–99)
Glucose-Capillary: 212 mg/dL — ABNORMAL HIGH (ref 70–99)
Glucose-Capillary: 214 mg/dL — ABNORMAL HIGH (ref 70–99)

## 2019-06-03 LAB — COMPREHENSIVE METABOLIC PANEL
ALT: 324 U/L — ABNORMAL HIGH (ref 0–44)
AST: 390 U/L — ABNORMAL HIGH (ref 15–41)
Albumin: 2.4 g/dL — ABNORMAL LOW (ref 3.5–5.0)
Alkaline Phosphatase: 148 U/L — ABNORMAL HIGH (ref 38–126)
Anion gap: 13 (ref 5–15)
BUN: 66 mg/dL — ABNORMAL HIGH (ref 8–23)
CO2: 27 mmol/L (ref 22–32)
Calcium: 10.4 mg/dL — ABNORMAL HIGH (ref 8.9–10.3)
Chloride: 113 mmol/L — ABNORMAL HIGH (ref 98–111)
Creatinine, Ser: 1.46 mg/dL — ABNORMAL HIGH (ref 0.44–1.00)
GFR calc Af Amer: 44 mL/min — ABNORMAL LOW (ref >60)
GFR calc non Af Amer: 38 mL/min — ABNORMAL LOW (ref >60)
Glucose, Bld: 212 mg/dL — ABNORMAL HIGH (ref 70–99)
Potassium: 4.5 mmol/L (ref 3.5–5.1)
Sodium: 153 mmol/L — ABNORMAL HIGH (ref 135–145)
Total Bilirubin: 0.6 mg/dL (ref 0.3–1.2)
Total Protein: 7.2 g/dL (ref 6.5–8.1)

## 2019-06-03 LAB — CBC
HCT: 34.2 % — ABNORMAL LOW (ref 36.0–46.0)
Hemoglobin: 10.8 g/dL — ABNORMAL LOW (ref 12.0–15.0)
MCH: 29.1 pg (ref 26.0–34.0)
MCHC: 31.6 g/dL (ref 30.0–36.0)
MCV: 92.2 fL (ref 80.0–100.0)
Platelets: 301 10*3/uL (ref 150–400)
RBC: 3.71 MIL/uL — ABNORMAL LOW (ref 3.87–5.11)
RDW: 15.1 % (ref 11.5–15.5)
WBC: 18.4 10*3/uL — ABNORMAL HIGH (ref 4.0–10.5)
nRBC: 0.3 % — ABNORMAL HIGH (ref 0.0–0.2)

## 2019-06-03 LAB — HEPARIN LEVEL (UNFRACTIONATED)
Heparin Unfractionated: 0.97 IU/mL — ABNORMAL HIGH (ref 0.30–0.70)
Heparin Unfractionated: 0.84 IU/mL — ABNORMAL HIGH (ref 0.30–0.70)
Heparin Unfractionated: 0.71 IU/mL — ABNORMAL HIGH (ref 0.30–0.70)

## 2019-06-03 LAB — MAGNESIUM: Magnesium: 1.8 mg/dL (ref 1.7–2.4)

## 2019-06-03 MED ORDER — LACTATED RINGERS IV SOLN: INTRAVENOUS | Status: DC

## 2019-06-03 MED ORDER — FUROSEMIDE 10 MG/ML IJ SOLN: 20.0000 mg | Freq: Three times a day (TID) | INTRAMUSCULAR | Status: DC

## 2019-06-03 MED ORDER — FREE WATER
200.0000 mL | Freq: Four times a day (QID) | Status: DC
  Administered 2019-06-03 – 2019-06-04 (×4): 200 mL

## 2019-06-03 MED ORDER — FENTANYL CITRATE (PF) 100 MCG/2ML IJ SOLN: 50.0000 ug | Freq: Once | INTRAMUSCULAR | Status: DC

## 2019-06-03 MED ORDER — INSULIN ASPART 100 UNIT/ML ~~LOC~~ SOLN
0.0000 [IU] | SUBCUTANEOUS | Status: DC
  Administered 2019-06-03: 2 [IU] via SUBCUTANEOUS
  Administered 2019-06-03: 3 [IU] via SUBCUTANEOUS
  Administered 2019-06-03 (×2): 5 [IU] via SUBCUTANEOUS
  Administered 2019-06-04 (×2): 2 [IU] via SUBCUTANEOUS
  Administered 2019-06-05 (×2): 3 [IU] via SUBCUTANEOUS
  Administered 2019-06-06 – 2019-06-09 (×10): 2 [IU] via SUBCUTANEOUS
  Administered 2019-06-09 – 2019-06-10 (×2): 3 [IU] via SUBCUTANEOUS
  Administered 2019-06-11: 16:00:00 2 [IU] via SUBCUTANEOUS
  Administered 2019-06-12: 3 [IU] via SUBCUTANEOUS
  Administered 2019-06-12 (×3): 2 [IU] via SUBCUTANEOUS
  Administered 2019-06-12: 3 [IU] via SUBCUTANEOUS
  Administered 2019-06-12 – 2019-06-13 (×4): 2 [IU] via SUBCUTANEOUS

## 2019-06-03 MED ORDER — FENTANYL BOLUS VIA INFUSION
25.0000 ug | INTRAVENOUS | Status: DC | PRN
  Administered 2019-06-03 – 2019-06-09 (×28): 25 ug via INTRAVENOUS
  Filled 2019-06-03: qty 25

## 2019-06-03 MED ORDER — FENTANYL 2500MCG IN NS 250ML (10MCG/ML) PREMIX INFUSION
25.0000 ug/h | INTRAVENOUS | Status: DC
  Administered 2019-06-03: 25 ug/h via INTRAVENOUS
  Administered 2019-06-05: 75 ug/h via INTRAVENOUS
  Administered 2019-06-06: 100 ug/h via INTRAVENOUS
  Administered 2019-06-06 – 2019-06-07 (×2): 150 ug/h via INTRAVENOUS
  Administered 2019-06-08: 100 ug/h via INTRAVENOUS
  Administered 2019-06-09 – 2019-06-11 (×4): 125 ug/h via INTRAVENOUS
  Administered 2019-06-12 – 2019-06-13 (×2): 175 ug/h via INTRAVENOUS
  Administered 2019-06-13: 200 ug/h via INTRAVENOUS
  Filled 2019-06-03 (×13): qty 250

## 2019-06-03 MED ORDER — FENTANYL CITRATE (PF) 100 MCG/2ML IJ SOLN: 25.0000 ug | Freq: Once | INTRAMUSCULAR | Status: AC

## 2019-06-03 MED ORDER — INSULIN GLARGINE 100 UNIT/ML ~~LOC~~ SOLN
7.0000 [IU] | SUBCUTANEOUS | Status: DC
  Administered 2019-06-03 – 2019-06-13 (×7): 7 [IU] via SUBCUTANEOUS
  Filled 2019-06-03 (×12): qty 0.07

## 2019-06-03 MED ORDER — FENTANYL CITRATE (PF) 100 MCG/2ML IJ SOLN
INTRAMUSCULAR | Status: AC
  Administered 2019-06-03: 25 ug
  Filled 2019-06-03: qty 2

## 2019-06-03 NOTE — Progress Notes (Signed)
ANTICOAGULATION CONSULT NOTE  Pharmacy Consult for heparin Indication: Cool LLE   Assessment: 47 yof presenting with SOB, found to have COVID-19. Patient was started on heparin initially for r/o PE, then drip discontinued on 3/8 AM and transitioned to heparin SQ. Dopplers negative for DVT 2/28. Heparin resumed 3/8 PM for cool LLE with decreased pulses. Patient is not on anticoagulation PTA.  Heparin level high this morning, and stat repeat level was drawn to confirm. Heparin level remains elevated at 0.84, drawn appropriately per discussion with RN.  Heparin drip dropped to 500 uts/hr and follow up Heparin level remains elevated 0.7 - again confirmed with RN - drawn correctly  CBC stable. No bleeding or issues with infusion per discussion with RN.  Goal of Therapy:  Heparin level 0.3-0.7 units/ml Monitor platelets by anticoagulation protocol: Yes   Plan:  Reduce heparin to 450 units/hr Monitor daily heparin level and CBC, s/sx bleeding   Bonnita Nasuti Pharm.D. CPP, BCPS Clinical Pharmacist [phone] 06/03/2019 5:53 PM    Please check AMION for all [place] contact numbers Clinical Pharmacist 06/03/2019 5:25 PM

## 2019-06-03 NOTE — Progress Notes (Signed)
NAME:  Anne [name], MRN:  [id number], DOB:  Nov 30, 1955, LOS: 7 ADMISSION DATE:  06/19/2019 REFERRING MD:  Dr. Patsey Berthold, [facility], CHIEF COMPLAINT:  Short of breath   Brief History   64 yo female with hypoxia from COVID 19 pneumonia with ARDS.  Transferred from [facility] 3/03.  Past Medical History  Hep C, COPD, Diastolic CHF, HTN  Significant Hospital Events   2/27- Admission to [place] unit 2/27- Transfer to Stepdown due to hypotension, increasing FiO2 requirements, metabolic acidosis requiring Bicarb gtt 2/28- intubated, initiated mechanical ventilation. Central venous access right femoral placed 3/01- continued issues with oxygenation despite mechanical ventilation, placed in prone position 3/02- oxygenation remains tenuous, resumed supine position, recruitment maneuvers initiated, patient tolerating, initiating potential transfer to [facility] unit 1M, spoke with Dr. Kara Mead 3/03-[place] 1M unit as bed available for patient will transfer.  Oxygen requirements are decreasing. 3/03: Admitted to [place] 3/4: No overnight events 3/7 persistent hypoxemia despite diuresis.   Consults:    Procedures:  ETT 2/28 >>  Lt PICC 3/05 >>   Significant Diagnostic Tests:  2/27- CT Renal Stone Study>> 1. Left nephrolithiasis, without obstructive uropathy. 2. Bibasilar peripheral ground-glass opacities, consistent with COVID-19 pneumonia. More focal nodular opacity in the left lower lobe could represent an area of consolidation or a true pulmonary nodule. Recommend follow-up chest CT at 3 months, after the patient is recovered from the acute illness. 3. Suspicion of cirrhosis, especially given the history of hepatitis B and C. 4. Aortic Atherosclerosis 2D Echocardiogram 3/1: LVEF 70 to 75%.  Hyperdynamic left ventricular function, left concentric ventricular hypertrophy, diastology normal normal right ventricular function.  Normal valve function  Micro Data:  SARS CoV2 2/27 >>  positive  Antimicrobials:  Cefepime 3/06 >  Interim history/subjective:  Off pressors today. Periods of agitation despite versed leading to desaturations to the 70s.   Objective   Blood pressure 113/78, pulse 94, temperature 98.1 F (36.7 C), temperature source Oral, resp. rate (!) 29, height 5' 2.99" (1.6 m), weight 98.9 kg, SpO2 93 %.    Vent Mode: PRVC FiO2 (%):  [50 %-55 %] 55 % Set Rate:  [28 bmp] 28 bmp Vt Set:  [420 mL] 420 mL PEEP:  [10 cmH20-12 cmH20] 10 cmH20 Plateau Pressure:  [25 cmH20-30 cmH20] 30 cmH20   Intake/Output Summary (Last 24 hours) at 06/03/2019 1016 Last data filed at 06/03/2019 0900 Gross per 24 hour  Intake 2185.89 ml  Output 3850 ml  Net -1664.11 ml   Filed Weights   06/01/19 0443 06/02/19 0500 06/03/19 0500  Weight: 101.3 kg 100.6 kg 98.9 kg    Examination:  General - obese female middle aged on vent Eyes - PERRL ENT - ETT in place, no JVD Cardiac - RRR, no MRG Chest - scattered rhonchi Abdomen - Soft, non-tender, normoactive BS Extremities - Trace edema Skin - no rashes Neuro - RASS -3   Resolved Hospital Problem list   Elevated troponin from demand ischemia, Bradycardia.  Assessment & Plan:   Acute hypoxic respiratory failure with ARDS from COVID 19 pneumonia complicated by HCAP. Hx of COPD. - f/u CXR, ABG - goal PaO2:FiO2 ratio > 150 - goal plateau pressure < 30, driving force < 15 - prn duoneb - day 5 of cefepime. Plan for 7 days. - continue decadron for 10 day course - Down to 50% and 10 PEEP today.   Septic shock. HCAP/COVID - pressors now off. MAP goal 65 mmHg  AKI from ATN  2nd to hypoxia and sepsis. Hyperkalemia - f/u BMET, ABG - K down to 4.5 this morning. Responded to Kayexalate.  Hypernatremia Hyperchloremia - Start free water flushes - Hesitant to diurese. Not sure she is truly volume overloaded. She is negative 2L for admission and LV was hyperdynamic on echo.   Cool lower extremities.  - Now off pressors,  extremities remain cool. Strong distal pulses.  - continue heparin gtt, low threshold to DC  Hyperglycemia.  - Off insulin infusion overnight.  - CBG monitoring and SSI  Acute metabolic encephalopathy from hypoxia, sepsis. - RASS goal -2 to -3 - Versed infusion - Will add fentanyl infusion: periods of agitation causing desats into 70s.   Best practice:  Diet: tube feeds DVT prophylaxis: heparin gtt GI prophylaxis: protonix Mobility: bed rest Code Status: full code Family: Daughter updated via phone 3/10 Disposition: ICU  Labs    CMP Latest Ref Rng & Units 06/03/2019 06/03/2019 06/03/2019  Glucose 70 - 99 mg/dL 212(H) - 253(H)  BUN 8 - 23 mg/dL 66(H) - 68(H)  Creatinine 0.44 - 1.00 mg/dL 1.46(H) - 1.43(H)  Sodium 135 - 145 mmol/L 153(H) 150(H) 151(H)  Potassium 3.5 - 5.1 mmol/L 4.5 5.0 5.5(H)  Chloride 98 - 111 mmol/L 113(H) - 111  CO2 22 - 32 mmol/L 27 - 27  Calcium 8.9 - 10.3 mg/dL 10.4(H) - 10.5(H)  Total Protein 6.5 - 8.1 g/dL 7.2 - -  Total Bilirubin 0.3 - 1.2 mg/dL 0.6 - -  Alkaline Phos 38 - 126 U/L 148(H) - -  AST 15 - 41 U/L 390(H) - -  ALT 0 - 44 U/L 324(H) - -    CBC Latest Ref Rng & Units 06/03/2019 06/03/2019 06/02/2019  WBC 4.0 - 10.5 K/uL 18.4(H) - 18.8(H)  Hemoglobin 12.0 - 15.0 g/dL 10.8(L) 10.2(L) 11.2(L)  Hematocrit 36.0 - 46.0 % 34.2(L) 30.0(L) 34.7(L)  Platelets 150 - 400 K/uL 301 - 334    ABG    Component Value Date/Time   PHART 7.457 (H) 06/03/2019 0225   PCO2ART 40.2 06/03/2019 0225   PO2ART 63.0 (L) 06/03/2019 0225   HCO3 28.4 (H) 06/03/2019 0225   TCO2 30 06/03/2019 0225   ACIDBASEDEF 1.1 06/24/2019 0730   O2SAT 93.0 06/03/2019 0225    CBG (last 3)  Recent Labs    06/03/19 0542 06/03/19 0646 06/03/19 0946  GLUCAP 177* 152* 111*    Critical care time 35 minutes   Georgann Housekeeper, AGACNP-BC [place]  See Amion for personal pager PCCM on call pager [phone]  06/03/2019 10:28 AM

## 2019-06-03 NOTE — Progress Notes (Signed)
Call to [facility], as Endo tool recommending SQ insulin, spoke to RN will talk to MD.

## 2019-06-03 NOTE — Progress Notes (Signed)
ANTICOAGULATION CONSULT NOTE  Pharmacy Consult for heparin Indication: Cool LLE   Assessment: 89 yof presenting with SOB, found to have COVID-19. Patient was started on heparin initially for r/o PE, then drip discontinued on 3/8 AM and transitioned to heparin SQ. Dopplers negative for DVT 2/28. Heparin resumed 3/8 PM for cool LLE with decreased pulses. Patient is not on anticoagulation PTA.  Heparin level high this morning, and stat repeat level was drawn to confirm. Heparin level remains elevated at 0.84, drawn appropriately per discussion with RN. CBC stable. No bleeding or issues with infusion per discussion with RN.  Goal of Therapy:  Heparin level 0.3-0.7 units/ml Monitor platelets by anticoagulation protocol: Yes   Plan:  Reduce heparin to 500 units/hr Check 6hr heparin level Monitor daily heparin level and CBC, s/sx bleeding   Arturo Morton, PharmD, BCPS Please check AMION for all [place] contact numbers Clinical Pharmacist 06/03/2019 9:15 AM

## 2019-06-03 NOTE — Progress Notes (Signed)
eLink Physician-Brief Progress Note Patient Name: Anne [name] DOB: [birth year] MRN: [id number]   Date of Service  06/03/2019  HPI/Events of Note  Nursing reports HR going from 60's to 80's. Bedside monitor with NSR with PAC's.  eICU Interventions  Will order: 1. 12 Lead EKG STAT. 2. BMP and Mg++ level STAT.     Intervention Category Major Interventions: Arrhythmia - evaluation and management  Charlottie Peragine [place] 06/03/2019, 10:00 PM

## 2019-06-03 NOTE — Progress Notes (Signed)
Patient urinated aprox 100 ml with purewick, bladder scanned for 250-300 ml, will continue to monitor.

## 2019-06-03 NOTE — Progress Notes (Signed)
eLink Physician-Brief Progress Note Patient Name: Anne [name] DOB: Jan 24, 1956 MRN: [id number]   Date of Service  06/03/2019  HPI/Events of Note  Pt meets criteria for transition from Insulin infusion  eICU Interventions  Insulin transition orders entered.        Kerry Kass [name] 06/03/2019, 6:20 AM

## 2019-06-04 DIAGNOSIS — U071 COVID-19: Secondary | ICD-10-CM

## 2019-06-04 DIAGNOSIS — N179 Acute kidney failure, unspecified: Secondary | ICD-10-CM

## 2019-06-04 DIAGNOSIS — J9601 Acute respiratory failure with hypoxia: Secondary | ICD-10-CM

## 2019-06-04 LAB — CBC
HCT: 29.3 % — ABNORMAL LOW (ref 36.0–46.0)
Hemoglobin: 9.2 g/dL — ABNORMAL LOW (ref 12.0–15.0)
MCH: 29.5 pg (ref 26.0–34.0)
MCHC: 31.4 g/dL (ref 30.0–36.0)
MCV: 93.9 fL (ref 80.0–100.0)
Platelets: 207 10*3/uL (ref 150–400)
RBC: 3.12 MIL/uL — ABNORMAL LOW (ref 3.87–5.11)
RDW: 15.4 % (ref 11.5–15.5)
WBC: 16.5 10*3/uL — ABNORMAL HIGH (ref 4.0–10.5)
nRBC: 0.2 % (ref 0.0–0.2)

## 2019-06-04 LAB — BASIC METABOLIC PANEL
Anion gap: 10 (ref 5–15)
BUN: 70 mg/dL — ABNORMAL HIGH (ref 8–23)
CO2: 29 mmol/L (ref 22–32)
Calcium: 10.2 mg/dL (ref 8.9–10.3)
Chloride: 115 mmol/L — ABNORMAL HIGH (ref 98–111)
Creatinine, Ser: 1.39 mg/dL — ABNORMAL HIGH (ref 0.44–1.00)
GFR calc Af Amer: 47 mL/min — ABNORMAL LOW (ref >60)
GFR calc non Af Amer: 41 mL/min — ABNORMAL LOW (ref >60)
Glucose, Bld: 128 mg/dL — ABNORMAL HIGH (ref 70–99)
Potassium: 4 mmol/L (ref 3.5–5.1)
Sodium: 154 mmol/L — ABNORMAL HIGH (ref 135–145)

## 2019-06-04 LAB — POCT I-STAT 7, (LYTES, BLD GAS, ICA,H+H)
Acid-Base Excess: 9 mmol/L — ABNORMAL HIGH (ref 0.0–2.0)
Bicarbonate: 32.8 mmol/L — ABNORMAL HIGH (ref 20.0–28.0)
Calcium, Ion: 1.4 mmol/L (ref 1.15–1.40)
HCT: 30 % — ABNORMAL LOW (ref 36.0–46.0)
Hemoglobin: 10.2 g/dL — ABNORMAL LOW (ref 12.0–15.0)
O2 Saturation: 95 %
Patient temperature: 98.6
Potassium: 4 mmol/L (ref 3.5–5.1)
Sodium: 152 mmol/L — ABNORMAL HIGH (ref 135–145)
TCO2: 34 mmol/L — ABNORMAL HIGH (ref 22–32)
pCO2 arterial: 39.9 mmHg (ref 32.0–48.0)
pH, Arterial: 7.522 — ABNORMAL HIGH (ref 7.350–7.450)
pO2, Arterial: 68 mmHg — ABNORMAL LOW (ref 83.0–108.0)

## 2019-06-04 LAB — GLUCOSE, CAPILLARY
Glucose-Capillary: 103 mg/dL — ABNORMAL HIGH (ref 70–99)
Glucose-Capillary: 75 mg/dL (ref 70–99)
Glucose-Capillary: 109 mg/dL — ABNORMAL HIGH (ref 70–99)
Glucose-Capillary: 150 mg/dL — ABNORMAL HIGH (ref 70–99)
Glucose-Capillary: 144 mg/dL — ABNORMAL HIGH (ref 70–99)
Glucose-Capillary: 104 mg/dL — ABNORMAL HIGH (ref 70–99)

## 2019-06-04 LAB — HEPARIN LEVEL (UNFRACTIONATED): Heparin Unfractionated: 0.48 IU/mL (ref 0.30–0.70)

## 2019-06-04 LAB — PHOSPHORUS: Phosphorus: 3.4 mg/dL (ref 2.5–4.6)

## 2019-06-04 LAB — MAGNESIUM: Magnesium: 1.9 mg/dL (ref 1.7–2.4)

## 2019-06-04 MED ORDER — HEPARIN SODIUM (PORCINE) 5000 UNIT/ML IJ SOLN
5000.0000 [IU] | Freq: Three times a day (TID) | INTRAMUSCULAR | Status: DC
  Administered 2019-06-04 – 2019-06-08 (×12): 5000 [IU] via SUBCUTANEOUS
  Filled 2019-06-04 (×12): qty 1

## 2019-06-04 MED ORDER — FREE WATER
200.0000 mL | Status: DC
  Administered 2019-06-04 – 2019-06-05 (×13): 200 mL

## 2019-06-04 MED ORDER — MIDAZOLAM HCL 2 MG/2ML IJ SOLN
1.0000 mg | INTRAMUSCULAR | Status: DC | PRN
  Administered 2019-06-04 – 2019-06-07 (×4): 1 mg via INTRAVENOUS
  Filled 2019-06-04 (×3): qty 2

## 2019-06-04 MED ORDER — FUROSEMIDE 10 MG/ML IJ SOLN
40.0000 mg | Freq: Once | INTRAMUSCULAR | Status: AC
  Administered 2019-06-04: 40 mg via INTRAVENOUS
  Filled 2019-06-04: qty 4

## 2019-06-04 MED ORDER — POTASSIUM CHLORIDE 20 MEQ/15ML (10%) PO SOLN
40.0000 meq | Freq: Once | ORAL | Status: AC
  Administered 2019-06-04: 40 meq
  Filled 2019-06-04: qty 30

## 2019-06-04 MED ORDER — MIDAZOLAM HCL 2 MG/2ML IJ SOLN
1.0000 mg | INTRAMUSCULAR | Status: AC | PRN
  Administered 2019-06-05 – 2019-06-07 (×3): 1 mg via INTRAVENOUS
  Filled 2019-06-04 (×3): qty 2

## 2019-06-04 NOTE — Progress Notes (Addendum)
NAME:  Anne [name], MRN:  [id number], DOB:  [birth year], LOS: 37 ADMISSION DATE:  06/20/2019 REFERRING MD:  Dr. Patsey Berthold, [facility], CHIEF COMPLAINT:  Short of breath   Brief History   64 yo female with hypoxia from COVID 19 pneumonia with ARDS.  Transferred from [facility] 3/03.  Past Medical History  Hep C, COPD, Diastolic CHF, HTN  Significant Hospital Events   2/27- Admission to [place] unit 2/27- Transfer to Stepdown due to hypotension, increasing FiO2 requirements, metabolic acidosis requiring Bicarb gtt 2/28- intubated, initiated mechanical ventilation. Central venous access right femoral placed 3/01- continued issues with oxygenation despite mechanical ventilation, placed in prone position 3/02- oxygenation remains tenuous, resumed supine position, recruitment maneuvers initiated, patient tolerating, initiating potential transfer to [facility] unit 15M, spoke with Dr. Kara Mead 3/03-[place] 15M unit as bed available for patient will transfer.  Oxygen requirements are decreasing. 3/03: Admitted to [facility] 3/4: No overnight events 3/7 persistent hypoxemia despite diuresis.   Consults:    Procedures:  ETT 2/28 >>  Lt PICC 3/05 >>   Significant Diagnostic Tests:  2/27- CT Renal Stone Study>> 1. Left nephrolithiasis, without obstructive uropathy. 2. Bibasilar peripheral ground-glass opacities, consistent with COVID-19 pneumonia. More focal nodular opacity in the left lower lobe could represent an area of consolidation or a true pulmonary nodule. Recommend follow-up chest CT at 3 months, after the patient is recovered from the acute illness. 3. Suspicion of cirrhosis, especially given the history of hepatitis B and C. 4. Aortic Atherosclerosis 2D Echocardiogram 3/1: LVEF 70 to 75%.  Hyperdynamic left ventricular function, left concentric ventricular hypertrophy, diastology normal normal right ventricular function.  Normal valve function  Micro Data:  SARS CoV2 2/27 >>  positive  Antimicrobials:  Cefepime 3/06 >  Interim history/subjective:  Weaning this morning. 10/10 PSV  Objective   Blood pressure (!) 150/98, pulse 78, temperature 98.6 F (37 C), temperature source Oral, resp. rate (!) 22, height 5' 2.99" (1.6 m), weight 98.6 kg, SpO2 94 %.    Vent Mode: PSV;CPAP FiO2 (%):  [40 %-50 %] 50 % Set Rate:  [28 bmp] 28 bmp Vt Set:  [420 mL] 420 mL PEEP:  [8 cmH20-10 cmH20] 10 cmH20 Pressure Support:  [10 cmH20] 10 cmH20 Plateau Pressure:  [22 cmH20-25 cmH20] 22 cmH20   Intake/Output Summary (Last 24 hours) at 06/04/2019 1018 Last data filed at 06/04/2019 0900 Gross per 24 hour  Intake 3014.5 ml  Output 2620 ml  Net 394.5 ml   Filed Weights   06/02/19 0500 06/03/19 0500 06/04/19 0500  Weight: 100.6 kg 98.9 kg 98.6 kg    Examination:  General - obese female on vent Eyes - PERRL ENT - ETT, no JVD Cardiac - RRR, no MRG Chest - scattered rhonchi Abdomen - Soft, non-tender, normoactive BS Extremities - Trace edema. Warm, 2+ distal pulses Skin - no rashes Neuro - RASS -3   Resolved Hospital Problem list   Elevated troponin from demand ischemia, Bradycardia, Septic shock (HCAP/COVID),    Assessment & Plan:   Acute hypoxic respiratory failure with ARDS from COVID 19 pneumonia complicated by HCAP. Hx of COPD. - f/u CXR, ABG - goal PaO2:FiO2 ratio > 150 - goal plateau pressure < 30, driving pressure < 15 - prn duoneb - day 6 of cefepime. Plan for 7 days. - PSV as tolerated. 10/8 this morning.  - continue decadron for 10 day course  AKI from ATN 2nd to hypoxia and sepsis. Hyperkalemia - f/u BMET, ABG -  K down to 4  Hypernatremia Hyperchloremia - Increase free water flushes - Diurese today  Cool lower extremities.  - Now off pressors, extremities remain cool. Strong distal pulses.  - DC heparin infusion  Hyperglycemia.  - CBG monitoring and SSI  Acute metabolic encephalopathy from hypoxia, sepsis. - RASS goal -2 to -3 -  Versed infusion wean to off - Fentanyl infusion for RASS goal - PRN versed - Cannot tolerate propofol or precedex (bradycardia)  Best practice:  Diet: Tube feeds DVT prophylaxis: Heparin gtt GI prophylaxis: Protonix Mobility: bed rest Code Status: full code Family: Daughter updated via phone 3/11 Disposition: ICU  Labs    CMP Latest Ref Rng & Units 06/04/2019 06/04/2019 06/03/2019  Glucose 70 - 99 mg/dL 128(H) - 195(H)  BUN 8 - 23 mg/dL 70(H) - 71(H)  Creatinine 0.44 - 1.00 mg/dL 1.39(H) - 1.38(H)  Sodium 135 - 145 mmol/L 154(H) 152(H) 153(H)  Potassium 3.5 - 5.1 mmol/L 4.0 4.0 4.2  Chloride 98 - 111 mmol/L 115(H) - 114(H)  CO2 22 - 32 mmol/L 29 - 28  Calcium 8.9 - 10.3 mg/dL 10.2 - 9.8  Total Protein 6.5 - 8.1 g/dL - - -  Total Bilirubin 0.3 - 1.2 mg/dL - - -  Alkaline Phos 38 - 126 U/L - - -  AST 15 - 41 U/L - - -  ALT 0 - 44 U/L - - -    CBC Latest Ref Rng & Units 06/04/2019 06/04/2019 06/03/2019  WBC 4.0 - 10.5 K/uL 16.5(H) - 18.4(H)  Hemoglobin 12.0 - 15.0 g/dL 9.2(L) 10.2(L) 10.8(L)  Hematocrit 36.0 - 46.0 % 29.3(L) 30.0(L) 34.2(L)  Platelets 150 - 400 K/uL 207 - 301    ABG    Component Value Date/Time   PHART 7.522 (H) 06/04/2019 0138   PCO2ART 39.9 06/04/2019 0138   PO2ART 68.0 (L) 06/04/2019 0138   HCO3 32.8 (H) 06/04/2019 0138   TCO2 34 (H) 06/04/2019 0138   ACIDBASEDEF 1.1 06/12/2019 0730   O2SAT 95.0 06/04/2019 0138    CBG (last 3)  Recent Labs    06/03/19 2326 06/04/19 0322 06/04/19 0725  GLUCAP 138* 103* 75    Critical care time 35 minutes   Georgann Housekeeper, AGACNP-BC [place] for personal pager PCCM on call pager [phone]  06/04/2019 10:18 AM

## 2019-06-04 NOTE — Progress Notes (Signed)
ANTICOAGULATION CONSULT NOTE  Pharmacy Consult for heparin Indication: Cool LLE   Assessment: 5 yof presenting with SOB, found to have COVID-19. Patient was started on heparin initially for r/o PE, then drip discontinued on 3/8 AM and transitioned to heparin SQ. Dopplers negative for DVT 2/28. Heparin resumed 3/8 PM for cool LLE with decreased pulses. Patient is not on anticoagulation PTA.  Heparin level this morning is therapeutic at 0.48.  No bleeding or issues with infusion  Hgb down to 9.2 and plts at 207.  Goal of Therapy:  Heparin level 0.3-0.7 units/ml Monitor platelets by anticoagulation protocol: Yes   Plan:  Continue heparin at 450 units/hr Monitor daily heparin level and CBC, s/sx bleeding   Alanda Slim, PharmD, [facility] Clinical Pharmacist Please see AMION for all Pharmacists' Contact Phone Numbers 06/04/2019, 8:01 AM

## 2019-06-04 NOTE — Progress Notes (Signed)
Per Dr.Sommer straight cath for bladder scan greater then 550 ml.

## 2019-06-04 NOTE — Progress Notes (Signed)
PCCM INTERVAL PROGRESS NOTE  With sedation off the patient is agitated with eyes open and not following commands. She flails her arms and seems to have good strength bilaterally. Tolerating 10/8 PSV, but mental status does not foster further weaning.     Georgann Housekeeper, AGACNP-BC [place]  See Amion for personal pager PCCM on call pager [phone]  06/04/2019 11:55 AM

## 2019-06-05 ENCOUNTER — Inpatient Hospital Stay (HOSPITAL_COMMUNITY): Payer: Medicaid Other

## 2019-06-05 LAB — POCT I-STAT 7, (LYTES, BLD GAS, ICA,H+H)
Acid-Base Excess: 3 mmol/L — ABNORMAL HIGH (ref 0.0–2.0)
Bicarbonate: 25.9 mmol/L (ref 20.0–28.0)
Calcium, Ion: 1.37 mmol/L (ref 1.15–1.40)
HCT: 26 % — ABNORMAL LOW (ref 36.0–46.0)
Hemoglobin: 8.8 g/dL — ABNORMAL LOW (ref 12.0–15.0)
O2 Saturation: 85 %
Patient temperature: 98.2
Potassium: 4.1 mmol/L (ref 3.5–5.1)
Sodium: 149 mmol/L — ABNORMAL HIGH (ref 135–145)
TCO2: 27 mmol/L (ref 22–32)
pCO2 arterial: 30.6 mmHg — ABNORMAL LOW (ref 32.0–48.0)
pH, Arterial: 7.534 — ABNORMAL HIGH (ref 7.350–7.450)
pO2, Arterial: 43 mmHg — ABNORMAL LOW (ref 83.0–108.0)

## 2019-06-05 LAB — GLUCOSE, CAPILLARY
Glucose-Capillary: 106 mg/dL — ABNORMAL HIGH (ref 70–99)
Glucose-Capillary: 126 mg/dL — ABNORMAL HIGH (ref 70–99)
Glucose-Capillary: 153 mg/dL — ABNORMAL HIGH (ref 70–99)
Glucose-Capillary: 169 mg/dL — ABNORMAL HIGH (ref 70–99)
Glucose-Capillary: 55 mg/dL — ABNORMAL LOW (ref 70–99)
Glucose-Capillary: 78 mg/dL (ref 70–99)
Glucose-Capillary: 79 mg/dL (ref 70–99)
Glucose-Capillary: 82 mg/dL (ref 70–99)
Glucose-Capillary: 87 mg/dL (ref 70–99)

## 2019-06-05 LAB — CBC
HCT: 31.2 % — ABNORMAL LOW (ref 36.0–46.0)
Hemoglobin: 9.8 g/dL — ABNORMAL LOW (ref 12.0–15.0)
MCH: 29.5 pg (ref 26.0–34.0)
MCHC: 31.4 g/dL (ref 30.0–36.0)
MCV: 94 fL (ref 80.0–100.0)
Platelets: 182 10*3/uL (ref 150–400)
RBC: 3.32 MIL/uL — ABNORMAL LOW (ref 3.87–5.11)
RDW: 15.4 % (ref 11.5–15.5)
WBC: 15.9 10*3/uL — ABNORMAL HIGH (ref 4.0–10.5)
nRBC: 0.3 % — ABNORMAL HIGH (ref 0.0–0.2)

## 2019-06-05 LAB — BASIC METABOLIC PANEL
Anion gap: 14 (ref 5–15)
BUN: 67 mg/dL — ABNORMAL HIGH (ref 8–23)
CO2: 25 mmol/L (ref 22–32)
Calcium: 10.3 mg/dL (ref 8.9–10.3)
Chloride: 109 mmol/L (ref 98–111)
Creatinine, Ser: 1.39 mg/dL — ABNORMAL HIGH (ref 0.44–1.00)
GFR calc Af Amer: 47 mL/min — ABNORMAL LOW (ref >60)
GFR calc non Af Amer: 41 mL/min — ABNORMAL LOW (ref >60)
Glucose, Bld: 102 mg/dL — ABNORMAL HIGH (ref 70–99)
Potassium: 4.3 mmol/L (ref 3.5–5.1)
Sodium: 148 mmol/L — ABNORMAL HIGH (ref 135–145)

## 2019-06-05 LAB — PHOSPHORUS: Phosphorus: 4.5 mg/dL (ref 2.5–4.6)

## 2019-06-05 LAB — MAGNESIUM: Magnesium: 1.9 mg/dL (ref 1.7–2.4)

## 2019-06-05 LAB — HEPARIN LEVEL (UNFRACTIONATED): Heparin Unfractionated: <0.1 IU/mL — ABNORMAL LOW (ref 0.30–0.70)

## 2019-06-05 MED ORDER — DEXTROSE 50 % IV SOLN
INTRAVENOUS | Status: AC
  Administered 2019-06-05: 12.5 g via INTRAVENOUS
  Filled 2019-06-05: qty 50

## 2019-06-05 MED ORDER — DEXTROSE 50 % IV SOLN: 12.5000 g | Freq: Once | INTRAVENOUS | Status: AC

## 2019-06-05 MED ORDER — QUETIAPINE FUMARATE 100 MG PO TABS
100.0000 mg | ORAL_TABLET | Freq: Two times a day (BID) | ORAL | Status: DC
  Administered 2019-06-05 – 2019-06-13 (×15): 100 mg
  Filled 2019-06-05 (×15): qty 1

## 2019-06-05 MED ORDER — IPRATROPIUM-ALBUTEROL 0.5-2.5 (3) MG/3ML IN SOLN
3.0000 mL | Freq: Four times a day (QID) | RESPIRATORY_TRACT | Status: DC
  Administered 2019-06-05 – 2019-06-08 (×13): 3 mL via RESPIRATORY_TRACT
  Filled 2019-06-05 (×13): qty 3

## 2019-06-05 MED ORDER — BUDESONIDE 0.5 MG/2ML IN SUSP
0.5000 mg | Freq: Two times a day (BID) | RESPIRATORY_TRACT | Status: DC
  Administered 2019-06-05 – 2019-06-13 (×18): 0.5 mg via RESPIRATORY_TRACT
  Filled 2019-06-05 (×18): qty 2

## 2019-06-05 MED ORDER — FREE WATER
200.0000 mL | Status: DC
  Administered 2019-06-05 – 2019-06-07 (×12): 200 mL

## 2019-06-05 NOTE — Progress Notes (Signed)
Patient PO2 SATs down to 86%, call to RT, in room to assess patient. Patient up to 92%. Patients SATs back down to 86 %, call to RT, ok to increase FiO2 to 50%.

## 2019-06-05 NOTE — Progress Notes (Signed)
NAME:  Treba [name], MRN:  [id number], DOB:  08/01/1955, LOS: 9 ADMISSION DATE:  06/11/2019 REFERRING MD:  Dr. Patsey Berthold, [facility], CHIEF COMPLAINT:  Short of breath   Brief History   64 yo female with hypoxia from COVID 19 pneumonia with ARDS.  Transferred from [facility] 3/03.  Past Medical History  Hep C, COPD, Diastolic CHF, HTN  Significant Hospital Events   2/27- Admission to [place] unit 2/27- Transfer to Stepdown due to hypotension, increasing FiO2 requirements, metabolic acidosis requiring Bicarb gtt 2/28- intubated, initiated mechanical ventilation. Central venous access right femoral placed 3/01- continued issues with oxygenation despite mechanical ventilation, placed in prone position 3/02- oxygenation remains tenuous, resumed supine position, recruitment maneuvers initiated, patient tolerating, initiating potential transfer to [facility] unit 78M, spoke with Dr. Kara Mead 3/03-[place] 78M unit as bed available for patient will transfer.  Oxygen requirements are decreasing. 3/03: Admitted to [place] 3/4: No overnight events 3/7 persistent hypoxemia despite diuresis.   Consults:    Procedures:  ETT 2/28 >>  Lt PICC 3/05 >>   Significant Diagnostic Tests:  2/27- CT Renal Stone Study>> 1. Left nephrolithiasis, without obstructive uropathy. 2. Bibasilar peripheral ground-glass opacities, consistent with COVID-19 pneumonia. More focal nodular opacity in the left lower lobe could represent an area of consolidation or a true pulmonary nodule. Recommend follow-up chest CT at 3 months, after the patient is recovered from the acute illness. 3. Suspicion of cirrhosis, especially given the history of hepatitis B and C. 4. Aortic Atherosclerosis 2D Echocardiogram 3/1: LVEF 70 to 75%.  Hyperdynamic left ventricular function, left concentric ventricular hypertrophy, diastology normal normal right ventricular function.  Normal valve function  Micro Data:  SARS CoV2 2/27 >>  positive  Antimicrobials:  Cefepime 3/06 >  Interim history/subjective:  Setback overnight. On higher FiO2 now (60%) after spells of agitation and coughing caused desaturations.   Objective   Blood pressure 99/65, pulse 89, temperature 97.9 F (36.6 C), temperature source Oral, resp. rate (!) 27, height 5' 2.99" (1.6 m), weight 99.9 kg, SpO2 92 %.    Vent Mode: PRVC FiO2 (%):  [40 %-60 %] 60 % Set Rate:  [28 bmp] 28 bmp Vt Set:  [420 mL] 420 mL PEEP:  [8 cmH20] 8 cmH20 Pressure Support:  [10 cmH20] 10 cmH20 Plateau Pressure:  [21 cmH20-25 cmH20] 24 cmH20   Intake/Output Summary (Last 24 hours) at 06/05/2019 0949 Last data filed at 06/05/2019 0900 Gross per 24 hour  Intake 2893.18 ml  Output 1875 ml  Net 1018.18 ml   Filed Weights   06/03/19 0500 06/04/19 0500 06/05/19 0500  Weight: 98.9 kg 98.6 kg 99.9 kg    Examination:  General - obese female on vent Eyes - PERRL ENT - ETT, no JVD Cardiac - RRR, no MRG Chest - scattered rhonchi Abdomen - Soft, non-tender, normoactive BS Extremities - Trace edema. Warm, 2+ distal pulses Skin - no rashes Neuro - RASS -3   Resolved Hospital Problem list   Elevated troponin from demand ischemia, Bradycardia, Septic shock (HCAP/COVID),    Assessment & Plan:   Acute hypoxic respiratory failure with ARDS from COVID 19 pneumonia complicated by HCAP. Hx of COPD. - f/u CXR, ABG - goal PaO2:FiO2 ratio > 150 - goal plateau pressure < 30, driving pressure < 15 - Scheduled duoneb, budesonide - day 7 of cefepime.  - PSV as tolerated.  - continue decadron for 10 day course. Stop date set.   AKI from ATN 2nd to  hypoxia and sepsis. Hyperkalemia - Follow BMP  Hypernatremia Hyperchloremia - Change free water to q4 (from q2) - Continue to diurese today  Cool lower extremities.  - Now off pressors, extremities remain cool. Strong distal pulses.  - heparin off.   Hyperglycemia.  - CBG monitoring and SSI  Acute metabolic  encephalopathy from hypoxia, sepsis. - RASS goal -2 to -3 - Fentanyl infusion for RASS goal - PRN versed - Increase seroquel dosing - Scheduled clonazepam - Consider adding versed infusion back or adding depakote if higher dose seroquel is ineffective - Cannot tolerate propofol or precedex (bradycardia)  Best practice:  Diet: Tube feeds DVT prophylaxis: Heparin gtt GI prophylaxis: Protonix Mobility: bed rest Code Status: full code Family: Daughter updated via phone 3/11 Disposition: ICU  Labs    CMP Latest Ref Rng & Units 06/05/2019 06/05/2019 06/04/2019  Glucose 70 - 99 mg/dL 102(H) - 128(H)  BUN 8 - 23 mg/dL 67(H) - 70(H)  Creatinine 0.44 - 1.00 mg/dL 1.39(H) - 1.39(H)  Sodium 135 - 145 mmol/L 148(H) 149(H) 154(H)  Potassium 3.5 - 5.1 mmol/L 4.3 4.1 4.0  Chloride 98 - 111 mmol/L 109 - 115(H)  CO2 22 - 32 mmol/L 25 - 29  Calcium 8.9 - 10.3 mg/dL 10.3 - 10.2  Total Protein 6.5 - 8.1 g/dL - - -  Total Bilirubin 0.3 - 1.2 mg/dL - - -  Alkaline Phos 38 - 126 U/L - - -  AST 15 - 41 U/L - - -  ALT 0 - 44 U/L - - -    CBC Latest Ref Rng & Units 06/05/2019 06/05/2019 06/04/2019  WBC 4.0 - 10.5 K/uL 15.9(H) - 16.5(H)  Hemoglobin 12.0 - 15.0 g/dL 9.8(L) 8.8(L) 9.2(L)  Hematocrit 36.0 - 46.0 % 31.2(L) 26.0(L) 29.3(L)  Platelets 150 - 400 K/uL 182 - 207    ABG    Component Value Date/Time   PHART 7.534 (H) 06/05/2019 0343   PCO2ART 30.6 (L) 06/05/2019 0343   PO2ART 43.0 (L) 06/05/2019 0343   HCO3 25.9 06/05/2019 0343   TCO2 27 06/05/2019 0343   ACIDBASEDEF 1.1 05/28/2019 0730   O2SAT 85.0 06/05/2019 0343    CBG (last 3)  Recent Labs    06/05/19 0755 06/05/19 0825 06/05/19 0834  GLUCAP 55* 106* 78    Critical care time 45 minutes   Georgann Housekeeper, AGACNP-BC [place] for personal pager PCCM on call pager [phone]  06/05/2019 9:49 AM

## 2019-06-06 LAB — CBC
HCT: 31.3 % — ABNORMAL LOW (ref 36.0–46.0)
Hemoglobin: 9.7 g/dL — ABNORMAL LOW (ref 12.0–15.0)
MCH: 29.3 pg (ref 26.0–34.0)
MCHC: 31 g/dL (ref 30.0–36.0)
MCV: 94.6 fL (ref 80.0–100.0)
Platelets: 157 10*3/uL (ref 150–400)
RBC: 3.31 MIL/uL — ABNORMAL LOW (ref 3.87–5.11)
RDW: 15.2 % (ref 11.5–15.5)
WBC: 12.6 10*3/uL — ABNORMAL HIGH (ref 4.0–10.5)
nRBC: 0.2 % (ref 0.0–0.2)

## 2019-06-06 LAB — COMPREHENSIVE METABOLIC PANEL
ALT: 153 U/L — ABNORMAL HIGH (ref 0–44)
AST: 47 U/L — ABNORMAL HIGH (ref 15–41)
Albumin: 2.3 g/dL — ABNORMAL LOW (ref 3.5–5.0)
Alkaline Phosphatase: 110 U/L (ref 38–126)
Anion gap: 12 (ref 5–15)
BUN: 58 mg/dL — ABNORMAL HIGH (ref 8–23)
CO2: 23 mmol/L (ref 22–32)
Calcium: 9.9 mg/dL (ref 8.9–10.3)
Chloride: 110 mmol/L (ref 98–111)
Creatinine, Ser: 1.33 mg/dL — ABNORMAL HIGH (ref 0.44–1.00)
GFR calc Af Amer: 50 mL/min — ABNORMAL LOW (ref >60)
GFR calc non Af Amer: 43 mL/min — ABNORMAL LOW (ref >60)
Glucose, Bld: 117 mg/dL — ABNORMAL HIGH (ref 70–99)
Potassium: 4.8 mmol/L (ref 3.5–5.1)
Sodium: 145 mmol/L (ref 135–145)
Total Bilirubin: 0.4 mg/dL (ref 0.3–1.2)
Total Protein: 6.5 g/dL (ref 6.5–8.1)

## 2019-06-06 LAB — POCT I-STAT 7, (LYTES, BLD GAS, ICA,H+H)
Acid-Base Excess: 1 mmol/L (ref 0.0–2.0)
Bicarbonate: 24.1 mmol/L (ref 20.0–28.0)
Calcium, Ion: 1.38 mmol/L (ref 1.15–1.40)
HCT: 30 % — ABNORMAL LOW (ref 36.0–46.0)
Hemoglobin: 10.2 g/dL — ABNORMAL LOW (ref 12.0–15.0)
O2 Saturation: 91 %
Patient temperature: 98.2
Potassium: 4.6 mmol/L (ref 3.5–5.1)
Sodium: 144 mmol/L (ref 135–145)
TCO2: 25 mmol/L (ref 22–32)
pCO2 arterial: 32.1 mmHg (ref 32.0–48.0)
pH, Arterial: 7.482 — ABNORMAL HIGH (ref 7.350–7.450)
pO2, Arterial: 54 mmHg — ABNORMAL LOW (ref 83.0–108.0)

## 2019-06-06 LAB — GLUCOSE, CAPILLARY
Glucose-Capillary: 80 mg/dL (ref 70–99)
Glucose-Capillary: 92 mg/dL (ref 70–99)
Glucose-Capillary: 91 mg/dL (ref 70–99)
Glucose-Capillary: 85 mg/dL (ref 70–99)
Glucose-Capillary: 83 mg/dL (ref 70–99)
Glucose-Capillary: 73 mg/dL (ref 70–99)

## 2019-06-06 MED ORDER — CLONAZEPAM 0.5 MG PO TABS
0.5000 mg | ORAL_TABLET | Freq: Two times a day (BID) | ORAL | Status: DC
  Administered 2019-06-06 – 2019-06-13 (×12): 0.5 mg
  Filled 2019-06-06 (×13): qty 1

## 2019-06-06 NOTE — Progress Notes (Signed)
Assisted tele visit to patient with daughter. ° °Shanicka Oldenkamp P, RN  °

## 2019-06-06 NOTE — Plan of Care (Signed)

## 2019-06-06 NOTE — Progress Notes (Signed)
NAME:  Anne [name], MRN:  [id number], DOB:  1955-04-29, LOS: 35 ADMISSION DATE:  06/09/2019 REFERRING MD:  Dr. Patsey Berthold, [facility], CHIEF COMPLAINT:  Short of breath   Brief History   64 yo female with hypoxia from COVID 19 pneumonia with ARDS.  Transferred from [facility] 3/03.  Past Medical History  Hep C, COPD, Diastolic CHF, HTN  Significant Hospital Events   2/27- Admission to [place] unit 2/27- Transfer to Stepdown due to hypotension, increasing FiO2 requirements, metabolic acidosis requiring Bicarb gtt 2/28- intubated, initiated mechanical ventilation. Central venous access right femoral placed 3/01- continued issues with oxygenation despite mechanical ventilation, placed in prone position 3/02- oxygenation remains tenuous, resumed supine position, recruitment maneuvers initiated, patient tolerating, initiating potential transfer to [facility] unit 77M, spoke with Dr. Kara Mead 3/03-[place] 77M unit as bed available for patient will transfer.  Oxygen requirements are decreasing. 3/03: Admitted to [place] 3/4: No overnight events 3/7 persistent hypoxemia despite diuresis.  3/12 - Setback overnight. On higher FiO2 now (60%) after spells of agitation and coughing caused desaturations.   Consults:    Procedures:  ETT 2/28 >>  Lt PICC 3/05 >>   Significant Diagnostic Tests:  2/27- CT Renal Stone Study>> 1. Left nephrolithiasis, without obstructive uropathy. 2. Bibasilar peripheral ground-glass opacities, consistent with COVID-19 pneumonia. More focal nodular opacity in the left lower lobe could represent an area of consolidation or a true pulmonary nodule. Recommend follow-up chest CT at 3 months, after the patient is recovered from the acute illness. 3. Suspicion of cirrhosis, especially given the history of hepatitis B and C. 4. Aortic Atherosclerosis 2D Echocardiogram 3/1: LVEF 70 to 75%.  Hyperdynamic left ventricular function, left concentric ventricular  hypertrophy, diastology normal normal right ventricular function.  Normal valve function  Micro Data:  SARS CoV2 2/27 >> positive Bllood 2/27 - neg c dff 2/27 -neg MRSA pcr 2/27 - POSITIVe     Antimicrobials:  Doxy 3/6 - 3/8 Cefepime 3/06 > 3/13 Remdesivir 3/4 - 3/4 Decadron    Interim history/subjective:   3/13 -on 50% FiO2 and a PEEP of 8.  On fentanyl infusion.  Not on pressors.  Making urine.  No fever.  On wake up assessment following commands.  However she is quite exhausted and looks physically deconditioned according to bedside nursing.  Objective   Blood pressure 113/72, pulse 67, temperature (!) 97.3 F (36.3 C), temperature source Axillary, resp. rate (!) 28, height 5' 2.99" (1.6 m), weight 99.9 kg, SpO2 98 %.    Vent Mode: PRVC FiO2 (%):  [50 %-60 %] 50 % Set Rate:  [28 bmp] 28 bmp Vt Set:  [420 mL] 420 mL PEEP:  [8 cmH20] 8 cmH20 Plateau Pressure:  [24 cmH20-28 cmH20] 28 cmH20   Intake/Output Summary (Last 24 hours) at 06/06/2019 0958 Last data filed at 06/06/2019 0900 Gross per 24 hour  Intake 1341.21 ml  Output 2700 ml  Net -1358.79 ml   Filed Weights   06/03/19 0500 06/04/19 0500 06/05/19 0500  Weight: 98.9 kg 98.6 kg 99.9 kg    Examination:  Overweight female on the ventilator.  50% FiO2 PEEP of 8.  Synchronous with the ventilator.  Looks physically deconditioned and exhausted.  Nods to questions.  On fentanyl infusion RASS sedation score is 0 to -1.  Abdomen is soft.  Mild diffuse edema present.  Chest x-ray with lower lobe infiltrates.  Personally visualized.  Chart review does not reveal baseline pulmonary function.  Resolved Hospital Problem  list   Elevated troponin from demand ischemia, Bradycardia, Septic shock (HCAP/COVID),    Assessment & Plan:   Acute hypoxic respiratory failure with ARDS from COVID 19 pneumonia complicated by HCAP. Hx of COPD.  06/06/2019: Much improved but does not meet spontaneous breathing trial criteria due to 50%  FiO2 and PEEP of 8.  Also high pretest probably that she needs tracheostomy.  Plan - f/u CXR, ABG - goal PaO2:FiO2 ratio > 150 - goal plateau pressure < 30, driving pressure < 15 - Scheduled duoneb, budesonide - day 7 of cefepime.  - PSV as tolerated.   -No extubation 06/06/2019 -Monitor situation and likely needs tracheostomy if unable to extubate in the next few days  AKI from ATN 2nd to hypoxia and sepsis.  06/06/2019: Making urine normal potassium creatinine improved to 1.3  Plan - Follow BMP  Hypernatremia Hyperchloremia  06/06/2019: Sodium improved but high chloride persists.  Plan -  free water    Cool lower extremities.  - Now off pressors, extremities remain cool. Strong distal pulses.  - heparin off.   Hyperglycemia.  - CBG monitoring and SSI  Acute metabolic encephalopathy from hypoxia, sepsis.  06/06/2019: Following commands and wake up assessment.  RASS sedation score of 0 to -1.  Plan - RASS goal 0 to -1- Fentanyl infusion for RASS goal - PRN versed - seroquel dosing -Decrease  scheduled clonazepam - Consider adding versed infusion back or adding depakote if higher dose seroquel is ineffective - Cannot tolerate propofol or precedex (bradycardia)  Best practice:  Diet: Tube feeds DVT prophylaxis: Heparin gtt GI prophylaxis: Protonix Mobility: bed rest Code Status: full code Family: Daughter updated via phone 3/11. Also, by Dr Chase Caller on  10:15 AM 06/06/2019 - updated informed her that she is not extubatable   Disposition: ICU   ATTESTATION & SIGNATURE   The patient Anne [name] is critically ill with multiple organ systems failure and requires high complexity decision making for assessment and support, frequent evaluation and titration of therapies, application of advanced monitoring technologies and extensive interpretation of multiple databases.   Critical Care Time devoted to patient care services described in this note is  35  Minutes.  This time reflects time of care of this signee Dr Brand Males. This critical care time does not reflect procedure time, or teaching time or supervisory time of PA/NP/Med student/Med Resident etc but could involve care discussion time     Dr. Brand Males, M.D., [facility].C.P Pulmonary and Critical Care Medicine Staff Physician [place] [facility] Pager: [phone], If no answer or between  15:00h - 7:00h: call [phone]  06/06/2019 9:58 AM     LABS    PULMONARY Recent Labs  Lab 06/02/19 0159 06/03/19 0225 06/04/19 0138 06/05/19 0343 06/06/19 0437  PHART 7.396 7.457* 7.522* 7.534* 7.482*  PCO2ART 40.5 40.2 39.9 30.6* 32.1  PO2ART 71.0* 63.0* 68.0* 43.0* 54.0*  HCO3 24.9 28.4* 32.8* 25.9 24.1  TCO2 26 30 34* 27 25  O2SAT 94.0 93.0 95.0 85.0 91.0    CBC Recent Labs  Lab 06/04/19 0500 06/05/19 0343 06/05/19 0454 06/06/19 0409 06/06/19 0437  HGB 9.2*   < > 9.8* 9.7* 10.2*  HCT 29.3*   < > 31.2* 31.3* 30.0*  WBC 16.5*  --  15.9* 12.6*  --   PLT 207  --  182 157  --    < > = values in this interval not displayed.  COAGULATION Recent Labs  Lab 06/01/19 0439  INR 1.1    CARDIAC  No results for input(s): TROPONINI in the last 168 hours. No results for input(s): PROBNP in the last 168 hours.   CHEMISTRY Recent Labs  Lab 06/03/19 1615 06/03/19 1615 06/03/19 2203 06/04/19 0138 06/04/19 0500 06/04/19 0500 06/05/19 0343 06/05/19 0343 06/05/19 0454 06/05/19 0454 06/06/19 0409 06/06/19 0437  NA 149*   < > 153*   < > 154*  --  149*  --  148*  --  145 144  K 4.3   < > 4.2   < > 4.0   < > 4.1   < > 4.3   < > 4.8 4.6  CL 112*  --  114*  --  115*  --   --   --  109  --  110  --   CO2 25  --  28  --  29  --   --   --  25  --  23  --   GLUCOSE 225*  --  195*  --  128*  --   --   --  102*  --  117*  --   BUN 63*  --  71*  --  70*  --   --   --  67*  --  58*  --   CREATININE 1.36*  --  1.38*  --  1.39*  --   --    --  1.39*  --  1.33*  --   CALCIUM 9.4  --  9.8  --  10.2  --   --   --  10.3  --  9.9  --   MG  --   --  1.8  --  1.9  --   --   --  1.9  --   --   --   PHOS  --   --   --   --  3.4  --   --   --  4.5  --   --   --    < > = values in this interval not displayed.   Estimated Creatinine Clearance: 49.4 mL/min (A) (by C-G formula based on SCr of 1.33 mg/dL (H)).   LIVER Recent Labs  Lab 05/31/19 0500 06/01/19 0439 06/03/19 0500 06/06/19 0409  AST 50*  --  390* 47*  ALT 56*  --  324* 153*  ALKPHOS 83  --  148* 110  BILITOT 1.0  --  0.6 0.4  PROT 6.2*  --  7.2 6.5  ALBUMIN 2.2*  --  2.4* 2.3*  INR  --  1.1  --   --      INFECTIOUS Recent Labs  Lab 06/02/19 1030  LATICACIDVEN 1.9     ENDOCRINE CBG (last 3)  Recent Labs    06/05/19 2345 06/06/19 0327 06/06/19 0734  GLUCAP 126* 80 92         IMAGING x48h  - image(s) personally visualized  -   highlighted in bold DG Chest Port 1 View  Result Date: 06/05/2019 CLINICAL DATA:  Respiratory failure. EXAM: PORTABLE CHEST 1 VIEW COMPARISON:  06/03/2019 FINDINGS: The cardiac silhouette, mediastinal and hilar contours are stable. The endotracheal tube is 2.3 cm above the carina. The NG tube is stable and the left PICC line is stable. Persistent patchy bilateral lung infiltrates. No definite pleural effusions or pneumothorax. IMPRESSION: 1. Stable support apparatus. 2. Persistent patchy bilateral lung infiltrates. Electronically Signed  By: Marijo Sanes M.D.   On: 06/05/2019 05:59

## 2019-06-07 ENCOUNTER — Inpatient Hospital Stay (HOSPITAL_COMMUNITY): Payer: Medicaid Other

## 2019-06-07 LAB — CBC WITH DIFFERENTIAL/PLATELET
Abs Immature Granulocytes: 0.33 10*3/uL — ABNORMAL HIGH (ref 0.00–0.07)
Basophils Absolute: 0 10*3/uL (ref 0.0–0.1)
Basophils Relative: 0 %
Eosinophils Absolute: 0.2 10*3/uL (ref 0.0–0.5)
Eosinophils Relative: 1 %
HCT: 32.9 % — ABNORMAL LOW (ref 36.0–46.0)
Hemoglobin: 10.2 g/dL — ABNORMAL LOW (ref 12.0–15.0)
Immature Granulocytes: 2 %
Lymphocytes Relative: 7 %
Lymphs Abs: 1 10*3/uL (ref 0.7–4.0)
MCH: 29.3 pg (ref 26.0–34.0)
MCHC: 31 g/dL (ref 30.0–36.0)
MCV: 94.5 fL (ref 80.0–100.0)
Monocytes Absolute: 0.9 10*3/uL (ref 0.1–1.0)
Monocytes Relative: 6 %
Neutro Abs: 11.6 10*3/uL — ABNORMAL HIGH (ref 1.7–7.7)
Neutrophils Relative %: 84 %
Platelets: 182 10*3/uL (ref 150–400)
RBC: 3.48 MIL/uL — ABNORMAL LOW (ref 3.87–5.11)
RDW: 15.6 % — ABNORMAL HIGH (ref 11.5–15.5)
WBC: 14 10*3/uL — ABNORMAL HIGH (ref 4.0–10.5)
nRBC: 0.1 % (ref 0.0–0.2)

## 2019-06-07 LAB — BASIC METABOLIC PANEL
Anion gap: 13 (ref 5–15)
Anion gap: 12 (ref 5–15)
BUN: 57 mg/dL — ABNORMAL HIGH (ref 8–23)
BUN: 68 mg/dL — ABNORMAL HIGH (ref 8–23)
CO2: 21 mmol/L — ABNORMAL LOW (ref 22–32)
CO2: 24 mmol/L (ref 22–32)
Calcium: 10.1 mg/dL (ref 8.9–10.3)
Calcium: 10.4 mg/dL — ABNORMAL HIGH (ref 8.9–10.3)
Chloride: 106 mmol/L (ref 98–111)
Chloride: 105 mmol/L (ref 98–111)
Creatinine, Ser: 1.51 mg/dL — ABNORMAL HIGH (ref 0.44–1.00)
Creatinine, Ser: 1.88 mg/dL — ABNORMAL HIGH (ref 0.44–1.00)
GFR calc Af Amer: 42 mL/min — ABNORMAL LOW (ref >60)
GFR calc Af Amer: 33 mL/min — ABNORMAL LOW (ref >60)
GFR calc non Af Amer: 37 mL/min — ABNORMAL LOW (ref >60)
GFR calc non Af Amer: 28 mL/min — ABNORMAL LOW (ref >60)
Glucose, Bld: 108 mg/dL — ABNORMAL HIGH (ref 70–99)
Glucose, Bld: 150 mg/dL — ABNORMAL HIGH (ref 70–99)
Potassium: 5.6 mmol/L — ABNORMAL HIGH (ref 3.5–5.1)
Potassium: 5.3 mmol/L — ABNORMAL HIGH (ref 3.5–5.1)
Sodium: 140 mmol/L (ref 135–145)
Sodium: 141 mmol/L (ref 135–145)

## 2019-06-07 LAB — POCT I-STAT 7, (LYTES, BLD GAS, ICA,H+H)
Bicarbonate: 24.1 mmol/L (ref 20.0–28.0)
Bicarbonate: 23.9 mmol/L (ref 20.0–28.0)
Calcium, Ion: 1.38 mmol/L (ref 1.15–1.40)
Calcium, Ion: 1.41 mmol/L — ABNORMAL HIGH (ref 1.15–1.40)
HCT: 32 % — ABNORMAL LOW (ref 36.0–46.0)
HCT: 32 % — ABNORMAL LOW (ref 36.0–46.0)
Hemoglobin: 10.9 g/dL — ABNORMAL LOW (ref 12.0–15.0)
Hemoglobin: 10.9 g/dL — ABNORMAL LOW (ref 12.0–15.0)
O2 Saturation: 84 %
O2 Saturation: 82 %
Patient temperature: 98.6
Potassium: 5.2 mmol/L — ABNORMAL HIGH (ref 3.5–5.1)
Potassium: 5.3 mmol/L — ABNORMAL HIGH (ref 3.5–5.1)
Sodium: 141 mmol/L (ref 135–145)
Sodium: 141 mmol/L (ref 135–145)
TCO2: 25 mmol/L (ref 22–32)
TCO2: 25 mmol/L (ref 22–32)
pCO2 arterial: 34.7 mmHg (ref 32.0–48.0)
pCO2 arterial: 37.6 mmHg (ref 32.0–48.0)
pH, Arterial: 7.45 (ref 7.350–7.450)
pH, Arterial: 7.412 (ref 7.350–7.450)
pO2, Arterial: 46 mmHg — ABNORMAL LOW (ref 83.0–108.0)
pO2, Arterial: 45 mmHg — ABNORMAL LOW (ref 83.0–108.0)

## 2019-06-07 LAB — COMPREHENSIVE METABOLIC PANEL
ALT: 133 U/L — ABNORMAL HIGH (ref 0–44)
AST: 44 U/L — ABNORMAL HIGH (ref 15–41)
Albumin: 2.4 g/dL — ABNORMAL LOW (ref 3.5–5.0)
Alkaline Phosphatase: 101 U/L (ref 38–126)
Anion gap: 12 (ref 5–15)
BUN: 55 mg/dL — ABNORMAL HIGH (ref 8–23)
CO2: 23 mmol/L (ref 22–32)
Calcium: 10.2 mg/dL (ref 8.9–10.3)
Chloride: 107 mmol/L (ref 98–111)
Creatinine, Ser: 1.43 mg/dL — ABNORMAL HIGH (ref 0.44–1.00)
GFR calc Af Amer: 45 mL/min — ABNORMAL LOW (ref >60)
GFR calc non Af Amer: 39 mL/min — ABNORMAL LOW (ref >60)
Glucose, Bld: 116 mg/dL — ABNORMAL HIGH (ref 70–99)
Potassium: 5.4 mmol/L — ABNORMAL HIGH (ref 3.5–5.1)
Sodium: 142 mmol/L (ref 135–145)
Total Bilirubin: 0.6 mg/dL (ref 0.3–1.2)
Total Protein: 6.9 g/dL (ref 6.5–8.1)

## 2019-06-07 LAB — GLUCOSE, CAPILLARY
Glucose-Capillary: 102 mg/dL — ABNORMAL HIGH (ref 70–99)
Glucose-Capillary: 71 mg/dL (ref 70–99)
Glucose-Capillary: 87 mg/dL (ref 70–99)
Glucose-Capillary: 110 mg/dL — ABNORMAL HIGH (ref 70–99)
Glucose-Capillary: 127 mg/dL — ABNORMAL HIGH (ref 70–99)
Glucose-Capillary: 143 mg/dL — ABNORMAL HIGH (ref 70–99)

## 2019-06-07 LAB — PHOSPHORUS: Phosphorus: 4.9 mg/dL — ABNORMAL HIGH (ref 2.5–4.6)

## 2019-06-07 LAB — MAGNESIUM: Magnesium: 1.8 mg/dL (ref 1.7–2.4)

## 2019-06-07 MED ORDER — MIDAZOLAM HCL 2 MG/2ML IJ SOLN
1.0000 mg | INTRAMUSCULAR | Status: DC | PRN
  Administered 2019-06-07 (×2): 2 mg via INTRAVENOUS
  Administered 2019-06-08: 4 mg via INTRAVENOUS
  Administered 2019-06-08: 2 mg via INTRAVENOUS
  Administered 2019-06-08: 4 mg via INTRAVENOUS
  Administered 2019-06-08 – 2019-06-13 (×4): 2 mg via INTRAVENOUS
  Filled 2019-06-07 (×2): qty 2
  Filled 2019-06-07: qty 4
  Filled 2019-06-07 (×2): qty 2
  Filled 2019-06-07: qty 4
  Filled 2019-06-07 (×3): qty 2

## 2019-06-07 MED ORDER — DEXMEDETOMIDINE HCL IN NACL 400 MCG/100ML IV SOLN
0.4000 ug/kg/h | INTRAVENOUS | Status: DC
  Administered 2019-06-07: 0.4 ug/kg/h via INTRAVENOUS
  Administered 2019-06-08: 0.6 ug/kg/h via INTRAVENOUS
  Administered 2019-06-08 – 2019-06-09 (×3): 0.8 ug/kg/h via INTRAVENOUS
  Administered 2019-06-09: 0.5 ug/kg/h via INTRAVENOUS
  Administered 2019-06-09: 0.8 ug/kg/h via INTRAVENOUS
  Administered 2019-06-10 (×3): 0.5 ug/kg/h via INTRAVENOUS
  Administered 2019-06-11: 0.7 ug/kg/h via INTRAVENOUS
  Administered 2019-06-11: 0.5 ug/kg/h via INTRAVENOUS
  Administered 2019-06-11: 0.7 ug/kg/h via INTRAVENOUS
  Administered 2019-06-12: 0.6 ug/kg/h via INTRAVENOUS
  Administered 2019-06-12: 0.7 ug/kg/h via INTRAVENOUS
  Administered 2019-06-12 (×2): 0.6 ug/kg/h via INTRAVENOUS
  Administered 2019-06-13: 0.7 ug/kg/h via INTRAVENOUS
  Filled 2019-06-07 (×15): qty 100

## 2019-06-07 MED ORDER — SODIUM ZIRCONIUM CYCLOSILICATE 10 G PO PACK
10.0000 g | PACK | Freq: Once | ORAL | Status: AC
  Administered 2019-06-07: 10 g via ORAL
  Filled 2019-06-07: qty 1

## 2019-06-07 MED ORDER — FUROSEMIDE 10 MG/ML IJ SOLN
20.0000 mg | Freq: Two times a day (BID) | INTRAMUSCULAR | Status: DC
  Administered 2019-06-07 – 2019-06-08 (×2): 20 mg via INTRAVENOUS
  Filled 2019-06-07 (×2): qty 2

## 2019-06-07 MED ORDER — MAGNESIUM SULFATE 2 GM/50ML IV SOLN
2.0000 g | Freq: Once | INTRAVENOUS | Status: AC
  Administered 2019-06-07: 2 g via INTRAVENOUS
  Filled 2019-06-07: qty 50

## 2019-06-07 MED ORDER — FUROSEMIDE 10 MG/ML IJ SOLN
40.0000 mg | Freq: Two times a day (BID) | INTRAMUSCULAR | Status: DC
  Administered 2019-06-07: 40 mg via INTRAVENOUS
  Filled 2019-06-07: qty 4

## 2019-06-07 MED ORDER — NOREPINEPHRINE 4 MG/250ML-% IV SOLN
0.0000 ug/min | INTRAVENOUS | Status: DC
  Administered 2019-06-08: 9 ug/min via INTRAVENOUS
  Administered 2019-06-08: 22 ug/min via INTRAVENOUS
  Administered 2019-06-09: 11 ug/min via INTRAVENOUS
  Administered 2019-06-09: 13 ug/min via INTRAVENOUS
  Filled 2019-06-07 (×4): qty 250

## 2019-06-07 MED ORDER — MIDAZOLAM HCL 2 MG/2ML IJ SOLN: 4.0000 mg | Freq: Once | INTRAMUSCULAR | Status: AC

## 2019-06-07 MED ORDER — NOREPINEPHRINE 4 MG/250ML-% IV SOLN
INTRAVENOUS | Status: AC
  Administered 2019-06-07: 2 ug/min via INTRAVENOUS
  Filled 2019-06-07: qty 250

## 2019-06-07 MED ORDER — MIDAZOLAM HCL 2 MG/2ML IJ SOLN
INTRAMUSCULAR | Status: AC
  Administered 2019-06-07: 4 mg via INTRAVENOUS
  Filled 2019-06-07: qty 4

## 2019-06-07 NOTE — Progress Notes (Signed)
NAME:  Anne [name], MRN:  [id number], DOB:  [birth year], LOS: 69 ADMISSION DATE:  06/20/2019 REFERRING MD:  Dr. Patsey Berthold, [facility], CHIEF COMPLAINT:  Short of breath   Brief History   64 yo female with hypoxia from COVID 19 pneumonia with ARDS.  Transferred from [facility] 3/03.  Past Medical History  Hep C, COPD, Diastolic CHF, HTN  Significant Hospital Events   2/27- Admission to [place] unit 2/27- Transfer to Stepdown due to hypotension, increasing FiO2 requirements, metabolic acidosis requiring Bicarb gtt 2/28- intubated, initiated mechanical ventilation. Central venous access right femoral placed 3/01- continued issues with oxygenation despite mechanical ventilation, placed in prone position 3/02- oxygenation remains tenuous, resumed supine position, recruitment maneuvers initiated, patient tolerating, initiating potential transfer to [facility] unit 9M, spoke with Dr. Kara Mead 3/03-[place] 9M unit as bed available for patient will transfer.  Oxygen requirements are decreasing. 3/03: Admitted to [place] 3/4: No overnight events 3/7 persistent hypoxemia despite diuresis.  3/12 - Setback overnight. On higher FiO2 now (60%) after spells of agitation and coughing caused desaturations.   3/13 -on 50% FiO2 and a PEEP of 8.  On fentanyl infusion.  Not on pressors.  Making urine.  No fever.  On wake up assessment following commands.  However she is quite exhausted and looks physically deconditioned according to bedside nursing.   Consults:    Procedures:  ETT 2/28 >>  Lt PICC 3/05 >>   Significant Diagnostic Tests:  2/27- CT Renal Stone Study>> 1. Left nephrolithiasis, without obstructive uropathy. 2. Bibasilar peripheral ground-glass opacities, consistent with COVID-19 pneumonia. More focal nodular opacity in the left lower lobe could represent an area of consolidation or a true pulmonary nodule. Recommend follow-up chest CT at 3 months, after the patient is recovered  from the acute illness. 3. Suspicion of cirrhosis, especially given the history of hepatitis B and C. 4. Aortic Atherosclerosis 2D Echocardiogram 3/1: LVEF 70 to 75%.  Hyperdynamic left ventricular function, left concentric ventricular hypertrophy, diastology normal normal right ventricular function.  Normal valve function  Micro Data:  SARS CoV2 2/27 >> positive Bllood 2/27 - neg c dff 2/27 -neg MRSA pcr 2/27 - POSITIVe     Antimicrobials:  Doxy 3/6 - 3/8 Cefepime 3/06 > 3/13 Remdesivir 3/4 - 3/4 Decadron    Interim history/subjective:    06/07/2019 -on 50% FiO2 and a PEEP of 8.  Significant dyssynchrony with the ventilator.  Called to the bedside and upon evaluation noticed significant air trapping and patient looking exhausted.  Respiratory therapy then disconnect the patient from the ventilator for 20 seconds and reconnected changing the respiratory rate from 28-12.  With this patient Synchrony significantly improved as did her pulse ox.-> 92%  Objective   Blood pressure 101/67, pulse 85, temperature 99.5 F (37.5 C), temperature source Axillary, resp. rate (!) 21, height 5' 2.99" (1.6 m), weight 98.6 kg, SpO2 97 %.    Vent Mode: PRVC FiO2 (%):  [40 %-50 %] 50 % Set Rate:  [12 bmp-28 bmp] 12 bmp Vt Set:  [420 mL] 420 mL PEEP:  [8 cmH20] 8 cmH20 Plateau Pressure:  [25 cmH20-30 cmH20] 28 cmH20   Intake/Output Summary (Last 24 hours) at 06/07/2019 0941 Last data filed at 06/07/2019 0900 Gross per 24 hour  Intake 2352.12 ml  Output 2475 ml  Net -122.88 ml   Filed Weights   06/04/19 0500 06/05/19 0500 06/07/19 0445  Weight: 98.6 kg 99.9 kg 98.6 kg    Examination:  Overweight  female on the ventilator.  50% FiO2 PEEP of 8.  Synchronous with the ventilator after changes.  Looks physically deconditioned and exhausted.  RASS -4.  Abdomen is soft.  Mild diffuse edema present.  Chest x-ray with lower lobe infiltrates.  Personally visualized.   No fever  Resolved Hospital  Problem list   Elevated troponin from demand ischemia, Bradycardia, Septic shock (HCAP/COVID),    Assessment & Plan:   Acute hypoxic respiratory failure with ARDS from COVID 19 pneumonia complicated by HCAP. Hx of COPD.   06/07/2019 - > does not meet criteria for SBT/Extubation in setting of Acute Respiratory Failure due to air trapping and dysnchronchy and fio2 50% and peep 8. High prob needs trach   Plan - PRVC  But allow for permissv hypercapnia with copd  - set RR 12  - - goal plateau pressure < 30, driving pressure < 15 - Scheduled duoneb, budesonide - -No SBT or extubation 06/06/2019 -Monitor situation and likely needs tracheostomy if unable to extubate in the next few days  AKI from ATN 2nd to hypoxia and sepsis.   06/07/2019: Creatinine worse at 1.4 mg percent.  Potassium 5.2.  Plan -Start free water -Start scheduled diuresis -Avoid potassium containing supplements - Follow BMP   Cool lower extremities.  - Now off pressors, extremities remain cool. Strong distal pulses.  - heparin off.   Hyperglycemia.  - CBG monitoring and SSI  Acute metabolic encephalopathy from hypoxia, sepsis.  06/06/2019: Following commands and wake up assessment.  RASS sedation score of 0 to -1.  On 06/07/2019: Deeply sedated because of ventilator dyssynchrony  Plan - RASS goal 0 to -1- Fentanyl infusion for RASS goal - PRN versed - seroquel dosing -scheduled clonazepam - Consider adding versed infusion back or adding depakote if higher dose seroquel is ineffective - Cannot tolerate propofol or precedex (bradycardia)   Transaminitis  3.14 - improving  Plan  - monitor  Electrolyte imbalance  - 3/14 - mild low mag  Plan  - replete mag   Best practice:  Diet: Tube feeds DVT prophylaxis: Heparin 5000 tid GI prophylaxis: Protonix Mobility: bed rest Code Status: full code Family: Daughter updated via phone 3  3/11 - [place].   - by Dr Chase Caller on  10:15 AM 06/06/2019 -  updated informed her that she is not extubatable  - by Dr Chase Caller on 06/07/2019 , 10:05 AM - updated about AKI and ventilator dependence. She is agreeable for trach     Disposition: ICU   ATTESTATION & SIGNATURE   The patient Anne [name] is critically ill with multiple organ systems failure and requires high complexity decision making for assessment and support, frequent evaluation and titration of therapies, application of advanced monitoring technologies and extensive interpretation of multiple databases.   Critical Care Time devoted to patient care services described in this note is  40  Minutes. This time reflects time of care of this signee Dr Brand Males. This critical care time does not reflect procedure time, or teaching time or supervisory time of PA/NP/Med student/Med Resident etc but could involve care discussion time     Dr. Brand Males, M.D., [facility].C.P Pulmonary and Critical Care Medicine Staff Physician [place] [facility] and Critical Care Pager: [phone], If no answer or between  15:00h - 7:00h: call [phone]  06/07/2019 10:07 AM      LABS    PULMONARY Recent Labs  Lab 06/03/19 0225 06/04/19 0138 06/05/19 0343 06/06/19  0437 06/07/19 0425  PHART 7.457* 7.522* 7.534* 7.482* 7.450  PCO2ART 40.2 39.9 30.6* 32.1 34.7  PO2ART 63.0* 68.0* 43.0* 54.0* 46.0*  HCO3 28.4* 32.8* 25.9 24.1 24.1  TCO2 30 34* 27 25 25   O2SAT 93.0 95.0 85.0 91.0 84.0    CBC Recent Labs  Lab 06/05/19 0454 06/05/19 0454 06/06/19 0409 06/06/19 0409 06/06/19 0437 06/07/19 0301 06/07/19 0425  HGB 9.8*   < > 9.7*   < > 10.2* 10.2* 10.9*  HCT 31.2*   < > 31.3*   < > 30.0* 32.9* 32.0*  WBC 15.9*  --  12.6*  --   --  14.0*  --   PLT 182  --  157  --   --  182  --    < > = values in this interval not displayed.    COAGULATION Recent Labs  Lab 06/01/19 0439  INR 1.1    CARDIAC  No results for input(s): TROPONINI in the last 168  hours. No results for input(s): PROBNP in the last 168 hours.   CHEMISTRY Recent Labs  Lab 06/03/19 2203 06/04/19 0138 06/04/19 0500 06/05/19 0343 06/05/19 0454 06/05/19 0454 06/06/19 0409 06/06/19 0409 06/06/19 0437 06/06/19 0437 06/07/19 0301 06/07/19 0425  NA 153*   < > 154*   < > 148*  --  145  --  144  --  142 141  K 4.2   < > 4.0   < > 4.3   < > 4.8   < > 4.6   < > 5.4* 5.2*  CL 114*  --  115*  --  109  --  110  --   --   --  107  --   CO2 28  --  29  --  25  --  23  --   --   --  23  --   GLUCOSE 195*  --  128*  --  102*  --  117*  --   --   --  116*  --   BUN 71*  --  70*  --  67*  --  58*  --   --   --  55*  --   CREATININE 1.38*  --  1.39*  --  1.39*  --  1.33*  --   --   --  1.43*  --   CALCIUM 9.8  --  10.2  --  10.3  --  9.9  --   --   --  10.2  --   MG 1.8  --  1.9  --  1.9  --   --   --   --   --  1.8  --   PHOS  --   --  3.4  --  4.5  --   --   --   --   --  4.9*  --    < > = values in this interval not displayed.   Estimated Creatinine Clearance: 45.7 mL/min (A) (by C-G formula based on SCr of 1.43 mg/dL (H)).   LIVER Recent Labs  Lab 06/01/19 0439 06/03/19 0500 06/06/19 0409 06/07/19 0301  AST  --  390* 47* 44*  ALT  --  324* 153* 133*  ALKPHOS  --  148* 110 101  BILITOT  --  0.6 0.4 0.6  PROT  --  7.2 6.5 6.9  ALBUMIN  --  2.4* 2.3* 2.4*  INR 1.1  --   --   --  INFECTIOUS Recent Labs  Lab 06/02/19 1030  LATICACIDVEN 1.9     ENDOCRINE CBG (last 3)  Recent Labs    06/06/19 2315 06/07/19 0155 06/07/19 0747  GLUCAP 73 102* 71         IMAGING x48h  - image(s) personally visualized  -   highlighted in bold DG CHEST PORT 1 VIEW  Result Date: 06/07/2019 CLINICAL DATA:  Intubated EXAM: PORTABLE CHEST 1 VIEW COMPARISON:  06/05/2019 chest radiograph. FINDINGS: Endotracheal tube tip is 2.2 cm above the carina. Enteric tube enters stomach with the tip not seen on this image. Left PICC terminates over the right atrium. Stable  cardiomediastinal silhouette with normal heart size. No pneumothorax. No pleural effusion. Extensive patchy opacities in both lungs, slightly worsened. IMPRESSION: 1. Stable support structures as detailed. 2. Extensive patchy opacities in both lungs, slightly worsened, favoring multilobar pneumonia. Electronically Signed   By: Ilona Sorrel M.D.   On: 06/07/2019 09:37

## 2019-06-07 NOTE — Plan of Care (Signed)
  Problem: Clinical Measurements: Goal: Will remain free from infection Outcome: Progressing Goal: Diagnostic test results will improve Outcome: Progressing Goal: Cardiovascular complication will be avoided Outcome: Progressing   Problem: Activity: Goal: Risk for activity intolerance will decrease Outcome: Progressing   Problem: Nutrition: Goal: Adequate nutrition will be maintained Outcome: Progressing   Problem: Coping: Goal: Level of anxiety will decrease Outcome: Progressing   Problem: Elimination: Goal: Will not experience complications related to bowel motility Outcome: Progressing Goal: Will not experience complications related to urinary retention Outcome: Progressing   Problem: Pain Managment: Goal: General experience of comfort will improve Outcome: Progressing   Problem: Safety: Goal: Ability to remain free from injury will improve Outcome: Progressing   Problem: Skin Integrity: Goal: Risk for impaired skin integrity will decrease Outcome: Progressing   Problem: Coping: Goal: Psychosocial and spiritual needs will be supported Outcome: Progressing   Problem: Respiratory: Goal: Will maintain a patent airway Outcome: Progressing Goal: Complications related to the disease process, condition or treatment will be avoided or minimized Outcome: Progressing

## 2019-06-07 NOTE — Progress Notes (Signed)
Notified Merlene Laughter, NP that pt was persistently hypotensive despite weaning down sedation.  Precedex was added due to pts agitation and versed bolus was given at approx 1730.  Current BP 73/60 (66), RASS -4/-5, CVP 5. NP ordered levophed.

## 2019-06-07 NOTE — Progress Notes (Signed)
reoeat BMET - K 5.6, Creat wiorse 1,5. Had urine in bladder and did responds to lasix   Also agitated despite fent prn. Tachy abnd has normal BP  Plan  - foley - lokelma - precedex Lasix scheduled  - check bmet 8pm   .  Recent Labs  Lab 06/03/19 2203 06/04/19 0138 06/04/19 0500 06/05/19 0343 06/05/19 0454 06/05/19 0454 06/06/19 0409 06/06/19 0409 06/06/19 0437 06/06/19 0437 06/07/19 0301 06/07/19 0301 06/07/19 0425 06/07/19 0425 06/07/19 0946 06/07/19 1215  NA 153*   < > 154*   < > 148*   < > 145   < > 144  --  142  --  141  --  141 140  K 4.2   < > 4.0   < > 4.3   < > 4.8   < > 4.6   < > 5.4*   < > 5.2*   < > 5.3* 5.6*  CL 114*   < > 115*  --  109  --  110  --   --   --  107  --   --   --   --  106  CO2 28   < > 29  --  25  --  23  --   --   --  23  --   --   --   --  21*  GLUCOSE 195*   < > 128*  --  102*  --  117*  --   --   --  116*  --   --   --   --  108*  BUN 71*   < > 70*  --  67*  --  58*  --   --   --  55*  --   --   --   --  57*  CREATININE 1.38*   < > 1.39*  --  1.39*  --  1.33*  --   --   --  1.43*  --   --   --   --  1.51*  CALCIUM 9.8   < > 10.2  --  10.3  --  9.9  --   --   --  10.2  --   --   --   --  10.1  MG 1.8  --  1.9  --  1.9  --   --   --   --   --  1.8  --   --   --   --   --   PHOS  --   --  3.4  --  4.5  --   --   --   --   --  4.9*  --   --   --   --   --    < > = values in this interval not displayed.

## 2019-06-07 NOTE — Progress Notes (Signed)
Notified Dr Purnell Shoemaker that pt went into tachy arrythmia.  HR was up into mid 140s with no preceding events. EKG obtained while HR was 111 - ST with occasional PVCs. MD ordered BMET.

## 2019-06-07 NOTE — Progress Notes (Signed)
eLink Physician-Brief Progress Note Patient Name: Anne [name] DOB: 05/25/1955 MRN: [id number]   Date of Service  06/07/2019  HPI/Events of Note  K improving to 5.2, making urine. Covid ARDS.  eICU Interventions  Continue care.      Intervention Category Minor Interventions: Electrolytes abnormality - evaluation and management  Elmer Sow 06/07/2019, 5:16 AM

## 2019-06-07 NOTE — Progress Notes (Signed)
Notified Dr Purnell Shoemaker that pt was tachypneic and dysynchronous with vent, breath stacking. 1 mg versed given and 2 PRN fentanyl boluses with no response. MD ordered to give 4 mg versed bolus now. Will round next.

## 2019-06-07 NOTE — Progress Notes (Signed)
Pt extremely agitated, thrashing in bed.  No able to verbally orient patient at this time. RR 40s, HR 130-140s ST, sats 86-88%.  PRN versed 2mg  given for agitation at this time. Will continue to monitor.

## 2019-06-08 ENCOUNTER — Inpatient Hospital Stay (HOSPITAL_COMMUNITY): Payer: Medicaid Other

## 2019-06-08 LAB — PHOSPHORUS
Phosphorus: 5.9 mg/dL — ABNORMAL HIGH (ref 2.5–4.6)
Phosphorus: 5.2 mg/dL — ABNORMAL HIGH (ref 2.5–4.6)

## 2019-06-08 LAB — URINALYSIS, ROUTINE W REFLEX MICROSCOPIC
Bilirubin Urine: NEGATIVE
Glucose, UA: NEGATIVE mg/dL
Ketones, ur: NEGATIVE mg/dL
Nitrite: NEGATIVE
Protein, ur: 30 mg/dL — AB
RBC / HPF: >50 RBC/hpf — ABNORMAL HIGH (ref 0–5)
Specific Gravity, Urine: 1.012 (ref 1.005–1.030)
pH: 8 (ref 5.0–8.0)

## 2019-06-08 LAB — CBC WITH DIFFERENTIAL/PLATELET
Abs Immature Granulocytes: 0.53 10*3/uL — ABNORMAL HIGH (ref 0.00–0.07)
Basophils Absolute: 0.1 10*3/uL (ref 0.0–0.1)
Basophils Relative: 0 %
Eosinophils Absolute: 0.2 10*3/uL (ref 0.0–0.5)
Eosinophils Relative: 1 %
HCT: 33.5 % — ABNORMAL LOW (ref 36.0–46.0)
Hemoglobin: 10.5 g/dL — ABNORMAL LOW (ref 12.0–15.0)
Immature Granulocytes: 3 %
Lymphocytes Relative: 6 %
Lymphs Abs: 1 10*3/uL (ref 0.7–4.0)
MCH: 29.6 pg (ref 26.0–34.0)
MCHC: 31.3 g/dL (ref 30.0–36.0)
MCV: 94.4 fL (ref 80.0–100.0)
Monocytes Absolute: 1.4 10*3/uL — ABNORMAL HIGH (ref 0.1–1.0)
Monocytes Relative: 8 %
Neutro Abs: 14 10*3/uL — ABNORMAL HIGH (ref 1.7–7.7)
Neutrophils Relative %: 82 %
Platelets: 198 10*3/uL (ref 150–400)
RBC: 3.55 MIL/uL — ABNORMAL LOW (ref 3.87–5.11)
RDW: 15.6 % — ABNORMAL HIGH (ref 11.5–15.5)
WBC: 17.1 10*3/uL — ABNORMAL HIGH (ref 4.0–10.5)
nRBC: 0.4 % — ABNORMAL HIGH (ref 0.0–0.2)

## 2019-06-08 LAB — POCT I-STAT 7, (LYTES, BLD GAS, ICA,H+H)
Acid-Base Excess: 2 mmol/L (ref 0.0–2.0)
Acid-Base Excess: 3 mmol/L — ABNORMAL HIGH (ref 0.0–2.0)
Acid-base deficit: 1 mmol/L (ref 0.0–2.0)
Bicarbonate: 26.3 mmol/L (ref 20.0–28.0)
Bicarbonate: 28.1 mmol/L — ABNORMAL HIGH (ref 20.0–28.0)
Bicarbonate: 23.1 mmol/L (ref 20.0–28.0)
Calcium, Ion: 1.4 mmol/L (ref 1.15–1.40)
Calcium, Ion: 1.44 mmol/L — ABNORMAL HIGH (ref 1.15–1.40)
Calcium, Ion: 1.47 mmol/L — ABNORMAL HIGH (ref 1.15–1.40)
HCT: 29 % — ABNORMAL LOW (ref 36.0–46.0)
HCT: 32 % — ABNORMAL LOW (ref 36.0–46.0)
HCT: 31 % — ABNORMAL LOW (ref 36.0–46.0)
Hemoglobin: 9.9 g/dL — ABNORMAL LOW (ref 12.0–15.0)
Hemoglobin: 10.9 g/dL — ABNORMAL LOW (ref 12.0–15.0)
Hemoglobin: 10.5 g/dL — ABNORMAL LOW (ref 12.0–15.0)
O2 Saturation: 89 %
O2 Saturation: 92 %
O2 Saturation: 82 %
Patient temperature: 98.7
Potassium: 5.5 mmol/L — ABNORMAL HIGH (ref 3.5–5.1)
Potassium: 5.3 mmol/L — ABNORMAL HIGH (ref 3.5–5.1)
Potassium: 5.4 mmol/L — ABNORMAL HIGH (ref 3.5–5.1)
Sodium: 140 mmol/L (ref 135–145)
Sodium: 143 mmol/L (ref 135–145)
Sodium: 142 mmol/L (ref 135–145)
TCO2: 27 mmol/L (ref 22–32)
TCO2: 29 mmol/L (ref 22–32)
TCO2: 24 mmol/L (ref 22–32)
pCO2 arterial: 39.3 mmHg (ref 32.0–48.0)
pCO2 arterial: 43.2 mmHg (ref 32.0–48.0)
pCO2 arterial: 36.2 mmHg (ref 32.0–48.0)
pH, Arterial: 7.433 (ref 7.350–7.450)
pH, Arterial: 7.422 (ref 7.350–7.450)
pH, Arterial: 7.413 (ref 7.350–7.450)
pO2, Arterial: 55 mmHg — ABNORMAL LOW (ref 83.0–108.0)
pO2, Arterial: 64 mmHg — ABNORMAL LOW (ref 83.0–108.0)
pO2, Arterial: 46 mmHg — ABNORMAL LOW (ref 83.0–108.0)

## 2019-06-08 LAB — GLUCOSE, CAPILLARY
Glucose-Capillary: 146 mg/dL — ABNORMAL HIGH (ref 70–99)
Glucose-Capillary: 140 mg/dL — ABNORMAL HIGH (ref 70–99)
Glucose-Capillary: 117 mg/dL — ABNORMAL HIGH (ref 70–99)
Glucose-Capillary: 134 mg/dL — ABNORMAL HIGH (ref 70–99)
Glucose-Capillary: 133 mg/dL — ABNORMAL HIGH (ref 70–99)
Glucose-Capillary: 141 mg/dL — ABNORMAL HIGH (ref 70–99)

## 2019-06-08 LAB — BODY FLUID CELL COUNT WITH DIFFERENTIAL
Eos, Fluid: 0 %
Lymphs, Fluid: 1 %
Monocyte-Macrophage-Serous Fluid: 3 % — ABNORMAL LOW (ref 50–90)
Neutrophil Count, Fluid: 96 % — ABNORMAL HIGH (ref 0–25)
Total Nucleated Cell Count, Fluid: 6900 cu mm — ABNORMAL HIGH (ref 0–1000)

## 2019-06-08 LAB — COMPREHENSIVE METABOLIC PANEL
ALT: 92 U/L — ABNORMAL HIGH (ref 0–44)
AST: 41 U/L (ref 15–41)
Albumin: 2.3 g/dL — ABNORMAL LOW (ref 3.5–5.0)
Alkaline Phosphatase: 108 U/L (ref 38–126)
Anion gap: 12 (ref 5–15)
BUN: 68 mg/dL — ABNORMAL HIGH (ref 8–23)
CO2: 23 mmol/L (ref 22–32)
Calcium: 9.8 mg/dL (ref 8.9–10.3)
Chloride: 109 mmol/L (ref 98–111)
Creatinine, Ser: 2.06 mg/dL — ABNORMAL HIGH (ref 0.44–1.00)
GFR calc Af Amer: 29 mL/min — ABNORMAL LOW (ref >60)
GFR calc non Af Amer: 25 mL/min — ABNORMAL LOW (ref >60)
Glucose, Bld: 119 mg/dL — ABNORMAL HIGH (ref 70–99)
Potassium: 5.4 mmol/L — ABNORMAL HIGH (ref 3.5–5.1)
Sodium: 144 mmol/L (ref 135–145)
Total Bilirubin: 0.6 mg/dL (ref 0.3–1.2)
Total Protein: 6.6 g/dL (ref 6.5–8.1)

## 2019-06-08 LAB — MAGNESIUM
Magnesium: 2.5 mg/dL — ABNORMAL HIGH (ref 1.7–2.4)
Magnesium: 2.2 mg/dL (ref 1.7–2.4)

## 2019-06-08 LAB — PROCALCITONIN: Procalcitonin: 0.33 ng/mL

## 2019-06-08 LAB — D-DIMER, QUANTITATIVE: D-Dimer, Quant: 1.24 ug/mL-FEU — ABNORMAL HIGH (ref 0.00–0.50)

## 2019-06-08 MED ORDER — VANCOMYCIN HCL 2000 MG/400ML IV SOLN
2000.0000 mg | Freq: Once | INTRAVENOUS | Status: AC
  Administered 2019-06-08: 2000 mg via INTRAVENOUS
  Filled 2019-06-08: qty 400

## 2019-06-08 MED ORDER — SODIUM CHLORIDE 0.9 % IV SOLN
2.0000 g | Freq: Two times a day (BID) | INTRAVENOUS | Status: DC
  Administered 2019-06-08 – 2019-06-10 (×6): 2 g via INTRAVENOUS
  Filled 2019-06-08 (×6): qty 2

## 2019-06-08 MED ORDER — IPRATROPIUM-ALBUTEROL 0.5-2.5 (3) MG/3ML IN SOLN
3.0000 mL | Freq: Two times a day (BID) | RESPIRATORY_TRACT | Status: DC
  Administered 2019-06-08 – 2019-06-13 (×11): 3 mL via RESPIRATORY_TRACT
  Filled 2019-06-08 (×11): qty 3

## 2019-06-08 MED ORDER — SENNOSIDES 8.8 MG/5ML PO SYRP
5.0000 mL | ORAL_SOLUTION | Freq: Every day | ORAL | Status: DC
  Administered 2019-06-08 – 2019-06-12 (×3): 5 mL
  Filled 2019-06-08 (×4): qty 5

## 2019-06-08 MED ORDER — SODIUM CHLORIDE 0.9 % IV SOLN: 2.0000 g | Freq: Two times a day (BID) | INTRAVENOUS | Status: DC

## 2019-06-08 MED ORDER — LACTATED RINGERS IV BOLUS
500.0000 mL | Freq: Once | INTRAVENOUS | Status: AC
  Administered 2019-06-08: 500 mL via INTRAVENOUS

## 2019-06-08 MED ORDER — POLYETHYLENE GLYCOL 3350 17 G PO PACK
17.0000 g | PACK | Freq: Every day | ORAL | Status: DC
  Administered 2019-06-08: 17 g
  Filled 2019-06-08: qty 1

## 2019-06-08 MED ORDER — VANCOMYCIN HCL 1250 MG/250ML IV SOLN
1250.0000 mg | INTRAVENOUS | Status: DC
  Filled 2019-06-08: qty 250

## 2019-06-08 MED ORDER — STERILE WATER FOR INJECTION IJ SOLN
INTRAMUSCULAR | Status: AC
  Administered 2019-06-08: 10 mL
  Filled 2019-06-08: qty 10

## 2019-06-08 MED ORDER — HEPARIN (PORCINE) 25000 UT/250ML-% IV SOLN
600.0000 [IU]/h | INTRAVENOUS | Status: DC
  Administered 2019-06-08: 600 [IU]/h via INTRAVENOUS
  Filled 2019-06-08: qty 250

## 2019-06-08 MED ORDER — VECURONIUM BROMIDE 10 MG IV SOLR
INTRAVENOUS | Status: AC
  Administered 2019-06-08: 10 mg
  Filled 2019-06-08: qty 10

## 2019-06-08 NOTE — Progress Notes (Signed)
Notified Dr Vaughan Browner that pt was tachycardic into 140s. Per vital signs looks like tachycardia started around 10pm yesterday and has progressively increased throughout evening. Does not seem to be a sedation issue as PRN boluses do not resolve tachycardia. Lungs coarse, pt with low grade fever of 100.0 and tan colored sputum from ET tube.  MD ordered 1 time 500 mL LR bolus.  Will enter orders for cultures for infectious workup.

## 2019-06-08 NOTE — Progress Notes (Signed)
Pharmacy Antibiotic Note  Anne [name] is a 64 y.o. female admitted on 06/21/2019 with pneumonia.  Pharmacy has been consulted for Cefepime and vancomycin dosing. Patient has worsening leukocytosis with increasing respiratory requirements   Plan: -Start Cefepime 2 gm IV Q 12 hours -Vancomycin 2 gm IV load, then Vancomycin 1250 mg IV Q 48 hrs. Goal AUC 400-550. Expected AUC: 501 SCr used: 1.88 -F/u culture results -Monitor CBC, renal fx, and clinical progress    Height: 5' 2.99" (160 cm) Weight: 214 lb 8.1 oz (97.3 kg) IBW/kg (Calculated) : 52.38  Temp (24hrs), Avg:99.5 F (37.5 C), Min:98.5 F (36.9 C), Max:100.9 F (38.3 C)  Recent Labs  Lab 06/02/19 1030 06/02/19 1808 06/04/19 0500 06/04/19 0500 06/05/19 0454 06/06/19 0409 06/07/19 0301 06/07/19 1215 06/07/19 2028 06/08/19 0435  WBC  --    < > 16.5*  --  15.9* 12.6* 14.0*  --   --  17.1*  CREATININE  --    < > 1.39*   < > 1.39* 1.33* 1.43* 1.51* 1.88*  --   LATICACIDVEN 1.9  --   --   --   --   --   --   --   --   --    < > = values in this interval not displayed.    Estimated Creatinine Clearance: 34.5 mL/min (A) (by C-G formula based on SCr of 1.88 mg/dL (H)).    Allergies  Allergen Reactions  . Ibuprofen Other (See Comments)    Daughter unsure of this- patient unable to comment  . Nitrofuran Derivatives Other (See Comments)    Daughter unsure of this- patient unable to comment  . Ribavirin Nausea And Vomiting    Cefepime 3/2 >> 3/3, restart 3/6 >>3/12 Doxy 3/2 >> 3/3, restarted 3/6 >>3/8 3/15 Cefepime >> 3/15 Vanc >>    2/27 C.diff neg 2/27 BCx - negative 2/27 MRSA PCR pos  Thank you for allowing pharmacy to be a part of this patient's care.  Albertina Parr, PharmD., BCPS Clinical Pharmacist Clinical phone for 06/08/19 until 3:30pm: [phone] If after 3:30pm, please refer to [facility] for unit-specific pharmacist

## 2019-06-08 NOTE — Progress Notes (Signed)
Notified Dr Vaughan Browner that pts oxygen saturations were still in mid 80s despite him increasing O2.  MD ordered to increase FiO2 to 80% and give PRN versed now despite being early.

## 2019-06-08 NOTE — Progress Notes (Signed)
ANTICOAGULATION CONSULT NOTE - Initial Consult  Pharmacy Consult for Heparin  Indication: rule out PE  Patient Measurements: Height: 5' 2.99" (160 cm) Weight: 214 lb 8.1 oz (97.3 kg) IBW/kg (Calculated) : 52.38 Heparin Dosing Weight: 75 kg  Vital Signs: Temp: 103 F (39.4 C) (03/15 1600) Temp Source: Oral (03/15 1600) BP: 97/62 (03/15 1815) Pulse Rate: 97 (03/15 1830)  Labs: Recent Labs    06/06/19 0409 06/06/19 0437 06/07/19 0301 06/07/19 0425 06/07/19 1215 06/07/19 2028 06/08/19 0335 06/08/19 0435 06/08/19 0435 06/08/19 1009 06/08/19 1222 06/08/19 1543  HGB 9.7*   < > 10.2*   < >  --   --    < > 10.5*   < > 10.9*  --  10.5*  HCT 31.3*   < > 32.9*   < >  --   --    < > 33.5*  --  32.0*  --  31.0*  PLT 157  --  182  --   --   --   --  198  --   --   --   --   CREATININE 1.33*   < > 1.43*   < > 1.51* 1.88*  --   --   --   --  2.06*  --    < > = values in this interval not displayed.    Estimated Creatinine Clearance: 31.5 mL/min (A) (by C-G formula based on SCr of 2.06 mg/dL (H)).   Assessment: 44 YOF with COVID and persistent de-sats and re-starting full-dose Heparin for rule out PE.  The patient was on Heparin earlier this admission and was noted to be therapeutic at rates of 450-600 units/hr. The patient received a dose of Heparin 5000 units SQ at 1430 today - will restart at the previously known therapeutic rate.   Goal of Therapy:  Heparin level 0.3-0.7 units/ml Monitor platelets by anticoagulation protocol: Yes   Plan:  - D/c SQ Heparin - Restart Heparin IV at 600 units/hr (6 ml/hr) - Will continue to monitor for any signs/symptoms of bleeding and will follow up with heparin level in 6 hours   Thank you for allowing pharmacy to be a part of this patient's care.  Alycia Rossetti, PharmD, BCPS Clinical Pharmacist Clinical phone for 06/08/2019: [phone] 06/08/2019 6:44 PM   **Pharmacist phone directory can now be found on [email or web address] ([email or web address]).  Listed  under [place].

## 2019-06-08 NOTE — Progress Notes (Signed)
Pt again very restless and agitated , thrashing in bed.  Unable to reorient to surroundings.  sats 86% with agitation, PRN fentanyl bolus given, will continue to monitor.

## 2019-06-08 NOTE — Progress Notes (Signed)
Pt proned at this time due to low spo2. MD at bedside to assist. Pt with increased agonal breathing, spo2 < 75%.Pt with copious amount of brown thin liquid out of mouth upon proning. Pt OG tube connected to suction and copious amounts suctioned out of stomach. MD made aware. Pt ett securely taped with cloth tape in proper position. RT will continue to monitor

## 2019-06-08 NOTE — Progress Notes (Signed)
MD paged and notified about continued hemodynamic decompensation, hypoxia and increasing RR despite 100% Fi02 12 Peep. MD to bedside, paralytics given and CXR ordered. See new ABG. RN, RT and Agricultural consultant at bedside.

## 2019-06-08 NOTE — Progress Notes (Addendum)
PCCM progress note  Patient has become increasingly unstable, hypoxic through the day with sats in the mid 80s ABG 7.41/36/46/82%, chest x-ray with persistent bilateral infiltates Bedside bronchoscopy with no mucous plugging or reversible cause for hypoxia  We will check D-dimer, start heparin for empiric treatment of PE Increase FiO2 to 100%, PEEP to 16, continue 6 cc/kg ventilation Intermittent paralytic for vent dysynchrony Suspect this is just progression of ARDS from COVID-19 pneumonia  Spoke with my colleagues. She is not a candidate for ECMO Will try proning. Can try inhaled NO if proning does not work Code status change to limited code with no CPR or defib, no code blue  Discussed with family (son and daughter) in detail. If no improvement in 24 hrs then consider comfort measures.     The patient is critically ill with multiple organ system failure and requires high complexity decision making for assessment and support, frequent evaluation and titration of therapies, advanced monitoring, review of radiographic studies and interpretation of complex data.   Critical Care Time devoted to patient care services, exclusive of separately billable procedures, described in this note is 35 minutes.   Marshell Garfinkel MD [place] [facility] Please see [email or web address] for pager details.  06/08/2019, 4:45 PM

## 2019-06-08 NOTE — Progress Notes (Signed)
Notified Dr Vaughan Browner that pt was increasingly tachypneic with sats in mid 80s.  No relief when boluses given.  MD increased Fio2. No new orders at this time.

## 2019-06-08 NOTE — Progress Notes (Signed)
Per Dr Vaughan Browner decreased VT to 6 cc/kg to 310. Increased fio2 to 70% due to AM ABG. Will obtain ABG in 1 hour to monitor vent changes

## 2019-06-08 NOTE — Progress Notes (Signed)
Notified Dr Vaughan Browner that pts tachycardia was responsive to LR bolus. MD ordered additional 500 mL LR for total of 1L.

## 2019-06-08 NOTE — Plan of Care (Signed)
  Problem: Clinical Measurements: Goal: Ability to maintain clinical measurements within normal limits will improve Outcome: Progressing Goal: Will remain free from infection Outcome: Progressing Goal: Cardiovascular complication will be avoided Outcome: Progressing   Problem: Activity: Goal: Risk for activity intolerance will decrease Outcome: Progressing   Problem: Nutrition: Goal: Adequate nutrition will be maintained Outcome: Progressing   Problem: Coping: Goal: Level of anxiety will decrease Outcome: Progressing   Problem: Elimination: Goal: Will not experience complications related to bowel motility Outcome: Progressing Goal: Will not experience complications related to urinary retention Outcome: Progressing   Problem: Pain Managment: Goal: General experience of comfort will improve Outcome: Progressing   Problem: Safety: Goal: Ability to remain free from injury will improve Outcome: Progressing   Problem: Skin Integrity: Goal: Risk for impaired skin integrity will decrease Outcome: Progressing   Problem: Coping: Goal: Psychosocial and spiritual needs will be supported Outcome: Progressing   Problem: Respiratory: Goal: Will maintain a patent airway Outcome: Progressing Goal: Complications related to the disease process, condition or treatment will be avoided or minimized Outcome: Progressing

## 2019-06-08 NOTE — Progress Notes (Signed)
NAME:  Anne [name], MRN:  [id number], DOB:  [birth year], LOS: 12 ADMISSION DATE:  05/26/2019 REFERRING MD:  Dr. Patsey Berthold, [facility], CHIEF COMPLAINT:  Short of breath   Brief History   64 yo female with hypoxia from COVID 19 pneumonia with ARDS.  Transferred from [facility] 3/03.  Past Medical History  Hep C, COPD, Diastolic CHF, HTN  Significant Hospital Events   2/27- Admission to [place] unit 2/27- Transfer to Stepdown due to hypotension, increasing FiO2 requirements, metabolic acidosis requiring Bicarb gtt 2/28- intubated, initiated mechanical ventilation. Central venous access right femoral placed 3/01- continued issues with oxygenation despite mechanical ventilation, placed in prone position 3/02- oxygenation remains tenuous, resumed supine position, recruitment maneuvers initiated, patient tolerating, initiating potential transfer to [facility] unit 65M, spoke with Dr. Kara Mead 3/03-[place] 65M unit as bed available for patient will transfer.  Oxygen requirements are decreasing. 3/03: Admitted to [place] 3/4: No overnight events 3/7 persistent hypoxemia despite diuresis.  3/12 - Setback overnight. On higher FiO2 now (60%) after spells of agitation and coughing caused desaturations.  3/14 vent dysynchrony, desats  Consults:    Procedures:  ETT 2/28 >>  Lt PICC 3/05 >>   Significant Diagnostic Tests:  2/27- CT Renal Stone Study>> 1. Left nephrolithiasis, without obstructive uropathy. 2. Bibasilar peripheral ground-glass opacities, consistent with COVID-19 pneumonia. More focal nodular opacity in the left lower lobe could represent an area of consolidation or a true pulmonary nodule. Recommend follow-up chest CT at 3 months, after the patient is recovered from the acute illness. 3. Suspicion of cirrhosis, especially given the history of hepatitis B and C. 4. Aortic Atherosclerosis 2D Echocardiogram 3/1: LVEF 70 to 75%.  Hyperdynamic left ventricular function, left  concentric ventricular hypertrophy, diastology normal normal right ventricular function.  Normal valve function  Micro Data:  SARS CoV2 2/27 >> positive Bllood 2/27 - neg c dff 2/27 -neg MRSA pcr 2/27 - positive  Antimicrobials:  Doxy 3/6 - 3/8 Cefepime 3/06 > 3/13 Remdesivir 3/4 - 3/4 Decadron  Interim history/subjective:   Worsening O2 requirements, has copious secretions, BP soft today morning tachycardia Given IV fluid bolus with improvement.  Objective   Blood pressure 94/62, pulse (!) 109, temperature 100 F (37.8 C), temperature source Oral, resp. rate (!) 26, height 5' 2.99" (1.6 m), weight 97.3 kg, SpO2 99 %.    Vent Mode: PRVC FiO2 (%):  [50 %-70 %] 70 % Set Rate:  [12 bmp] 12 bmp Vt Set:  [310 mL-420 mL] 310 mL PEEP:  [8 cmH20] 8 cmH20   Intake/Output Summary (Last 24 hours) at 06/08/2019 0945 Last data filed at 06/08/2019 0900 Gross per 24 hour  Intake 1887.25 ml  Output 3540 ml  Net -1652.75 ml   Filed Weights   06/05/19 0500 06/07/19 0445 06/08/19 0451  Weight: 99.9 kg 98.6 kg 97.3 kg    Examination:  Gen:      No acute distress, obese HEENT:  EOMI, sclera anicteric, ETT Neck:     No masses; no thyromegaly Lungs:    Clear to auscultation bilaterally; normal respiratory effort CV:         Regular rate and rhythm; no murmurs Abd:      + bowel sounds; soft, non-tender; no palpable masses, no distension Ext:    No edema; adequate peripheral perfusion Skin:      Warm and dry; no rash Neuro: Sedated, unresponsive  Potassium 4.5, creatinine 1.88, WBC 17.1, hemoglobin 10.5 No new imaging  Resolved  Hospital Problem list   Elevated troponin from demand ischemia, Bradycardia, Septic shock (HCAP/COVID),   Assessment & Plan:   Acute hypoxic respiratory failure with ARDS from COVID 19 pneumonia complicated by HCAP. Hx of COPD. Continue vent support Goal plateau pressure less than 30, driving pressure less than 15 Concern for new HAP given worsening  oxygenation, leukocytosis and secretions Start Vanco, cefepime Check cultures Likely heading for tracheostomy later this week.  Will discuss with family  AKI from ATN 2nd to hypoxia and sepsis. Hyperkalemia. Given lokelma 3/14 IV fluids Recheck labs.  Cool lower extremities.  Off heparin.  Monitor  Hyperglycemia.  SSI  Acute metabolic encephalopathy from hypoxia, sepsis. Sedated for vent dyssynchrony Fentanyl, Precedex  Best practice:  Diet: Tube feeds DVT prophylaxis: Heparin 5000 tid GI prophylaxis: Protonix Mobility: bed rest Code Status: full code Family: - by Dr Chase Caller on 06/07/2019 , 10:05 AM - updated about AKI and ventilator dependence. She is agreeable for trach Disposition: ICU  The patient is critically ill with multiple organ system failure and requires high complexity decision making for assessment and support, frequent evaluation and titration of therapies, advanced monitoring, review of radiographic studies and interpretation of complex data.   Critical Care Time devoted to patient care services, exclusive of separately billable procedures, described in this note is 35 minutes.   Marshell Garfinkel MD [place] [facility] Please see [email or web address] for pager details.  06/08/2019, 9:46 AM

## 2019-06-08 NOTE — Progress Notes (Signed)
Patient very agitated, sitting up in bed, does not respond to verbal redirection, does not acknowledge named being called.  Fentanyl bolus given per PRN orders based on RASS assessment.

## 2019-06-08 NOTE — Procedures (Signed)
Bronchoscopy Procedure Note Lorisa Scheid [id number] [birth year]  Procedure: Bronchoscopy Indications: Diagnostic evaluation of the airways and Obtain specimens for culture and/or other diagnostic studies  Procedure Details Consent: Risks of procedure as well as the alternatives and risks of each were explained to the (patient/caregiver).  Consent for procedure obtained. Time Out: Verified patient identification, verified procedure, site/side was marked, verified correct patient position, special equipment/implants available, medications/allergies/relevent history reviewed, required imaging and test results available.  Performed  In preparation for procedure, patient was given 100% FiO2 and bronchoscope lubricated. Sedation: Benzodiazepines  Airway entered and the following bronchi were examined: RUL, RML, RLL, LUL, LLL and Bronchi.   Airways are clear no mucus or bronchial obstruction.  No other abnormalities noted.   BAL performed in the right lower lobe and right middle lobe.  20 cc aliquots x3 with return of bloody fluid Sent for cell count and culture.  Bronchoscope removed.  , Patient placed back on 100% FiO2 at conclusion of procedure.    Evaluation Hemodynamic Status: BP stable throughout; O2 sats: persistently hypoxic Patient's Current Condition: unstable Complications: No apparent complications Patient did tolerate procedure well.  Marshell Garfinkel MD [place] [facility] Please see [email or web address] for pager details.  06/08/2019, 4:39 PM

## 2019-06-09 ENCOUNTER — Inpatient Hospital Stay (HOSPITAL_COMMUNITY): Payer: Medicaid Other

## 2019-06-09 LAB — POCT I-STAT 7, (LYTES, BLD GAS, ICA,H+H)
Acid-base deficit: 1 mmol/L (ref 0.0–2.0)
Acid-base deficit: 2 mmol/L (ref 0.0–2.0)
Bicarbonate: 24.7 mmol/L (ref 20.0–28.0)
Bicarbonate: 24.1 mmol/L (ref 20.0–28.0)
Calcium, Ion: 1.4 mmol/L (ref 1.15–1.40)
Calcium, Ion: 1.36 mmol/L (ref 1.15–1.40)
HCT: 30 % — ABNORMAL LOW (ref 36.0–46.0)
HCT: 28 % — ABNORMAL LOW (ref 36.0–46.0)
Hemoglobin: 10.2 g/dL — ABNORMAL LOW (ref 12.0–15.0)
Hemoglobin: 9.5 g/dL — ABNORMAL LOW (ref 12.0–15.0)
O2 Saturation: 92 %
O2 Saturation: 95 %
Patient temperature: 98.1
Potassium: 6.4 mmol/L (ref 3.5–5.1)
Potassium: 5.6 mmol/L — ABNORMAL HIGH (ref 3.5–5.1)
Sodium: 144 mmol/L (ref 135–145)
Sodium: 148 mmol/L — ABNORMAL HIGH (ref 135–145)
TCO2: 26 mmol/L (ref 22–32)
TCO2: 25 mmol/L (ref 22–32)
pCO2 arterial: 42 mmHg (ref 32.0–48.0)
pCO2 arterial: 45.8 mmHg (ref 32.0–48.0)
pH, Arterial: 7.377 (ref 7.350–7.450)
pH, Arterial: 7.329 — ABNORMAL LOW (ref 7.350–7.450)
pO2, Arterial: 65 mmHg — ABNORMAL LOW (ref 83.0–108.0)
pO2, Arterial: 80 mmHg — ABNORMAL LOW (ref 83.0–108.0)

## 2019-06-09 LAB — NA AND K (SODIUM & POTASSIUM), RAND UR
Potassium Urine: 36 mmol/L
Sodium, Ur: 45 mmol/L

## 2019-06-09 LAB — CBC WITH DIFFERENTIAL/PLATELET
Abs Immature Granulocytes: 0.6 10*3/uL — ABNORMAL HIGH (ref 0.00–0.07)
Basophils Absolute: 0.1 10*3/uL (ref 0.0–0.1)
Basophils Relative: 0 %
Eosinophils Absolute: 0 10*3/uL (ref 0.0–0.5)
Eosinophils Relative: 0 %
HCT: 29.4 % — ABNORMAL LOW (ref 36.0–46.0)
Hemoglobin: 9.1 g/dL — ABNORMAL LOW (ref 12.0–15.0)
Immature Granulocytes: 2 %
Lymphocytes Relative: 3 %
Lymphs Abs: 1.2 10*3/uL (ref 0.7–4.0)
MCH: 29.7 pg (ref 26.0–34.0)
MCHC: 31 g/dL (ref 30.0–36.0)
MCV: 96.1 fL (ref 80.0–100.0)
Monocytes Absolute: 1.4 10*3/uL — ABNORMAL HIGH (ref 0.1–1.0)
Monocytes Relative: 4 %
Neutro Abs: 32.9 10*3/uL — ABNORMAL HIGH (ref 1.7–7.7)
Neutrophils Relative %: 91 %
Platelets: 203 10*3/uL (ref 150–400)
RBC: 3.06 MIL/uL — ABNORMAL LOW (ref 3.87–5.11)
RDW: 15.3 % (ref 11.5–15.5)
WBC: 36.2 10*3/uL — ABNORMAL HIGH (ref 4.0–10.5)
nRBC: 0.1 % (ref 0.0–0.2)

## 2019-06-09 LAB — POTASSIUM
Potassium: 5.7 mmol/L — ABNORMAL HIGH (ref 3.5–5.1)
Potassium: 5.4 mmol/L — ABNORMAL HIGH (ref 3.5–5.1)
Potassium: 6 mmol/L — ABNORMAL HIGH (ref 3.5–5.1)

## 2019-06-09 LAB — BASIC METABOLIC PANEL
Anion gap: 12 (ref 5–15)
Anion gap: 9 (ref 5–15)
BUN: 71 mg/dL — ABNORMAL HIGH (ref 8–23)
BUN: 69 mg/dL — ABNORMAL HIGH (ref 8–23)
CO2: 21 mmol/L — ABNORMAL LOW (ref 22–32)
CO2: 20 mmol/L — ABNORMAL LOW (ref 22–32)
Calcium: 9.1 mg/dL (ref 8.9–10.3)
Calcium: 8.6 mg/dL — ABNORMAL LOW (ref 8.9–10.3)
Chloride: 113 mmol/L — ABNORMAL HIGH (ref 98–111)
Chloride: 117 mmol/L — ABNORMAL HIGH (ref 98–111)
Creatinine, Ser: 2.17 mg/dL — ABNORMAL HIGH (ref 0.44–1.00)
Creatinine, Ser: 2.05 mg/dL — ABNORMAL HIGH (ref 0.44–1.00)
GFR calc Af Amer: 27 mL/min — ABNORMAL LOW (ref >60)
GFR calc Af Amer: 29 mL/min — ABNORMAL LOW (ref >60)
GFR calc non Af Amer: 24 mL/min — ABNORMAL LOW (ref >60)
GFR calc non Af Amer: 25 mL/min — ABNORMAL LOW (ref >60)
Glucose, Bld: 156 mg/dL — ABNORMAL HIGH (ref 70–99)
Glucose, Bld: 136 mg/dL — ABNORMAL HIGH (ref 70–99)
Potassium: 5.9 mmol/L — ABNORMAL HIGH (ref 3.5–5.1)
Potassium: 5.7 mmol/L — ABNORMAL HIGH (ref 3.5–5.1)
Sodium: 146 mmol/L — ABNORMAL HIGH (ref 135–145)
Sodium: 146 mmol/L — ABNORMAL HIGH (ref 135–145)

## 2019-06-09 LAB — GLUCOSE, CAPILLARY
Glucose-Capillary: 157 mg/dL — ABNORMAL HIGH (ref 70–99)
Glucose-Capillary: 138 mg/dL — ABNORMAL HIGH (ref 70–99)
Glucose-Capillary: 123 mg/dL — ABNORMAL HIGH (ref 70–99)
Glucose-Capillary: 118 mg/dL — ABNORMAL HIGH (ref 70–99)
Glucose-Capillary: 97 mg/dL (ref 70–99)
Glucose-Capillary: 102 mg/dL — ABNORMAL HIGH (ref 70–99)
Glucose-Capillary: 115 mg/dL — ABNORMAL HIGH (ref 70–99)

## 2019-06-09 LAB — PATHOLOGIST SMEAR REVIEW

## 2019-06-09 LAB — MAGNESIUM: Magnesium: 2 mg/dL (ref 1.7–2.4)

## 2019-06-09 LAB — PHOSPHORUS: Phosphorus: 5 mg/dL — ABNORMAL HIGH (ref 2.5–4.6)

## 2019-06-09 LAB — HEPARIN LEVEL (UNFRACTIONATED): Heparin Unfractionated: 0.65 IU/mL (ref 0.30–0.70)

## 2019-06-09 MED ORDER — NOREPINEPHRINE 16 MG/250ML-% IV SOLN
0.0000 ug/min | INTRAVENOUS | Status: DC
  Administered 2019-06-09: 7 ug/min via INTRAVENOUS
  Administered 2019-06-11: 6 ug/min via INTRAVENOUS
  Administered 2019-06-12: 8 ug/min via INTRAVENOUS
  Filled 2019-06-09 (×6): qty 250

## 2019-06-09 MED ORDER — FAMOTIDINE IN NACL 20-0.9 MG/50ML-% IV SOLN
20.0000 mg | INTRAVENOUS | Status: DC
  Administered 2019-06-09 – 2019-06-11 (×3): 20 mg via INTRAVENOUS
  Filled 2019-06-09 (×3): qty 50

## 2019-06-09 MED ORDER — PRO-STAT SUGAR FREE PO LIQD
60.0000 mL | Freq: Two times a day (BID) | ORAL | Status: DC
  Administered 2019-06-09 – 2019-06-13 (×7): 60 mL
  Filled 2019-06-09 (×7): qty 60

## 2019-06-09 MED ORDER — SODIUM POLYSTYRENE SULFONATE 15 GM/60ML PO SUSP
30.0000 g | Freq: Once | ORAL | Status: AC
  Administered 2019-06-09: 30 g via ORAL
  Filled 2019-06-09: qty 120

## 2019-06-09 MED ORDER — FUROSEMIDE 10 MG/ML IJ SOLN
40.0000 mg | Freq: Once | INTRAMUSCULAR | Status: AC
  Administered 2019-06-09: 40 mg via INTRAVENOUS
  Filled 2019-06-09: qty 4

## 2019-06-09 MED ORDER — HEPARIN SODIUM (PORCINE) 5000 UNIT/ML IJ SOLN
5000.0000 [IU] | Freq: Three times a day (TID) | INTRAMUSCULAR | Status: DC
  Administered 2019-06-09 – 2019-06-13 (×14): 5000 [IU] via SUBCUTANEOUS
  Filled 2019-06-09 (×14): qty 1

## 2019-06-09 NOTE — Progress Notes (Signed)
Pt unproned at this time with no complications. Pt stable throughout, VS within normal limits. ETT re-secured with commercial tube holder in proper position. No breakdown noted. RT will continue to monitor

## 2019-06-09 NOTE — Progress Notes (Signed)
Turned head to left side per protocol for prone patients

## 2019-06-09 NOTE — Progress Notes (Signed)
Patient vomiting dark brown emesis when turned head. Applied low intermittent suction to OGT and kept tube feeds off. Dark brown fluids suctioned from stomach.

## 2019-06-09 NOTE — Progress Notes (Signed)
Nutrition Follow-up  DOCUMENTATION CODES:   Obesity unspecified  INTERVENTION:   Once medically able re-start TF:  -Vital 1.5 @ 20 ml/hr -Increase by 10 ml Q6 hours to goal rate of 40 ml/hr (960 ml) -60 ml Prostat BID  At goal rate TF provides: 1840 kcals, 125 grams protein, 733 ml free water.   NUTRITION DIAGNOSIS:   Inadequate oral intake related to acute illness as evidenced by NPO status.  Ongoing  GOAL:   Provide needs based on ASPEN/SCCM guidelines  TF held   MONITOR:   Vent status, Weight trends, Labs, TF tolerance, Skin  REASON FOR ASSESSMENT:   Consult, Ventilator Enteral/tube feeding initiation and management  ASSESSMENT:   64 yo female admitted to [facility] with admission for acute respiratory failure secondary to COVID-19 pneumonia requiring intubation [id number], metabolic acidosis, AKI. PMH includes CHF, COPD, hepatitis C  RD working remotely.  Pt proned this am, now unproned. Remains on pressor. TF held yesterday due to "feces like" vomit. Per RN, OGT to suction with 1500 ml output since last night. Will discuss getting KUB with MD. Consider Cortrak if KUB negative and vomiting persists. No BM in 3 days- regimen in place.   Admission weight: 105.7 kg  Current weight: 89 kg   Patient remains intubated on ventilator support MV: 10.2 L/min Temp (24hrs), Avg:99.2 F (37.3 C), Min:97.3 F (36.3 C), Max:103 F (39.4 C)   I/O: -3,650 ml since admit  UOP: 2,455 ml x 24 hrs  OGT: 400 mg x 24 hrs   Drips: precedex, levophed   Medications: SS novolog, lantus, MVI with minerals, miralax, senokot Labs: K 5.4 (L) Na 146 (H) Cr 2.05-trending down Phosphorus 5.0 (H)   Diet Order:   Diet Order    None      EDUCATION NEEDS:   Not appropriate for education at this time  Skin:  Skin Assessment: Reviewed RN Assessment  Last BM:  3/13  Height:   Ht Readings from Last 1 Encounters:  06/17/2019 5' 2.99" (1.6 m)    Weight:   Wt Readings  from Last 1 Encounters:  06/09/19 89 kg    Ideal Body Weight:  52.3 kg(Adjusted BW 72 kg)  BMI:  Body mass index is 34.77 kg/m.  Estimated Nutritional Needs:   Kcal:  [id number] kcal  Protein:  100-130 grams  Fluid:  >/= 1.7 L/day  Mariana Single RD, LDN Clinical Nutrition Pager listed in [place]

## 2019-06-09 NOTE — Progress Notes (Signed)
NAME:  Anne [name], MRN:  [id number], DOB:  03/26/1956, LOS: 19 ADMISSION DATE:  06/12/2019 REFERRING MD:  Dr. Patsey Berthold, [facility], CHIEF COMPLAINT:  Short of breath   Brief History   64 yo female with hypoxia from COVID 19 pneumonia with ARDS.  Transferred from [facility] 3/03.  Past Medical History  Hep C, COPD, Diastolic CHF, HTN  Significant Hospital Events   2/27- Admission to [facility] unit 2/27- Transfer to Stepdown due to hypotension, increasing FiO2 requirements, metabolic acidosis requiring Bicarb gtt 2/28- intubated, initiated mechanical ventilation. Central venous access right femoral placed 3/01- continued issues with oxygenation despite mechanical ventilation, placed in prone position 3/02- oxygenation remains tenuous, resumed supine position, recruitment maneuvers initiated, patient tolerating, initiating potential transfer to [facility] unit 62M, spoke with Dr. Kara Mead 3/03-[place] 62M unit as bed available for patient will transfer.  Oxygen requirements are decreasing. 3/03: Admitted to [place] 3/4: No overnight events 3/7 persistent hypoxemia despite diuresis.  3/12 - Setback overnight. On higher FiO2 now (60%) after spells of agitation and coughing caused desaturations.  3/14 vent dysynchrony, desats 3/15 Proned for progressive hypoxia, HAP  Consults:    Procedures:  ETT 2/28 >>  Lt PICC 3/05 >>   Significant Diagnostic Tests:  2/27- CT Renal Stone Study>> 1. Left nephrolithiasis, without obstructive uropathy. 2. Bibasilar peripheral ground-glass opacities, consistent with COVID-19 pneumonia. More focal nodular opacity in the left lower lobe could represent an area of consolidation or a true pulmonary nodule. Recommend follow-up chest CT at 3 months, after the patient is recovered from the acute illness. 3. Suspicion of cirrhosis, especially given the history of hepatitis B and C. 4. Aortic Atherosclerosis 2D Echocardiogram 3/1: LVEF 70 to 75%.   Hyperdynamic left ventricular function, left concentric ventricular hypertrophy, diastology normal normal right ventricular function.  Normal valve function  Micro Data:  SARS CoV2 2/27 >> positive Bllood 2/27 - neg c dff 2/27 -neg MRSA pcr 2/27 - positive  Bcx 3/15- Aerococcus Sp Cx 3/15- GPC, GNR BAL 3/15- Pending  Antimicrobials:  Doxy 3/6 - 3/8 Cefepime 3/06 > 3/13 Remdesivir 3/4 - 3/4 Decadron  Vanco 3/16 >> Cefepime 3/16 >>  Interim history/subjective:   Has worsening resp status due to HAP, started antibiotics and proned yesterday.  K still high but has good urine output.  Objective   Blood pressure (!) 112/92, pulse 74, temperature 98.1 F (36.7 C), temperature source Oral, resp. rate (!) 21, height 5' 2.99" (1.6 m), weight 89 kg, SpO2 97 %.    Vent Mode: PRVC FiO2 (%):  [60 %-100 %] 100 % Set Rate:  [12 bmp] 12 bmp Vt Set:  [310 mL] 310 mL PEEP:  [8 cmH20-16 cmH20] 16 cmH20   Intake/Output Summary (Last 24 hours) at 06/09/2019 0833 Last data filed at 06/09/2019 0700 Gross per 24 hour  Intake 3198.32 ml  Output 2705 ml  Net 493.32 ml   Filed Weights   06/07/19 0445 06/08/19 0451 06/09/19 0424  Weight: 98.6 kg 97.3 kg 89 kg    Examination:  Gen:      No acute distress, prone HEENT:  EOMI, sclera anicteric Neck:     No masses; no thyromegaly, ETT Lungs:    Clear to auscultation bilaterally; normal respiratory effort CV:         Regular rate and rhythm; no murmurs Abd:      + bowel sounds; soft, non-tender; no palpable masses, no distension Ext:    No edema; adequate peripheral  perfusion Skin:      Warm and dry; no rash Neuro: Sedated  K 5.7, Cr 2.05, WBC 36.2, Hb 9.1 CXR- persistent b/l infiltrates   Resolved Hospital Problem list   Elevated troponin from demand ischemia, Bradycardia, Septic shock (HCAP/COVID),   Assessment & Plan:   Acute hypoxic respiratory failure with ARDS from COVID 19 pneumonia complicated by HCAP. Hx of COPD. Continue  vent support Goal plateau pressure less than 30, driving pressure less than 15 Concern for new HAP given worsening oxygenation, leukocytosis and secretions Abx Check cultures Too unstable for trach at this point. Will reassess later this week  Sepsis, not present on admission Source -  HAP pneumonia Wean levo as tolerated Follow final cultures. Continue vanco, cefepime  AKI from ATN 2nd to hypoxia and sepsis. Hyperkalemia. Repeat kayexelate Follow labs  Cool lower extremities.  Stop heparin as d dimer is low. Low suspicion for PE  Hyperglycemia.  SSI  Acute metabolic encephalopathy from hypoxia, sepsis. Sedated for vent dyssynchrony Intermittent paralytics Fentanyl, Precedex  Best practice:  Diet: Tube feeds DVT prophylaxis: Heparin 5000 tid GI prophylaxis: Protonix Mobility: bed rest Code Status: full code Family: Updated 3/15 Disposition: ICU  The patient is critically ill with multiple organ system failure and requires high complexity decision making for assessment and support, frequent evaluation and titration of therapies, advanced monitoring, review of radiographic studies and interpretation of complex data.   Critical Care Time devoted to patient care services, exclusive of separately billable procedures, described in this note is 35 minutes.   Marshell Garfinkel MD [place] [facility] Please see [email or web address] for pager details.  06/09/2019, 8:33 AM

## 2019-06-09 NOTE — Progress Notes (Signed)
[facility] Progress Note Patient Name: Anne [name] DOB: Oct 29, 1955 MRN: [id number]   Date of Service  06/09/2019  HPI/Events of Note  K+ 6.4  eICU Interventions  Mild Hyperkalemia protocol orders entered.        Kerry Kass Reonna Finlayson 06/09/2019, 4:56 AM

## 2019-06-09 NOTE — Progress Notes (Signed)
Head moved and arms adjusted per protocol

## 2019-06-09 NOTE — Progress Notes (Signed)
Turned Head to the right per protocol

## 2019-06-09 NOTE — Progress Notes (Signed)
ANTICOAGULATION CONSULT NOTE  Pharmacy Consult for Heparin  Indication: r/o  PE  Patient Measurements: Height: 5' 2.99" (160 cm) Weight: 214 lb 8.1 oz (97.3 kg) IBW/kg (Calculated) : 52.38 Heparin Dosing Weight: 75 kg  Vital Signs: Temp: 98.5 F (36.9 C) (03/15 2000) Temp Source: Oral (03/15 2000) BP: 126/87 (03/16 0315) Pulse Rate: 110 (03/16 0315)  Labs: Recent Labs    06/07/19 0301 06/07/19 0425 06/07/19 2028 06/08/19 0335 06/08/19 0435 06/08/19 0435 06/08/19 1009 06/08/19 1009 06/08/19 1222 06/08/19 1543 06/09/19 0207  HGB 10.2*   < >  --    < > 10.5*   < > 10.9*   < >  --  10.5* 9.1*  HCT 32.9*   < >  --    < > 33.5*   < > 32.0*  --   --  31.0* 29.4*  PLT 182  --   --   --  198  --   --   --   --   --  203  HEPARINUNFRC  --   --   --   --   --   --   --   --   --   --  0.65  CREATININE 1.43*   < > 1.88*  --   --   --   --   --  2.06*  --  2.17*   < > = values in this interval not displayed.    Estimated Creatinine Clearance: 29.9 mL/min (A) (by C-G formula based on SCr of 2.17 mg/dL (H)).   Assessment: 64 yo female with COVID and persistent hypoxia, possible PE, for heparin.  Heparin level within goal range  Goal of Therapy:  Heparin level 0.3-0.7 units/ml Monitor platelets by anticoagulation protocol: Yes   Plan:  -Continue Heparin at current rate   Phillis Knack, PharmD, BCPS

## 2019-06-09 NOTE — Progress Notes (Signed)
PCCM progress note  Patient examined after  unproning Having copious output from NG tube ABG 7.33/46/80/95% on 80% FiO2  Chest x-ray with pneumomediastinum, stable bilateral opacities with no pneumothorax Abdominal x-ray with no ileus  We will not prone again due to pneumomediastinum and high NG output Continue monitoring  Daughter called and updated.  The patient is critically ill with multiple organ system failure and requires high complexity decision making for assessment and support, frequent evaluation and titration of therapies, advanced monitoring, review of radiographic studies and interpretation of complex data.   Critical Care Time devoted to patient care services, exclusive of separately billable procedures, described in this note is 35 minutes.   Marshell Garfinkel MD [facility] Please see [email or web address] for pager details.  06/09/2019, 5:24 PM

## 2019-06-09 NOTE — Progress Notes (Signed)
Head moved and arms adjusted per protocol for prone patients

## 2019-06-10 ENCOUNTER — Inpatient Hospital Stay (HOSPITAL_COMMUNITY): Payer: Medicaid Other

## 2019-06-10 LAB — BASIC METABOLIC PANEL
Anion gap: 13 (ref 5–15)
Anion gap: 14 (ref 5–15)
BUN: 82 mg/dL — ABNORMAL HIGH (ref 8–23)
BUN: 80 mg/dL — ABNORMAL HIGH (ref 8–23)
CO2: 19 mmol/L — ABNORMAL LOW (ref 22–32)
CO2: 20 mmol/L — ABNORMAL LOW (ref 22–32)
Calcium: 9.1 mg/dL (ref 8.9–10.3)
Calcium: 9.6 mg/dL (ref 8.9–10.3)
Chloride: 118 mmol/L — ABNORMAL HIGH (ref 98–111)
Chloride: 119 mmol/L — ABNORMAL HIGH (ref 98–111)
Creatinine, Ser: 2.06 mg/dL — ABNORMAL HIGH (ref 0.44–1.00)
Creatinine, Ser: 2.19 mg/dL — ABNORMAL HIGH (ref 0.44–1.00)
GFR calc Af Amer: 29 mL/min — ABNORMAL LOW (ref >60)
GFR calc Af Amer: 27 mL/min — ABNORMAL LOW (ref >60)
GFR calc non Af Amer: 25 mL/min — ABNORMAL LOW (ref >60)
GFR calc non Af Amer: 23 mL/min — ABNORMAL LOW (ref >60)
Glucose, Bld: 105 mg/dL — ABNORMAL HIGH (ref 70–99)
Glucose, Bld: 117 mg/dL — ABNORMAL HIGH (ref 70–99)
Potassium: 6.4 mmol/L (ref 3.5–5.1)
Potassium: 5.9 mmol/L — ABNORMAL HIGH (ref 3.5–5.1)
Sodium: 150 mmol/L — ABNORMAL HIGH (ref 135–145)
Sodium: 153 mmol/L — ABNORMAL HIGH (ref 135–145)

## 2019-06-10 LAB — GLUCOSE, CAPILLARY
Glucose-Capillary: 100 mg/dL — ABNORMAL HIGH (ref 70–99)
Glucose-Capillary: 106 mg/dL — ABNORMAL HIGH (ref 70–99)
Glucose-Capillary: 109 mg/dL — ABNORMAL HIGH (ref 70–99)
Glucose-Capillary: 101 mg/dL — ABNORMAL HIGH (ref 70–99)
Glucose-Capillary: 108 mg/dL — ABNORMAL HIGH (ref 70–99)
Glucose-Capillary: 172 mg/dL — ABNORMAL HIGH (ref 70–99)

## 2019-06-10 LAB — URINE CULTURE
Culture: 40000 — AB
Special Requests: NORMAL

## 2019-06-10 LAB — CBC WITH DIFFERENTIAL/PLATELET
Abs Immature Granulocytes: 0.13 10*3/uL — ABNORMAL HIGH (ref 0.00–0.07)
Basophils Absolute: 0 10*3/uL (ref 0.0–0.1)
Basophils Relative: 0 %
Eosinophils Absolute: 0.3 10*3/uL (ref 0.0–0.5)
Eosinophils Relative: 2 %
HCT: 27.4 % — ABNORMAL LOW (ref 36.0–46.0)
Hemoglobin: 8.3 g/dL — ABNORMAL LOW (ref 12.0–15.0)
Immature Granulocytes: 1 %
Lymphocytes Relative: 4 %
Lymphs Abs: 0.7 10*3/uL (ref 0.7–4.0)
MCH: 29 pg (ref 26.0–34.0)
MCHC: 30.3 g/dL (ref 30.0–36.0)
MCV: 95.8 fL (ref 80.0–100.0)
Monocytes Absolute: 0.8 10*3/uL (ref 0.1–1.0)
Monocytes Relative: 4 %
Neutro Abs: 15 10*3/uL — ABNORMAL HIGH (ref 1.7–7.7)
Neutrophils Relative %: 89 %
Platelets: 166 10*3/uL (ref 150–400)
RBC: 2.86 MIL/uL — ABNORMAL LOW (ref 3.87–5.11)
RDW: 15.2 % (ref 11.5–15.5)
WBC: 16.9 10*3/uL — ABNORMAL HIGH (ref 4.0–10.5)
nRBC: 0.4 % — ABNORMAL HIGH (ref 0.0–0.2)

## 2019-06-10 LAB — POTASSIUM
Potassium: 6.2 mmol/L — ABNORMAL HIGH (ref 3.5–5.1)
Potassium: 5.5 mmol/L — ABNORMAL HIGH (ref 3.5–5.1)
Potassium: 5.4 mmol/L — ABNORMAL HIGH (ref 3.5–5.1)
Potassium: 5.2 mmol/L — ABNORMAL HIGH (ref 3.5–5.1)
Potassium: 4.8 mmol/L (ref 3.5–5.1)

## 2019-06-10 LAB — MAGNESIUM: Magnesium: 2.4 mg/dL (ref 1.7–2.4)

## 2019-06-10 LAB — POCT I-STAT 7, (LYTES, BLD GAS, ICA,H+H)
Acid-base deficit: 2 mmol/L (ref 0.0–2.0)
Bicarbonate: 23.3 mmol/L (ref 20.0–28.0)
Calcium, Ion: 1.32 mmol/L (ref 1.15–1.40)
HCT: 25 % — ABNORMAL LOW (ref 36.0–46.0)
Hemoglobin: 8.5 g/dL — ABNORMAL LOW (ref 12.0–15.0)
O2 Saturation: 94 %
Patient temperature: 37.6
Potassium: 5.9 mmol/L — ABNORMAL HIGH (ref 3.5–5.1)
Sodium: 152 mmol/L — ABNORMAL HIGH (ref 135–145)
TCO2: 25 mmol/L (ref 22–32)
pCO2 arterial: 40.8 mmHg (ref 32.0–48.0)
pH, Arterial: 7.368 (ref 7.350–7.450)
pO2, Arterial: 74 mmHg — ABNORMAL LOW (ref 83.0–108.0)

## 2019-06-10 LAB — CULTURE, RESPIRATORY W GRAM STAIN
Culture: NORMAL
Special Requests: NORMAL

## 2019-06-10 LAB — PHOSPHORUS: Phosphorus: 5.2 mg/dL — ABNORMAL HIGH (ref 2.5–4.6)

## 2019-06-10 LAB — NA AND K (SODIUM & POTASSIUM), RAND UR
Potassium Urine: 36 mmol/L
Sodium, Ur: 40 mmol/L

## 2019-06-10 MED ORDER — LINEZOLID 600 MG/300ML IV SOLN
600.0000 mg | Freq: Two times a day (BID) | INTRAVENOUS | Status: DC
  Administered 2019-06-10 – 2019-06-13 (×8): 600 mg via INTRAVENOUS
  Filled 2019-06-10 (×9): qty 300

## 2019-06-10 MED ORDER — SODIUM POLYSTYRENE SULFONATE 15 GM/60ML PO SUSP
15.0000 g | Freq: Once | ORAL | Status: AC
  Administered 2019-06-10: 15 g via ORAL
  Filled 2019-06-10: qty 60

## 2019-06-10 MED ORDER — SODIUM ZIRCONIUM CYCLOSILICATE 10 G PO PACK
10.0000 g | PACK | Freq: Once | ORAL | Status: AC
  Administered 2019-06-10: 10 g via ORAL
  Filled 2019-06-10: qty 1

## 2019-06-10 MED ORDER — BISACODYL 10 MG RE SUPP
10.0000 mg | Freq: Once | RECTAL | Status: AC
  Administered 2019-06-10: 10 mg via RECTAL
  Filled 2019-06-10: qty 1

## 2019-06-10 MED ORDER — VANCOMYCIN HCL IN DEXTROSE 1-5 GM/200ML-% IV SOLN
1000.0000 mg | INTRAVENOUS | Status: DC
  Filled 2019-06-10: qty 200

## 2019-06-10 MED ORDER — DEXTROSE 5 % IV SOLN: INTRAVENOUS | Status: DC

## 2019-06-10 NOTE — Progress Notes (Signed)
eLink RN notified of potassium 6.4.

## 2019-06-10 NOTE — Progress Notes (Addendum)
NAME:  Anne [name], MRN:  [id number], DOB:  11/08/1955, LOS: 46 ADMISSION DATE:  05/26/2019 REFERRING MD:  Dr. Patsey Berthold, [facility], CHIEF COMPLAINT:  Short of breath   Brief History   64 yo female with hypoxia from COVID 19 pneumonia with ARDS.  Transferred from [facility] 3/03.  Past Medical History  Hep C, COPD, Diastolic CHF, HTN  Significant Hospital Events   2/27- Admission to [place] unit 2/27- Transfer to Stepdown due to hypotension, increasing FiO2 requirements, metabolic acidosis requiring Bicarb gtt 2/28- intubated, initiated mechanical ventilation. Central venous access right femoral placed 3/01- continued issues with oxygenation despite mechanical ventilation, placed in prone position 3/02- oxygenation remains tenuous, resumed supine position, recruitment maneuvers initiated, patient tolerating, initiating potential transfer to [facility] unit 28M, spoke with Dr. Kara Mead 3/03-[place] 28M unit as bed available for patient will transfer.  Oxygen requirements are decreasing. 3/03: Admitted to [place] 3/4: No overnight events 3/7 persistent hypoxemia despite diuresis.  3/12 - Setback overnight. On higher FiO2 now (60%) after spells of agitation and coughing caused desaturations.  3/14 vent dysynchrony, desats 3/15 Proned for progressive hypoxia, HAP  Consults:    Procedures:  ETT 2/28 >>  Lt PICC 3/05 >>   Significant Diagnostic Tests:  2/27- CT Renal Stone Study>> 1. Left nephrolithiasis, without obstructive uropathy. 2. Bibasilar peripheral ground-glass opacities, consistent with COVID-19 pneumonia. More focal nodular opacity in the left lower lobe could represent an area of consolidation or a true pulmonary nodule. Recommend follow-up chest CT at 3 months, after the patient is recovered from the acute illness. 3. Suspicion of cirrhosis, especially given the history of hepatitis B and C. 4. Aortic Atherosclerosis 2D Echocardiogram 3/1: LVEF 70 to 75%.   Hyperdynamic left ventricular function, left concentric ventricular hypertrophy, diastology normal normal right ventricular function.  Normal valve function  Micro Data:  SARS CoV2 2/27 >> positive Bllood 2/27 - neg c dff 2/27 -neg MRSA pcr 2/27 - positive  Bcx 3/15- Aerococcus Sp Cx 3/15- GPC, GNR BAL 3/15- Pending  Antimicrobials:  Doxy 3/6 - 3/8 Cefepime 3/06 > 3/13 Remdesivir 3/4 - 3/4 Decadron  Vanco 3/16 >> Cefepime 3/16 >>  Interim history/subjective:   Remains un prone, K still high  Objective   Blood pressure 101/68, pulse 85, temperature 98.6 F (37 C), resp. rate 17, height 5' 2.99" (1.6 m), weight 97 kg, SpO2 95 %.    Vent Mode: PRVC FiO2 (%):  [90 %-100 %] 90 % Set Rate:  [12 bmp] 12 bmp Vt Set:  [310 mL] 310 mL PEEP:  [16 cmH20] 16 cmH20   Intake/Output Summary (Last 24 hours) at 06/10/2019 1030 Last data filed at 06/10/2019 1000 Gross per 24 hour  Intake 1141.91 ml  Output 2250 ml  Net -1108.09 ml   Filed Weights   06/08/19 0451 06/09/19 0424 06/10/19 0400  Weight: 97.3 kg 89 kg 97 kg    Examination:  Gen:      No acute distress HEENT:  EOMI, sclera anicteric Neck:     No masses; no thyromegaly, ETT Lungs:    Clear to auscultation bilaterally; normal respiratory effort CV:         Regular rate and rhythm; no murmurs Abd:      + bowel sounds; soft, non-tender; no palpable masses, no distension Ext:    No edema; adequate peripheral perfusion Skin:      Warm and dry; no rash Neuro: [facility] Problem list   Elevated  troponin from demand ischemia, Bradycardia, Septic shock (HCAP/COVID),   Assessment & Plan:   Acute hypoxic respiratory failure with ARDS from COVID 19 pneumonia complicated by HCAP. Hx of COPD. Continue vent support Goal plateau pressure less than 30, driving pressure less than 15 Concern for new HAP given worsening oxygenation, leukocytosis and secretions Change Vanco to linezolid given GPC's in BAL and  Enterococcus in urine For trach once PEEP and FiO2 are down  Sepsis, not present on admission Source -  HAP pneumonia Wean levo as tolerated Follow final cultures. Continue cefepime, change Vanco to linezolid  AKI from ATN 2nd to hypoxia and sepsis. Hyperkalemia. Repeat kayexelate Hypernatremia Start D5 Follow labs  Cool lower extremities.  Monitor  Hyperglycemia.  SSI  Acute metabolic encephalopathy from hypoxia, sepsis. Sedated for vent dyssynchrony Intermittent paralytics Fentanyl, Precedex  Best practice:  Diet: Tube feeds DVT prophylaxis: Heparin 5000 tid GI prophylaxis: Protonix Mobility: bed rest Code Status: full code Family: Updated 3/17 Disposition: ICU  The patient is critically ill with multiple organ system failure and requires high complexity decision making for assessment and support, frequent evaluation and titration of therapies, advanced monitoring, review of radiographic studies and interpretation of complex data.   Critical Care Time devoted to patient care services, exclusive of separately billable procedures, described in this note is 35 minutes.   Marshell Garfinkel MD [place] [facility] Please see [email or web address] for pager details.  06/10/2019, 10:30 AM

## 2019-06-10 NOTE — Progress Notes (Signed)
eLink Physician-Brief Progress Note Patient Name: Anne [name] DOB: [birth year] MRN: [id number]   Date of Service  06/10/2019  HPI/Events of Note  Hyperkalemia with K+ 6.4  eICU Interventions  Kayexalate 15 gm po x 1        Anne [name] 06/10/2019, 3:47 AM

## 2019-06-10 NOTE — Progress Notes (Signed)
[facility] Progress Note Patient Name: Anne [name] DOB: Aug 29, 1955 MRN: [id number]   Date of Service  06/10/2019  HPI/Events of Note  K+ 6.2  eICU Interventions  Mild hyperkalemia protocol orders entered.        [name] 06/10/2019, 12:33 AM

## 2019-06-11 LAB — POCT I-STAT 7, (LYTES, BLD GAS, ICA,H+H)
Acid-base deficit: 2 mmol/L (ref 0.0–2.0)
Bicarbonate: 23.7 mmol/L (ref 20.0–28.0)
Calcium, Ion: 1.32 mmol/L (ref 1.15–1.40)
HCT: 24 % — ABNORMAL LOW (ref 36.0–46.0)
Hemoglobin: 8.2 g/dL — ABNORMAL LOW (ref 12.0–15.0)
O2 Saturation: 96 %
Patient temperature: 97.8
Potassium: 4.7 mmol/L (ref 3.5–5.1)
Sodium: 153 mmol/L — ABNORMAL HIGH (ref 135–145)
TCO2: 25 mmol/L (ref 22–32)
pCO2 arterial: 43.6 mmHg (ref 32.0–48.0)
pH, Arterial: 7.341 — ABNORMAL LOW (ref 7.350–7.450)
pO2, Arterial: 87 mmHg (ref 83.0–108.0)

## 2019-06-11 LAB — BASIC METABOLIC PANEL
Anion gap: 11 (ref 5–15)
BUN: 56 mg/dL — ABNORMAL HIGH (ref 8–23)
CO2: 22 mmol/L (ref 22–32)
Calcium: 8.4 mg/dL — ABNORMAL LOW (ref 8.9–10.3)
Chloride: 117 mmol/L — ABNORMAL HIGH (ref 98–111)
Creatinine, Ser: 1.77 mg/dL — ABNORMAL HIGH (ref 0.44–1.00)
GFR calc Af Amer: 35 mL/min — ABNORMAL LOW (ref >60)
GFR calc non Af Amer: 30 mL/min — ABNORMAL LOW (ref >60)
Glucose, Bld: 170 mg/dL — ABNORMAL HIGH (ref 70–99)
Potassium: 4.3 mmol/L (ref 3.5–5.1)
Sodium: 150 mmol/L — ABNORMAL HIGH (ref 135–145)

## 2019-06-11 LAB — CBC
HCT: 28.4 % — ABNORMAL LOW (ref 36.0–46.0)
Hemoglobin: 8.4 g/dL — ABNORMAL LOW (ref 12.0–15.0)
MCH: 29.3 pg (ref 26.0–34.0)
MCHC: 29.6 g/dL — ABNORMAL LOW (ref 30.0–36.0)
MCV: 99 fL (ref 80.0–100.0)
Platelets: 157 10*3/uL (ref 150–400)
RBC: 2.87 MIL/uL — ABNORMAL LOW (ref 3.87–5.11)
RDW: 15.3 % (ref 11.5–15.5)
WBC: 10.9 10*3/uL — ABNORMAL HIGH (ref 4.0–10.5)
nRBC: 0.3 % — ABNORMAL HIGH (ref 0.0–0.2)

## 2019-06-11 LAB — GLUCOSE, CAPILLARY
Glucose-Capillary: 107 mg/dL — ABNORMAL HIGH (ref 70–99)
Glucose-Capillary: 121 mg/dL — ABNORMAL HIGH (ref 70–99)
Glucose-Capillary: 122 mg/dL — ABNORMAL HIGH (ref 70–99)
Glucose-Capillary: 90 mg/dL (ref 70–99)
Glucose-Capillary: 93 mg/dL (ref 70–99)
Glucose-Capillary: 96 mg/dL (ref 70–99)

## 2019-06-11 LAB — POTASSIUM
Potassium: 4.6 mmol/L (ref 3.5–5.1)
Potassium: 4.6 mmol/L (ref 3.5–5.1)

## 2019-06-11 LAB — MAGNESIUM: Magnesium: 2.5 mg/dL — ABNORMAL HIGH (ref 1.7–2.4)

## 2019-06-11 LAB — CULTURE, RESPIRATORY W GRAM STAIN

## 2019-06-11 LAB — PHOSPHORUS: Phosphorus: 3.9 mg/dL (ref 2.5–4.6)

## 2019-06-11 MED ORDER — VITAL 1.5 CAL PO LIQD
1000.0000 mL | ORAL | Status: DC
  Administered 2019-06-11: 1000 mL
  Filled 2019-06-11: qty 1000

## 2019-06-11 MED ORDER — POLYETHYLENE GLYCOL 3350 17 G PO PACK
17.0000 g | PACK | Freq: Two times a day (BID) | ORAL | Status: DC
  Administered 2019-06-11 – 2019-06-13 (×5): 17 g
  Filled 2019-06-11 (×6): qty 1

## 2019-06-11 MED ORDER — SORBITOL 70 % SOLN
960.0000 mL | TOPICAL_OIL | Freq: Once | ORAL | Status: AC
  Administered 2019-06-11: 960 mL via RECTAL
  Filled 2019-06-11: qty 473

## 2019-06-11 NOTE — Progress Notes (Addendum)
NAME:  Anne [name], MRN:  [id number], DOB:  15-Oct-1955, LOS: 57 ADMISSION DATE:  06/11/2019 REFERRING MD:  Dr. Patsey Berthold, [facility], CHIEF COMPLAINT:  Short of breath   Brief History   64 yo female with hypoxia from COVID 19 pneumonia with ARDS.  Transferred from [facility] 3/03.  Past Medical History  Hep C, COPD, Diastolic CHF, HTN  Significant Hospital Events   2/27- Admission to [place] unit 2/27- Transfer to Stepdown due to hypotension, increasing FiO2 requirements, metabolic acidosis requiring Bicarb gtt 2/28- intubated, initiated mechanical ventilation. Central venous access right femoral placed 3/01- continued issues with oxygenation despite mechanical ventilation, placed in prone position 3/02- oxygenation remains tenuous, resumed supine position, recruitment maneuvers initiated, patient tolerating, initiating potential transfer to [facility] unit 19M, spoke with Dr. Kara Mead 3/03-[place] 19M unit as bed available for patient will transfer.  Oxygen requirements are decreasing. 3/03: Admitted to [place] 3/4: No overnight events 3/7 persistent hypoxemia despite diuresis.  3/12 - Setback overnight. On higher FiO2 now (60%) after spells of agitation and coughing caused desaturations.  3/14 vent dysynchrony, desats 3/15 Proned for progressive hypoxia, HAP  Consults:    Procedures:  ETT 2/28 >>  Lt PICC 3/05 >>   Significant Diagnostic Tests:  2/27- CT Renal Stone Study>> 1. Left nephrolithiasis, without obstructive uropathy. 2. Bibasilar peripheral ground-glass opacities, consistent with COVID-19 pneumonia. More focal nodular opacity in the left lower lobe could represent an area of consolidation or a true pulmonary nodule. Recommend follow-up chest CT at 3 months, after the patient is recovered from the acute illness. 3. Suspicion of cirrhosis, especially given the history of hepatitis B and C. 4. Aortic Atherosclerosis 2D Echocardiogram 3/1: LVEF 70 to 75%.   Hyperdynamic left ventricular function, left concentric ventricular hypertrophy, diastology normal normal right ventricular function.  Normal valve function  Micro Data:  SARS CoV2 2/27 >> positive Bllood 2/27 - neg c dff 2/27 -neg MRSA pcr 2/27 - positive  Bcx 3/15- NGTD VRE 3/15- VRE BAL 3/15- VRE  Antimicrobials:  Doxy 3/6 - 3/8 Cefepime 3/06 > 3/13 Remdesivir 3/4 - 3/4 Decadron Vanco 3/16 >> 3/17 Cefepime 3/16 >> 3/18  Linezolid 3/17 >>   Interim history/subjective:   Remains on vent, weaning off levo  Objective   Blood pressure (!) 85/66, pulse 62, temperature 97.6 F (36.4 C), temperature source Oral, resp. rate (!) 26, height 5' 2.99" (1.6 m), weight 96.5 kg, SpO2 94 %.    Vent Mode: PRVC FiO2 (%):  [70 %-80 %] 70 % Set Rate:  [12 bmp] 12 bmp Vt Set:  [310 mL] 310 mL PEEP:  [14 cmH20-16 cmH20] 14 cmH20   Intake/Output Summary (Last 24 hours) at 06/11/2019 0902 Last data filed at 06/11/2019 0800 Gross per 24 hour  Intake 2884.99 ml  Output 1850 ml  Net 1034.99 ml   Filed Weights   06/09/19 0424 06/10/19 0400 06/11/19 0352  Weight: 89 kg 97 kg 96.5 kg    Examination:  Gen:      No acute distress HEENT:  EOMI, sclera anicteric, ETT Neck:     No masses; no thyromegaly Lungs:    Clear to auscultation bilaterally; normal respiratory effort CV:         Regular rate and rhythm; no murmurs Abd:      + bowel sounds; soft, non-tender; no palpable masses, no distension Ext:    No edema; adequate peripheral perfusion Skin:      Warm and dry; no rash  Neuro: Sedated  Labs significant for potassium 4.7, WBC 9, hemoglobin 8.4 No new imaging  Resolved Hospital Problem list   Elevated troponin from demand ischemia, Bradycardia, Septic shock (HCAP/COVID),   Assessment & Plan:   Acute hypoxic respiratory failure with ARDS from COVID 19 pneumonia complicated by HAP. VRE in BAL and urine Hx of COPD. Continue vent support Goal plateau pressure less than 30,  driving pressure less than 15 For trach once PEEP and FiO2 are down  Sepsis, not present on admission Source -  HAP pneumonia, UTI Wean levo as tolerated Vanco changed to linezolid for VRE Can stop cefepime  AKI from ATN 2nd to hypoxia and sepsis. Hypernatremia Continue D5 Follow labs  Cool lower extremities.  Monitor  Hyperglycemia.  SSI  Acute metabolic encephalopathy from hypoxia, sepsis. Sedated for vent dyssynchrony Intermittent paralytics Fentanyl, Precedex  Best practice:  Diet: Tube feeds DVT prophylaxis: Heparin 5000 tid GI prophylaxis: Protonix Mobility: bed rest Code Status: full code Family: Daughter Areale updated 3/18 Disposition: ICU  The patient is critically ill with multiple organ system failure and requires high complexity decision making for assessment and support, frequent evaluation and titration of therapies, advanced monitoring, review of radiographic studies and interpretation of complex data.   Critical Care Time devoted to patient care services, exclusive of separately billable procedures, described in this note is 35 minutes.   Marshell Garfinkel MD [place] [facility] Please see [email or web address] for pager details.  06/11/2019, 9:02 AM

## 2019-06-11 NOTE — Progress Notes (Addendum)
Nutrition Follow-up  DOCUMENTATION CODES:   Obesity unspecified  INTERVENTION:   Tube feeding:  -Vital 1.5 @ 20 ml/hr -Increase per toleration to goal rate of 40 ml/hr (960 ml) -60 ml Prostat BID  At goal rate TF provides: 1840 kcals, 125 grams protein, 733 ml free water.   NUTRITION DIAGNOSIS:   Inadequate oral intake related to acute illness as evidenced by NPO status.  Ongoing  GOAL:   Provide needs based on ASPEN/SCCM guidelines  TF held   MONITOR:   Vent status, Weight trends, Labs, TF tolerance, Skin  REASON FOR ASSESSMENT:   Consult, Ventilator Enteral/tube feeding initiation and management  ASSESSMENT:   64 yo female admitted to [facility] with admission for acute respiratory failure secondary to COVID-19 pneumonia requiring intubation 123456, metabolic acidosis, AKI. PMH includes CHF, COPD, hepatitis C   3/15- vomiting episode, TF held  RD working remotely.  Weaning off levophed. Potassium improved. Now hypernatremic- consider addition of free water flushes. OGT output significantly decreased today. KUB on 3/16 negative for obstruction. Per RN, pt had enema this am with little result (now day 5). Okay to start trickles per CCM. Consider post-pyloric Cortrak if vomiting continues.   Admission weight: 105.7 kg  Current weight: 96.5 kg   Patient remains intubated on ventilator support MV: 11.1 L/min Temp (24hrs), Avg:97.8 F (36.6 C), Min:96.4 F (35.8 C), Max:98.5 F (36.9 C)   I/O: -2,265 ml since 3/4 UOP: 1,820 ml x 24 hrs  OGT: 250 ml x 24 hrs  Drips: precedex, D5 @ 50 ml/hr, levophed Medications: SS novolog, lantus, senokot, miralax Labs: Na 150 (H) Mg 2.5 (H) Cr 1.77-trending down  Diet Order:   Diet Order    None      EDUCATION NEEDS:   Not appropriate for education at this time  Skin:  Skin Assessment: Reviewed RN Assessment  Last BM:  3/13  Height:   Ht Readings from Last 1 Encounters:  06/09/2019 5' 2.99" (1.6 m)     Weight:   Wt Readings from Last 1 Encounters:  06/11/19 96.5 kg    Ideal Body Weight:  52.3 kg(Adjusted BW 72 kg)  BMI:  Body mass index is 37.7 kg/m.  Estimated Nutritional Needs:   Kcal:  [id number] kcal  Protein:  100-130 grams  Fluid:  >/= 1.7 L/day  Mariana Single RD, LDN Clinical Nutrition Pager listed in [place]

## 2019-06-12 ENCOUNTER — Inpatient Hospital Stay (HOSPITAL_COMMUNITY): Payer: Medicaid Other

## 2019-06-12 LAB — CBC
HCT: 25.6 % — ABNORMAL LOW (ref 36.0–46.0)
Hemoglobin: 7.5 g/dL — ABNORMAL LOW (ref 12.0–15.0)
MCH: 28.8 pg (ref 26.0–34.0)
MCHC: 29.3 g/dL — ABNORMAL LOW (ref 30.0–36.0)
MCV: 98.5 fL (ref 80.0–100.0)
Platelets: 143 10*3/uL — ABNORMAL LOW (ref 150–400)
RBC: 2.6 MIL/uL — ABNORMAL LOW (ref 3.87–5.11)
RDW: 15.3 % (ref 11.5–15.5)
WBC: 8.2 10*3/uL (ref 4.0–10.5)
nRBC: 0.6 % — ABNORMAL HIGH (ref 0.0–0.2)

## 2019-06-12 LAB — POCT I-STAT 7, (LYTES, BLD GAS, ICA,H+H)
Acid-base deficit: 1 mmol/L (ref 0.0–2.0)
Bicarbonate: 22.7 mmol/L (ref 20.0–28.0)
Calcium, Ion: 1.34 mmol/L (ref 1.15–1.40)
HCT: 32 % — ABNORMAL LOW (ref 36.0–46.0)
Hemoglobin: 10.9 g/dL — ABNORMAL LOW (ref 12.0–15.0)
O2 Saturation: 95 %
Patient temperature: 37.7
Potassium: 4.6 mmol/L (ref 3.5–5.1)
Sodium: 150 mmol/L — ABNORMAL HIGH (ref 135–145)
TCO2: 24 mmol/L (ref 22–32)
pCO2 arterial: 34.6 mmHg (ref 32.0–48.0)
pH, Arterial: 7.428 (ref 7.350–7.450)
pO2, Arterial: 78 mmHg — ABNORMAL LOW (ref 83.0–108.0)

## 2019-06-12 LAB — PHOSPHORUS: Phosphorus: 3.1 mg/dL (ref 2.5–4.6)

## 2019-06-12 LAB — BASIC METABOLIC PANEL
Anion gap: 12 (ref 5–15)
BUN: 49 mg/dL — ABNORMAL HIGH (ref 8–23)
CO2: 21 mmol/L — ABNORMAL LOW (ref 22–32)
Calcium: 8.8 mg/dL — ABNORMAL LOW (ref 8.9–10.3)
Chloride: 117 mmol/L — ABNORMAL HIGH (ref 98–111)
Creatinine, Ser: 1.65 mg/dL — ABNORMAL HIGH (ref 0.44–1.00)
GFR calc Af Amer: 38 mL/min — ABNORMAL LOW (ref >60)
GFR calc non Af Amer: 33 mL/min — ABNORMAL LOW (ref >60)
Glucose, Bld: 142 mg/dL — ABNORMAL HIGH (ref 70–99)
Potassium: 4.5 mmol/L (ref 3.5–5.1)
Sodium: 150 mmol/L — ABNORMAL HIGH (ref 135–145)

## 2019-06-12 LAB — GLUCOSE, CAPILLARY
Glucose-Capillary: 121 mg/dL — ABNORMAL HIGH (ref 70–99)
Glucose-Capillary: 122 mg/dL — ABNORMAL HIGH (ref 70–99)
Glucose-Capillary: 123 mg/dL — ABNORMAL HIGH (ref 70–99)
Glucose-Capillary: 136 mg/dL — ABNORMAL HIGH (ref 70–99)
Glucose-Capillary: 157 mg/dL — ABNORMAL HIGH (ref 70–99)
Glucose-Capillary: 170 mg/dL — ABNORMAL HIGH (ref 70–99)

## 2019-06-12 LAB — MAGNESIUM: Magnesium: 2.2 mg/dL (ref 1.7–2.4)

## 2019-06-12 MED ORDER — FAMOTIDINE 40 MG/5ML PO SUSR
20.0000 mg | Freq: Every day | ORAL | Status: DC
  Administered 2019-06-12 – 2019-06-13 (×2): 20 mg
  Filled 2019-06-12 (×2): qty 2.5

## 2019-06-12 MED ORDER — FREE WATER
200.0000 mL | Status: DC
  Administered 2019-06-12 – 2019-06-13 (×10): 200 mL

## 2019-06-12 MED ORDER — VITAL 1.5 CAL PO LIQD
1000.0000 mL | ORAL | Status: DC
  Administered 2019-06-12: 1000 mL
  Filled 2019-06-12 (×2): qty 1000

## 2019-06-12 NOTE — Progress Notes (Signed)
Patient son called and updated on patient status and plan of care. All questions answered at this time.

## 2019-06-12 NOTE — Progress Notes (Signed)
Nutrition Follow-up  DOCUMENTATION CODES:   Obesity unspecified  INTERVENTION:   Tube feeding:  -Vital 1.5 @ 20 ml/hr -Increase by 10 ml Q4 hours to goal rate of 40 ml/hr (960 ml) -60 ml Prostat BID -Free water 200 ml Q4 hours- per CCM  At goal rate TF provides: 1840 kcals, 125 grams protein, 733 ml free water (1933 ml with flushes).   NUTRITION DIAGNOSIS:   Inadequate oral intake related to acute illness as evidenced by NPO status.  Ongoing  GOAL:   Provide needs based on ASPEN/SCCM guidelines  Addressed via TF  MONITOR:   Vent status, Weight trends, Labs, TF tolerance, Skin  REASON FOR ASSESSMENT:   Consult, Ventilator Enteral/tube feeding initiation and management  ASSESSMENT:   64 yo female admitted [facility] with admission for acute respiratory failure secondary to COVID-19 pneumonia requiring intubation [id number], metabolic acidosis, AKI. PMH includes CHF, COPD, hepatitis C   3/15- vomiting episode, TF held  RD working remotely.  On low dose levophed. Not proning today. Remains hypernatremic- free water added per CCM. Had enema yesterday with results. Abdomen soft with bowel sounds per RN. Continues with schedule bowel regimen. Plan titration of tube feeding to goal.   Admission weight: 105.7 kg  Current weight: 96.5 kg   Patient remains intubated on ventilator support MV: 10.9 L/min Temp (24hrs), Avg:100.2 F (37.9 C), Min:99.1 F (37.3 C), Max:101.1 F (38.4 C)   I/O: -6,369 ml since 3/5 UOP: 1,650 ml x 24 hrs  Drips: precedex, D5 @ 50 ml/hr, levophed Medications: SS novolog, lantus, senokot, miralax Labs: Na 150 (H)  Cr 1.65-trending down CBG 93-170  Diet Order:   Diet Order    None      EDUCATION NEEDS:   Not appropriate for education at this time  Skin:  Skin Assessment: Reviewed RN Assessment  Last BM:  3/18  Height:   Ht Readings from Last 1 Encounters:  06/13/2019 5' 2.99" (1.6 m)    Weight:   Wt Readings from  Last 1 Encounters:  06/11/19 96.5 kg    Ideal Body Weight:  52.3 kg(Adjusted BW 72 kg)  BMI:  Body mass index is 37.7 kg/m.  Estimated Nutritional Needs:   Kcal:  [id number] kcal  Protein:  100-130 grams  Fluid:  >/= 1.7 L/day  Mariana Single RD, LDN Clinical Nutrition Pager listed in [place]

## 2019-06-12 NOTE — Progress Notes (Signed)
NAME:  Anne [name], MRN:  [id number], DOB:  November 12, 1955, LOS: 13 ADMISSION DATE:  06/22/2019 REFERRING MD:  Dr. Patsey Berthold, [facility], CHIEF COMPLAINT:  Short of breath   Brief History   64 yo female with  COVID 19 pneumonia with ARDS.  Transferred from [facility] 3/03.  Past Medical History  Hep C, COPD, Diastolic CHF, HTN  Significant Hospital Events   2/27- Admission to [place] unit 2/27- Transfer to Stepdown due to hypotension, increasing FiO2 requirements, metabolic acidosis requiring Bicarb gtt 2/28- intubated, initiated mechanical ventilation. Central venous access right femoral placed 3/01- continued issues with oxygenation despite mechanical ventilation, placed in prone position 3/02- oxygenation remains tenuous, resumed supine position, recruitment maneuvers initiated, patient tolerating, initiating potential transfer to [facility] unit 2M 3/03-[place] 2M unit as bed available for patient will transfer.  Oxygen requirements are decreasing. 3/7 persistent hypoxemia despite diuresis.  3/12 - Setback overnight. On higher FiO2 now (60%) after spells of agitation and coughing caused desaturations.  3/14 vent dysynchrony, desats 3/15 Proned for progressive hypoxia, HAP  Consults:    Procedures:  ETT 2/28 >>  Lt PICC 3/05 >>   Significant Diagnostic Tests:  2/27- CT Renal Stone Study>> 1. Left nephrolithiasis, without obstructive uropathy. 2. Bibasilar peripheral ground-glass opacities, consistent with COVID-19 pneumonia. More focal nodular opacity in the left lower lobe could represent an area of consolidation or a true pulmonary nodule. Recommend follow-up chest CT at 3 months, after the patient is recovered from the acute illness. 3. Suspicion of cirrhosis, especially given the history of hepatitis B and C. 4. Aortic Atherosclerosis 2D Echocardiogram 3/1: LVEF 70 to 75%.  Hyperdynamic left ventricular function, left concentric ventricular hypertrophy, diastology normal  normal right ventricular function.  Normal valve function  Micro Data:  SARS CoV2 2/27 >> positive Bllood 2/27 - neg c dff 2/27 -neg MRSA pcr 2/27 - positive  Bcx 3/15- NGTD URINE 3/15- VRE BAL 3/15- VRE  Antimicrobials:  Doxy 3/6 - 3/8 Cefepime 3/06 > 3/13 Remdesivir 3/4 - 3/4 Decadron Vanco 3/16 >> 3/17 Cefepime 3/16 >> 3/18  Linezolid 3/17 >>   Interim history/subjective:   Remains critically ill, on vent On low-dose Levophed Sedated on Precedex Good urine output Low-grade febrile  Objective   Blood pressure 105/66, pulse 77, temperature 99.9 F (37.7 C), temperature source Bladder, resp. rate 18, height 5' 2.99" (1.6 m), weight 96.5 kg, SpO2 91 %.    Vent Mode: PCV FiO2 (%):  [70 %-80 %] 80 % Set Rate:  [12 bmp-23 bmp] 20 bmp Vt Set:  [310 mL] 310 mL PEEP:  [12 cmH20-14 cmH20] 14 cmH20   Intake/Output Summary (Last 24 hours) at 06/12/2019 1217 Last data filed at 06/12/2019 1200 Gross per 24 hour  Intake 2151.84 ml  Output 2725 ml  Net -573.16 ml   Filed Weights   06/09/19 0424 06/10/19 0400 06/11/19 0352  Weight: 89 kg 97 kg 96.5 kg    Examination:  Gen:   Elderly woman, chronically ill-appearing, HEENT:  EOMI, mild pallor, sclera anicteric,oral  ETT Neck:     No JVD, no thyromegaly Lungs:    No accessory muscle use, bilateral ventilated breath sounds, asynchronous CV:         Regular rate and rhythm; no murmurs Abd:      + bowel sounds; soft, non-tender; no palpable masses, no distension Ext:    No edema; adequate peripheral perfusion Skin:      Warm and dry; no rash Neuro: Does not  follow commands, RASS -3  Chest x-ray 3/19 shows stable bilateral interstitial and alveolar airspace disease.  Labs show stable hypernatremia, stable BUN and creatinine, mild hyperchloremia, no leukocytosis, stable anemia  Resolved Hospital Problem list   Elevated troponin from demand ischemia, Bradycardia, Septic shock (HCAP/COVID),   Assessment & Plan:   Acute  hypoxic respiratory failure with ARDS from COVID 19 pneumonia   HAP -VRE in BAL and urine Hx of COPD. Change to pressure control ventilation for asynchrony , driving pressure 8 cm , lung compliance is much improved Will need trach once PEEP and FiO2 are down  Septic shock Source -  HAP pneumonia, UTI Wean levo as tolerated Ct linezolid for VRE   AKI from ATN 2nd to hypoxia and sepsis. Hypernatremia Continue D5 @ 50 ADd free water 200 every 4   Cool lower extremities.  Monitor  Hyperglycemia.  SSI  Acute metabolic encephalopathy from hypoxia, sepsis. Sedated for vent dyssynchrony, goal RASS -2 Try to avoid paralytics here Fentanyl, Precedex Continue enteral clonazepam and Seroquel  Constipation -received enema 3/18 with bowel movement Continue bowel regimen with Dulcolax and MiraLAX while on fentanyl drip  Best practice:  Diet: Tube feeds DVT prophylaxis: Heparin sq GI prophylaxis: Protonix Mobility: bed rest Code Status: full code Family: Daughter Areale updated Disposition: ICU  The patient is critically ill with multiple organ systems failure and requires high complexity decision making for assessment and support, frequent evaluation and titration of therapies, application of advanced monitoring technologies and extensive interpretation of multiple databases. Critical Care Time devoted to patient care services described in this note independent of APP/resident  time is 33 minutes.    Kara Mead MD. [place]. [place] [facility]  If no response to pager , please call [phone]      06/12/2019, 12:17 PM

## 2019-06-13 LAB — CULTURE, BLOOD (ROUTINE X 2)
Culture: NO GROWTH
Culture: NO GROWTH

## 2019-06-13 LAB — GLUCOSE, CAPILLARY
Glucose-Capillary: 140 mg/dL — ABNORMAL HIGH (ref 70–99)
Glucose-Capillary: 114 mg/dL — ABNORMAL HIGH (ref 70–99)
Glucose-Capillary: 140 mg/dL — ABNORMAL HIGH (ref 70–99)
Glucose-Capillary: 233 mg/dL — ABNORMAL HIGH (ref 70–99)
Glucose-Capillary: 149 mg/dL — ABNORMAL HIGH (ref 70–99)
Glucose-Capillary: 38 mg/dL — CL (ref 70–99)
Glucose-Capillary: 125 mg/dL — ABNORMAL HIGH (ref 70–99)

## 2019-06-13 LAB — MAGNESIUM: Magnesium: 1.9 mg/dL (ref 1.7–2.4)

## 2019-06-13 LAB — BASIC METABOLIC PANEL
Anion gap: 10 (ref 5–15)
BUN: 43 mg/dL — ABNORMAL HIGH (ref 8–23)
CO2: 20 mmol/L — ABNORMAL LOW (ref 22–32)
Calcium: 9 mg/dL (ref 8.9–10.3)
Chloride: 110 mmol/L (ref 98–111)
Creatinine, Ser: 1.48 mg/dL — ABNORMAL HIGH (ref 0.44–1.00)
GFR calc Af Amer: 44 mL/min — ABNORMAL LOW (ref >60)
GFR calc non Af Amer: 38 mL/min — ABNORMAL LOW (ref >60)
Glucose, Bld: 262 mg/dL — ABNORMAL HIGH (ref 70–99)
Potassium: 4.7 mmol/L (ref 3.5–5.1)
Sodium: 140 mmol/L (ref 135–145)

## 2019-06-13 LAB — POCT I-STAT 7, (LYTES, BLD GAS, ICA,H+H)
Acid-base deficit: 9 mmol/L — ABNORMAL HIGH (ref 0.0–2.0)
Bicarbonate: 17 mmol/L — ABNORMAL LOW (ref 20.0–28.0)
Calcium, Ion: 1.34 mmol/L (ref 1.15–1.40)
HCT: 25 % — ABNORMAL LOW (ref 36.0–46.0)
Hemoglobin: 8.5 g/dL — ABNORMAL LOW (ref 12.0–15.0)
O2 Saturation: 80 %
Potassium: 6.1 mmol/L — ABNORMAL HIGH (ref 3.5–5.1)
Sodium: 141 mmol/L (ref 135–145)
TCO2: 18 mmol/L — ABNORMAL LOW (ref 22–32)
pCO2 arterial: 38.4 mmHg (ref 32.0–48.0)
pH, Arterial: 7.255 — ABNORMAL LOW (ref 7.350–7.450)
pO2, Arterial: 51 mmHg — ABNORMAL LOW (ref 83.0–108.0)

## 2019-06-13 LAB — PHOSPHORUS: Phosphorus: 4.6 mg/dL (ref 2.5–4.6)

## 2019-06-13 MED ORDER — FUROSEMIDE 10 MG/ML IJ SOLN
40.0000 mg | Freq: Once | INTRAMUSCULAR | Status: AC
  Administered 2019-06-13: 40 mg via INTRAVENOUS
  Filled 2019-06-13: qty 4

## 2019-06-13 MED ORDER — DEXTROSE IN LACTATED RINGERS 5 % IV SOLN: INTRAVENOUS | Status: DC

## 2019-06-13 MED ORDER — VASOPRESSIN 20 UNIT/ML IV SOLN
0.0300 [IU]/min | INTRAVENOUS | Status: DC
  Administered 2019-06-13: 0.03 [IU]/min via INTRAVENOUS
  Filled 2019-06-13: qty 2

## 2019-06-13 MED ORDER — DEXTROSE 50 % IV SOLN
INTRAVENOUS | Status: AC
  Administered 2019-06-13: 25 g via INTRAVENOUS
  Filled 2019-06-13: qty 50

## 2019-06-13 MED ORDER — DEXTROSE 50 % IV SOLN: 25.0000 g | INTRAVENOUS | Status: AC

## 2019-06-13 NOTE — Progress Notes (Signed)
ABG    Component Value Date/Time   PHART 7.255 (L) 06/13/2019 1532   PCO2ART 38.4 06/13/2019 1532   PO2ART 51.0 (L) 06/13/2019 1532   HCO3 17.0 (L) 06/13/2019 1532   TCO2 18 (L) 06/13/2019 1532   ACIDBASEDEF 9.0 (H) 06/13/2019 1532   O2SAT 80.0 06/13/2019 1532    Unable to maintain SpO2.  Already on 100% FiO2 on vent.  Spoke with pt's son and daughter-in-law.  Explained that oxygenation issues are related to COVID pneumonia complicated by bacterial HCAP.  She is on maximal therapy, and nothing else to offer at this time.  Explained that if she does not improve with current therapies, then I am not sure how Payes she will be able to sustain herself in spite of maximal therapies.  Time spent 14 minutes  Chesley Mires, MD [place] 06/13/2019, 3:52 PM

## 2019-06-13 NOTE — Progress Notes (Signed)
NAME:  Anne [name], MRN:  [id number], DOB:  09/11/1955, LOS: 36 ADMISSION DATE:  05/26/2019 REFERRING MD:  Dr. Patsey Berthold, [facility], CHIEF COMPLAINT:  Short of breath   Brief History   64 yo female with  COVID 19 pneumonia with ARDS.  Transferred from [facility] 3/03.  Past Medical History  Hep C, COPD, Diastolic CHF, HTN  Significant Hospital Events   2/27- Admission to [place] unit 2/27- Transfer to Stepdown due to hypotension, increasing FiO2 requirements, metabolic acidosis requiring Bicarb gtt 2/28- intubated, initiated mechanical ventilation. Central venous access right femoral placed 3/01- continued issues with oxygenation despite mechanical ventilation, placed in prone position 3/02- oxygenation remains tenuous, resumed supine position, recruitment maneuvers initiated, patient tolerating, initiating potential transfer to [facility] 109M 3/03-[facility] 109M unit as bed available for patient will transfer.  Oxygen requirements are decreasing. 3/7 persistent hypoxemia despite diuresis.  3/12 - Setback overnight. On higher FiO2 now (60%) after spells of agitation and coughing caused desaturations.  3/14 vent dysynchrony, desats 3/15 Proned for progressive hypoxia, HAP  Consults:    Procedures:  ETT 2/28 >>  Lt PICC 3/05 >>   Significant Diagnostic Tests:  2/27- CT Renal Stone Study>> 1. Left nephrolithiasis, without obstructive uropathy. 2. Bibasilar peripheral ground-glass opacities, consistent with COVID-19 pneumonia. More focal nodular opacity in the left lower lobe could represent an area of consolidation or a true pulmonary nodule. Recommend follow-up chest CT at 3 months, after the patient is recovered from the acute illness. 3. Suspicion of cirrhosis, especially given the history of hepatitis B and C. 4. Aortic Atherosclerosis 2D Echocardiogram 3/1: LVEF 70 to 75%.  Hyperdynamic left ventricular function, left concentric ventricular hypertrophy, diastology normal  normal right ventricular function.  Normal valve function  Micro Data:  SARS CoV2 2/27 >> positive Bllood 2/27 - neg c dff 2/27 -neg MRSA pcr 2/27 - positive  Bcx 3/15- negative URINE 3/15- VRE BAL 3/15- VRE  Antimicrobials:  Doxy 3/6 - 3/8 Cefepime 3/06 > 3/13 Remdesivir 3/4 - 3/4 Decadron Vanco 3/16 >> 3/17 Cefepime 3/16 >> 3/18  Linezolid 3/17 >>  Interim history/subjective:  Remains on increased PEEP/FiO2, pressors.  Objective   Blood pressure 113/65, pulse 83, temperature (!) 100.4 F (38 C), resp. rate (!) 38, height 5' 2.99" (1.6 m), weight 96.5 kg, SpO2 91 %.    Vent Mode: PCV FiO2 (%):  [80 %-100 %] 100 % Set Rate:  [20 bmp] 20 bmp PEEP:  [14 cmH20] 14 cmH20   Intake/Output Summary (Last 24 hours) at 06/13/2019 0818 Last data filed at 06/13/2019 0522 Gross per 24 hour  Intake 3195.36 ml  Output 1695 ml  Net 1500.36 ml   Filed Weights   06/09/19 0424 06/10/19 0400 06/11/19 0352  Weight: 89 kg 97 kg 96.5 kg    Examination:   General - sedated Eyes - pupils reactive ENT - ETT in place Cardiac - regular rate/rhythm, no murmur Chest - b/l crackles Abdomen - soft, non tender, + bowel sounds Extremities - 1+ edema Skin - no rashes Neuro - RASS -3   Resolved Hospital Problem list   Elevated troponin from demand ischemia, Bradycardia, AKI from ATN 2nd to sepsis, Hypernatremia  Assessment & Plan:   Acute hypoxic respiratory failure with ARDS from COVID 19 pneumonia complicated by VRE HCAP with hx of COPD. - pressure control 10 over PEEP 14, rate 20; goal SpO2 85 to 95% - continue pulmicort, duoneb - day 4 of linezolid - lasix 40 mg  IV x one on 3/20 - will likely need trach - past 20 day window from diagnosis of COVID > no longer needs airborne isolation - needs contact isolation for VRE  Septic shock from HCAP, UTI. - pressors to keep MAP > 65  Hyperglycemia.  - SSI with lantus  Acute metabolic encephalopathy 2nd to sepsis, hypoxia. - RASS  goal -2 to -3 - continue klonopin, seroquel  Anemia, thrombocytopenia in setting of sepsis with critical illness. - f/u CBC - monitor closely while on linezolid  Hx of HLD, HTN. - continue ASA, lipitor - hold outpt norvasc, coreg, cozaar, aldactone  Best practice:  Diet: Tube feeds DVT prophylaxis: Heparin sq GI prophylaxis: pepcid Mobility: bed rest Code Status: no CPR, no defibrillation Disposition: ICU  CC time 34 minutes  Chesley Mires, MD [place] 06/13/2019, 8:32 AM

## 2019-06-13 NOTE — Progress Notes (Signed)
Pt unable to maintain SpO2 despite optimal vent settings. Pt also increasingly hypotensive despite high pressor support.  MD aware. Family (daughter and son) made aware of acute changes and encouraged to come visit with Anne [name], as she is not predicted to survive much longer.

## 2019-06-13 NOTE — Progress Notes (Signed)
Pt's daughter Areale called and given update on [name]'s condition and plan of care. All questions answered.

## 2019-06-13 NOTE — Progress Notes (Signed)
Daughter and son at bedside for family visit. All questions answered. Emotional support provided.

## 2019-06-13 NOTE — Progress Notes (Signed)
eLink Physician-Brief Progress Note Patient Name: Alliene Ashworth DOB: [birth year] MRN: [id number]   Date of Service  06/13/2019  HPI/Events of Note  Hypoglycemia after tube feeds were stopped, hypotension on Levophed.  eICU Interventions  D 5 LR @ 75 ml/ hour, Vasopressin ordered.        Kerry Kass Falan Hensler 06/13/2019, 10:08 PM

## 2019-06-14 ENCOUNTER — Inpatient Hospital Stay (HOSPITAL_COMMUNITY): Payer: Medicaid Other

## 2019-06-14 LAB — BASIC METABOLIC PANEL
Anion gap: 13 (ref 5–15)
BUN: 52 mg/dL — ABNORMAL HIGH (ref 8–23)
CO2: 15 mmol/L — ABNORMAL LOW (ref 22–32)
Calcium: 7.5 mg/dL — ABNORMAL LOW (ref 8.9–10.3)
Chloride: 112 mmol/L — ABNORMAL HIGH (ref 98–111)
Creatinine, Ser: 2.54 mg/dL — ABNORMAL HIGH (ref 0.44–1.00)
GFR calc Af Amer: 23 mL/min — ABNORMAL LOW (ref >60)
GFR calc non Af Amer: 20 mL/min — ABNORMAL LOW (ref >60)
Glucose, Bld: 114 mg/dL — ABNORMAL HIGH (ref 70–99)
Potassium: 7.3 mmol/L (ref 3.5–5.1)
Sodium: 140 mmol/L (ref 135–145)

## 2019-06-14 LAB — CBC
HCT: 25.7 % — ABNORMAL LOW (ref 36.0–46.0)
HCT: 24.2 % — ABNORMAL LOW (ref 36.0–46.0)
Hemoglobin: 7.5 g/dL — ABNORMAL LOW (ref 12.0–15.0)
Hemoglobin: 6.9 g/dL — CL (ref 12.0–15.0)
MCH: 29.1 pg (ref 26.0–34.0)
MCH: 29.5 pg (ref 26.0–34.0)
MCHC: 29.2 g/dL — ABNORMAL LOW (ref 30.0–36.0)
MCHC: 28.5 g/dL — ABNORMAL LOW (ref 30.0–36.0)
MCV: 99.6 fL (ref 80.0–100.0)
MCV: 103.4 fL — ABNORMAL HIGH (ref 80.0–100.0)
Platelets: 121 10*3/uL — ABNORMAL LOW (ref 150–400)
Platelets: 137 10*3/uL — ABNORMAL LOW (ref 150–400)
RBC: 2.58 MIL/uL — ABNORMAL LOW (ref 3.87–5.11)
RBC: 2.34 MIL/uL — ABNORMAL LOW (ref 3.87–5.11)
RDW: 15.3 % (ref 11.5–15.5)
RDW: 15.1 % (ref 11.5–15.5)
WBC: 8.6 10*3/uL (ref 4.0–10.5)
WBC: 18.1 10*3/uL — ABNORMAL HIGH (ref 4.0–10.5)
nRBC: 0.5 % — ABNORMAL HIGH (ref 0.0–0.2)
nRBC: 4 % — ABNORMAL HIGH (ref 0.0–0.2)

## 2019-06-14 MED ORDER — ALBUTEROL SULFATE (2.5 MG/3ML) 0.083% IN NEBU: 10.0000 mg | INHALATION_SOLUTION | Freq: Once | RESPIRATORY_TRACT | Status: DC

## 2019-06-14 MED ORDER — DEXTROSE 50 % IV SOLN
1.0000 | Freq: Once | INTRAVENOUS | Status: AC
  Administered 2019-06-14: 03:00:00 50 mL via INTRAVENOUS
  Filled 2019-06-14: qty 50

## 2019-06-14 MED ORDER — SODIUM BICARBONATE 8.4 % IV SOLN
INTRAVENOUS | Status: AC
  Administered 2019-06-14: 50 meq via INTRAVENOUS
  Filled 2019-06-14: qty 50

## 2019-06-14 MED ORDER — SODIUM BICARBONATE 8.4 % IV SOLN: 50.0000 meq | Freq: Once | INTRAVENOUS | Status: AC

## 2019-06-14 MED ORDER — CALCIUM GLUCONATE 10 % IV SOLN
1.0000 g | Freq: Once | INTRAVENOUS | Status: AC
  Administered 2019-06-14: 1 g via INTRAVENOUS
  Filled 2019-06-14: qty 10

## 2019-06-14 MED ORDER — INSULIN ASPART 100 UNIT/ML IV SOLN
5.0000 [IU] | Freq: Once | INTRAVENOUS | Status: AC
  Administered 2019-06-14: 5 [IU] via INTRAVENOUS

## 2019-06-16 LAB — PATHOLOGIST SMEAR REVIEW

## 2019-06-25 NOTE — Progress Notes (Signed)
Pt satting in 62s and 80s despite being on ventilator @ 100% FiO2. Sood MD expressed to family that there is "nothing else to offer at this time". Family at bedside (son, daughter, sister, granddaughter) all made aware of her critical condition and severity of illness. Daughter and son made clear their wishes were for her to remain on ventilator until tomorrow morning when they could discuss comfort measures with providers. Pt is no longer making urine and exhibiting cardiac symptoms including peaked T waves, hypotension on vasopressin and norepinephrine, and atrial/ventricular ectopy. Notified [facility] provider and sent morning labs @ 0115 to check electrolytes. Family stated they would like to be called if patient is suspected to be nearing end of life.

## 2019-06-25 NOTE — Progress Notes (Signed)
Notified Elink provider hemoglobin 6.9, potassium 7.3. EKG obtained and transmitted into EMR. Pt w/ increased ectopy and peaked T waves.

## 2019-06-25 NOTE — Progress Notes (Signed)
Pt expired @ 0250, [facility]ink provider Wyn Forster MD notified. Pt's daughter, Jaleeza Shimp, and son, Annajean Patruno, both notified and arranged visitation of the deceased at this time. Post mortem checklist complete; CDS notified; post-mortem care performed.

## 2019-06-25 NOTE — Progress Notes (Signed)
CDS notified of death, Referral number [id number], not suitable for organs, tissues or eyes

## 2019-06-25 NOTE — Progress Notes (Signed)
eLink Physician-Brief Progress Note Patient Name: Anne [name] DOB: [birth year] MRN: [id number]   Date of Service  2019/06/22  HPI/Events of Note  K+ 7.3, Hemoglobin 6.9 gm%  eICU Interventions  Adult severe hyperkalemia orders entered, will defer transfusion for hemoglobin of 6.9 gm until hyperkalemia is corrected.        Lashone Stauber U Vandy Fong 2019/06/22, 2:30 AM

## 2019-06-25 NOTE — Progress Notes (Signed)
[facility] Progress Note Patient Name: Anne [name] DOB: 12-Sep-1955 MRN: [id number]   Date of Service  06/20/19  HPI/Events of Note  Peaked T waves on rhythm strip  eICU Interventions  Check electrolytes and 12 lead EKG.        Ron Beske U Henslee Lottman 06-20-2019, 1:32 AM

## 2019-06-25 NOTE — Death Summary Note (Signed)
Anne [name] was a 64 y.o. female admitted on 05/23/19 to [facility] with COVID 19 pneumonia.  She developed worsening hypoxia and acidosis.  She was transferred to ICU and required intubation.  Eventually transferred to [facility] on 05/27/19.  She remained on increased levels of PEEP and FiO2.  CT imaging showed change of cirrhosis and atherosclerosis.  She developed worsening respiratory status on 06/08/19.  Found to have VRE in her sputum.  Started on antibiotics for this.  Had increasing difficulty with oxygenation.  Family agreed that CPR would be futile.  She was made DNR.  She subsequently passed on 06/27/2019 at 2:50 am.  Final diagnosis: Acute hypoxic respiratory failure with ARDS from COVID 19 pneumonia Vancomycin resistant Enterococcal HCAP COPD Septic shock from HCAP and UTI, not present on admission Hyperglycemia Acute metabolic encephalopathy from sepsis and hypoxia Anemia of critical illness Sepsis induced thrombocytopenia History of hypertension and hyperlipidemia Chronic diastolic CHF Hepatitis C with cirrhosis Nephrolithiasis Aortic atherosclerosis  Chesley Mires, MD [facility] Pulmonary/Critical Care 06/15/2019, 12:59 PM

## 2019-06-25 DEATH — deceased

## 2021-01-02 IMAGING — DX DG ABDOMEN 1V
1 series · 1 of 1 positions shown · non-contrast
Comparison: Abdominal radiograph dated 05/25/2019.

CLINICAL DATA: 63-year-old female with positive [id number].

EXAM:
ABDOMEN - 1 VIEW

[abdomen]
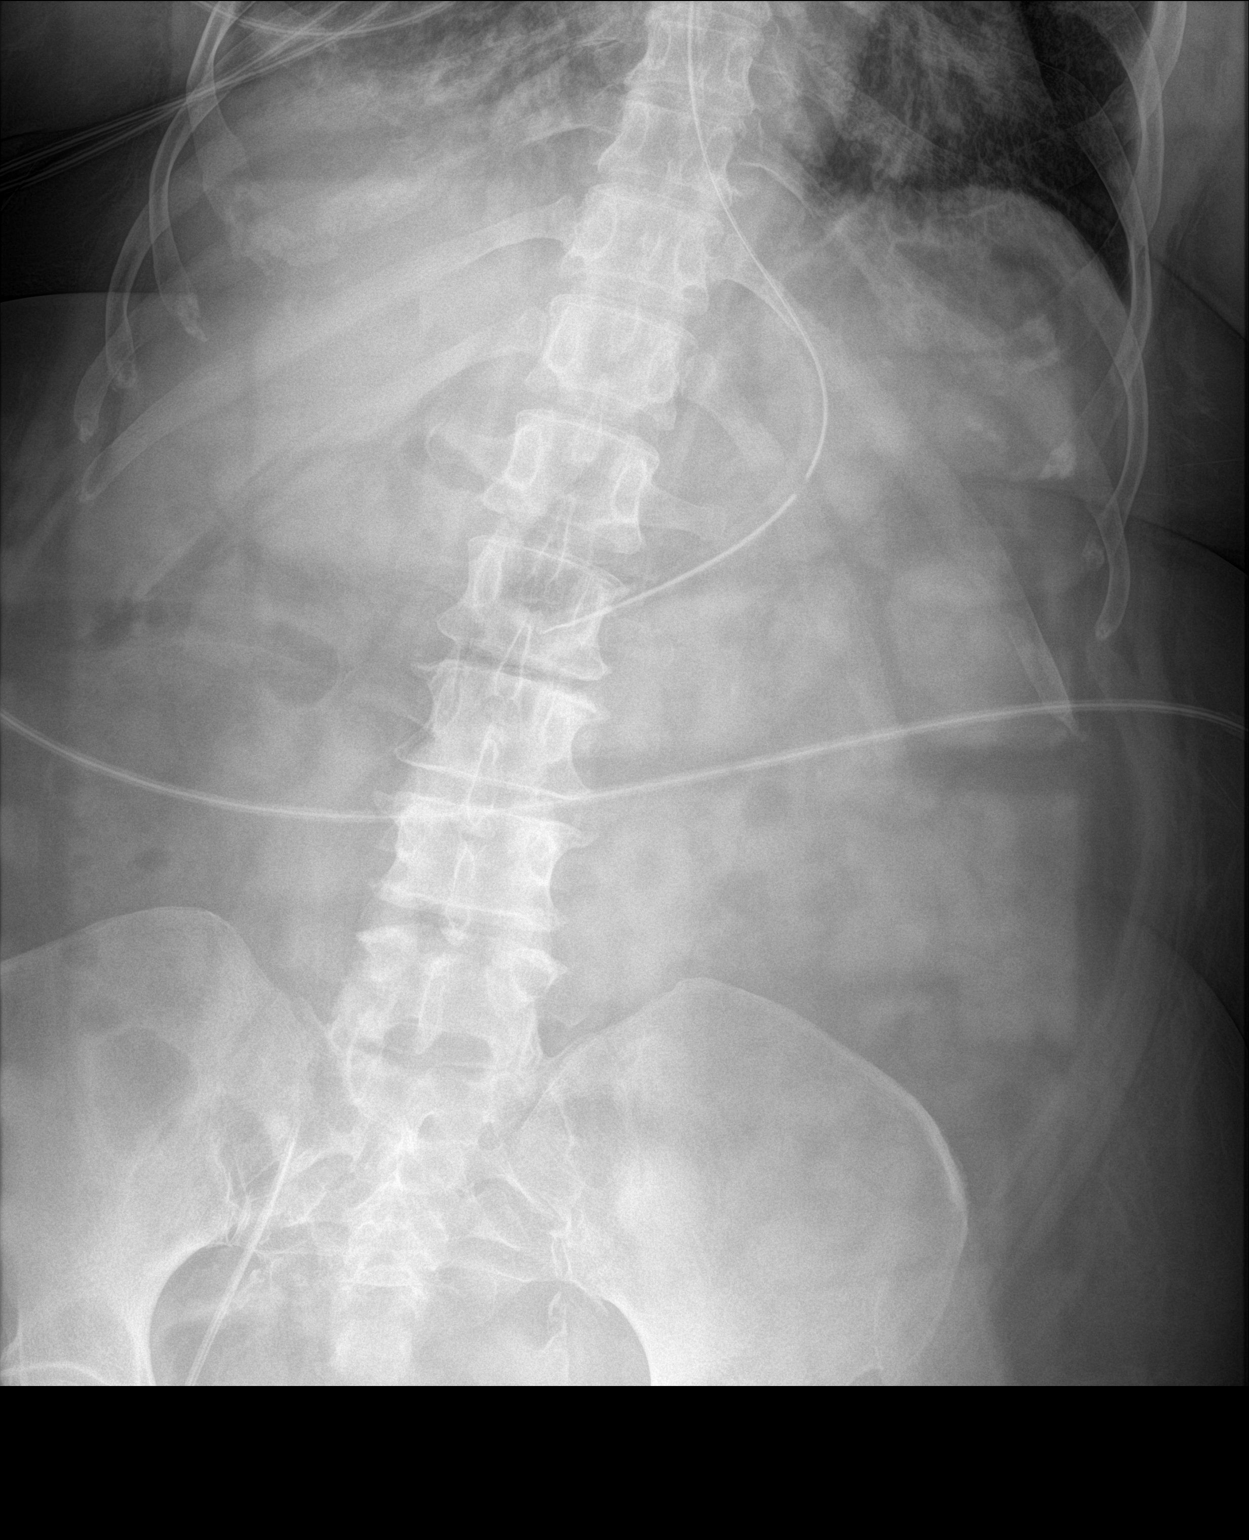

[1 of 1 positions shown; findings below may reference images not displayed]

FINDINGS: An enteric tube is noted with side-port in the proximal stomach and
tip likely in the distal stomach. No bowel dilatation or evidence of
obstruction. No free air identified. No acute osseous pathology.
IMPRESSION: Enteric tube with tip in the distal stomach.

## 2021-01-03 IMAGING — DX DG CHEST 1V PORT
1 series · 1 of 1 positions shown · non-contrast
Comparison: 05/27/2019

CLINICAL DATA: Respiratory failure.

EXAM:
PORTABLE CHEST 1 VIEW

[chest ap]
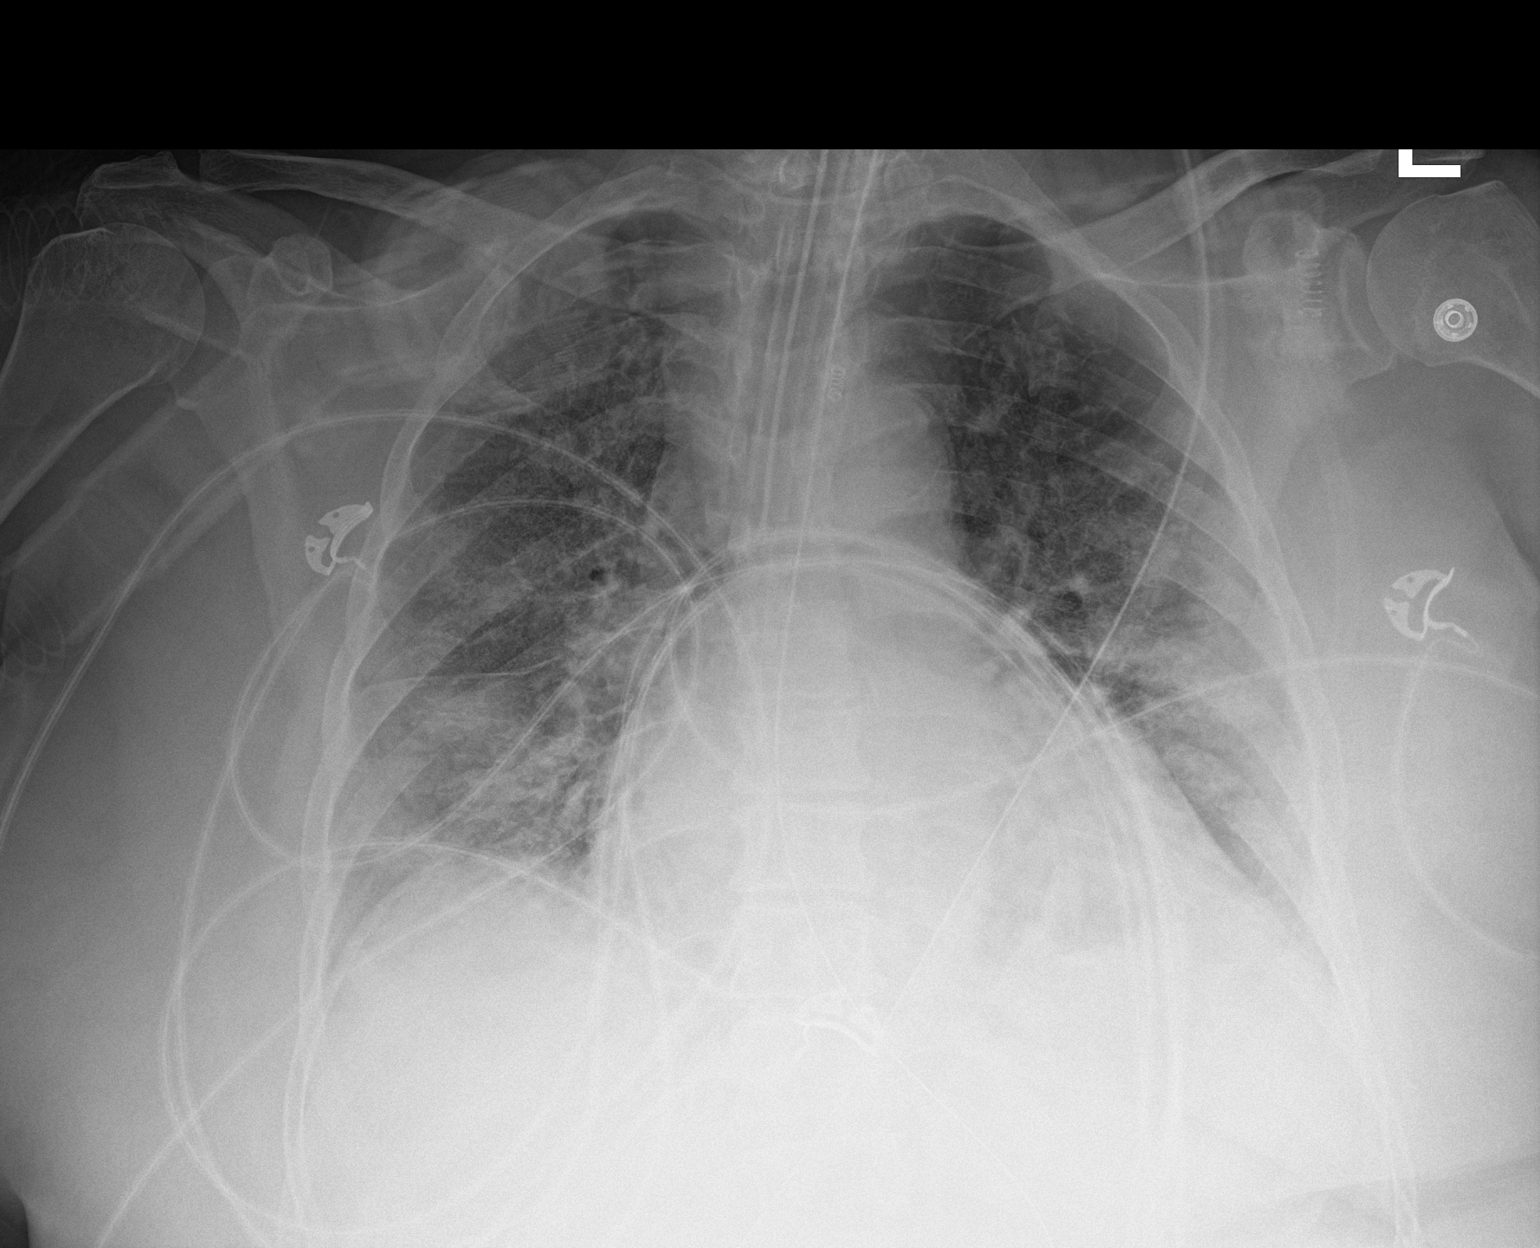

[1 of 1 positions shown; findings below may reference images not displayed]

FINDINGS: Endotracheal tube has advanced, now terminating 1 cm from the
carina. NG tube courses off the inferior border the film. Diffuse
interstitial and airspace opacities have increased.
IMPRESSION: 1. Endotracheal tube is now 1 cm from the carina and could be pulled
back 2 cm for more optimal positioning.
2. Increasing interstitial and airspace disease consistent with
infection, edema, or ARDS.

These results will be called to the ordering clinician or
representative by the Radiologist Assistant, and communication
documented in the PACS or zVision Dashboard.

## 2021-01-05 IMAGING — DX DG CHEST 1V PORT
1 series · 1 of 1 positions shown · non-contrast
Comparison: 05/28/2019

CLINICAL DATA: Respiratory failure, COVID

EXAM:
PORTABLE CHEST 1 VIEW

[chest ap]
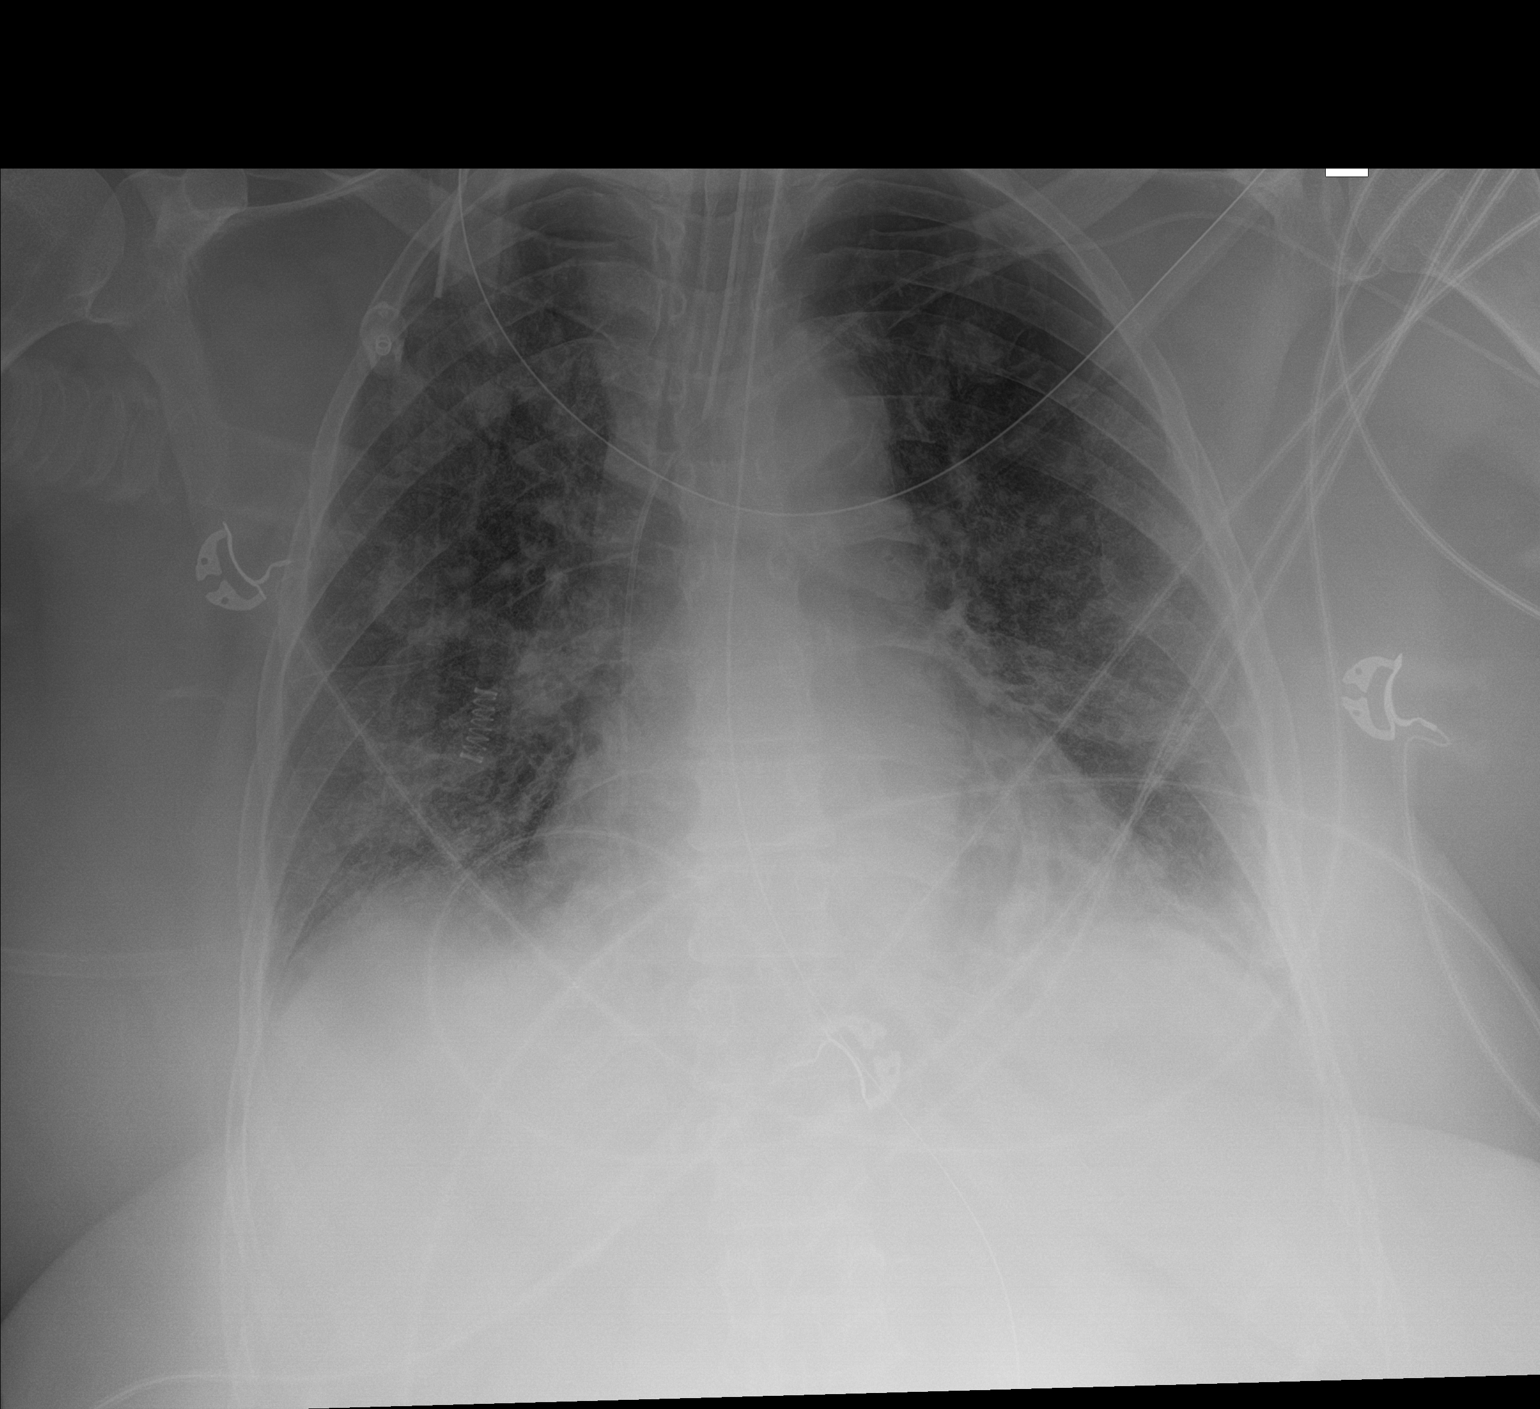

[1 of 1 positions shown; findings below may reference images not displayed]

FINDINGS: Endotracheal tube, enteric tube, and left PICC are again present.
Endotracheal tube has been retracted and is in more optimal
position.

Persistent bilateral pulmonary opacities with overall similar lung
aeration. No pleural effusion. No pneumothorax. Stable
cardiomediastinal contours.
IMPRESSION: Lines and tubes as above.  No substantial change in lung aeration.

## 2021-01-06 IMAGING — DX DG CHEST 1V PORT
1 series · 1 of 1 positions shown · non-contrast
Comparison: Chest radiograph 07/30/2019

CLINICAL DATA: Hypoxia, ET tube placement COVID+

EXAM:
PORTABLE CHEST 1 VIEW

[chest]
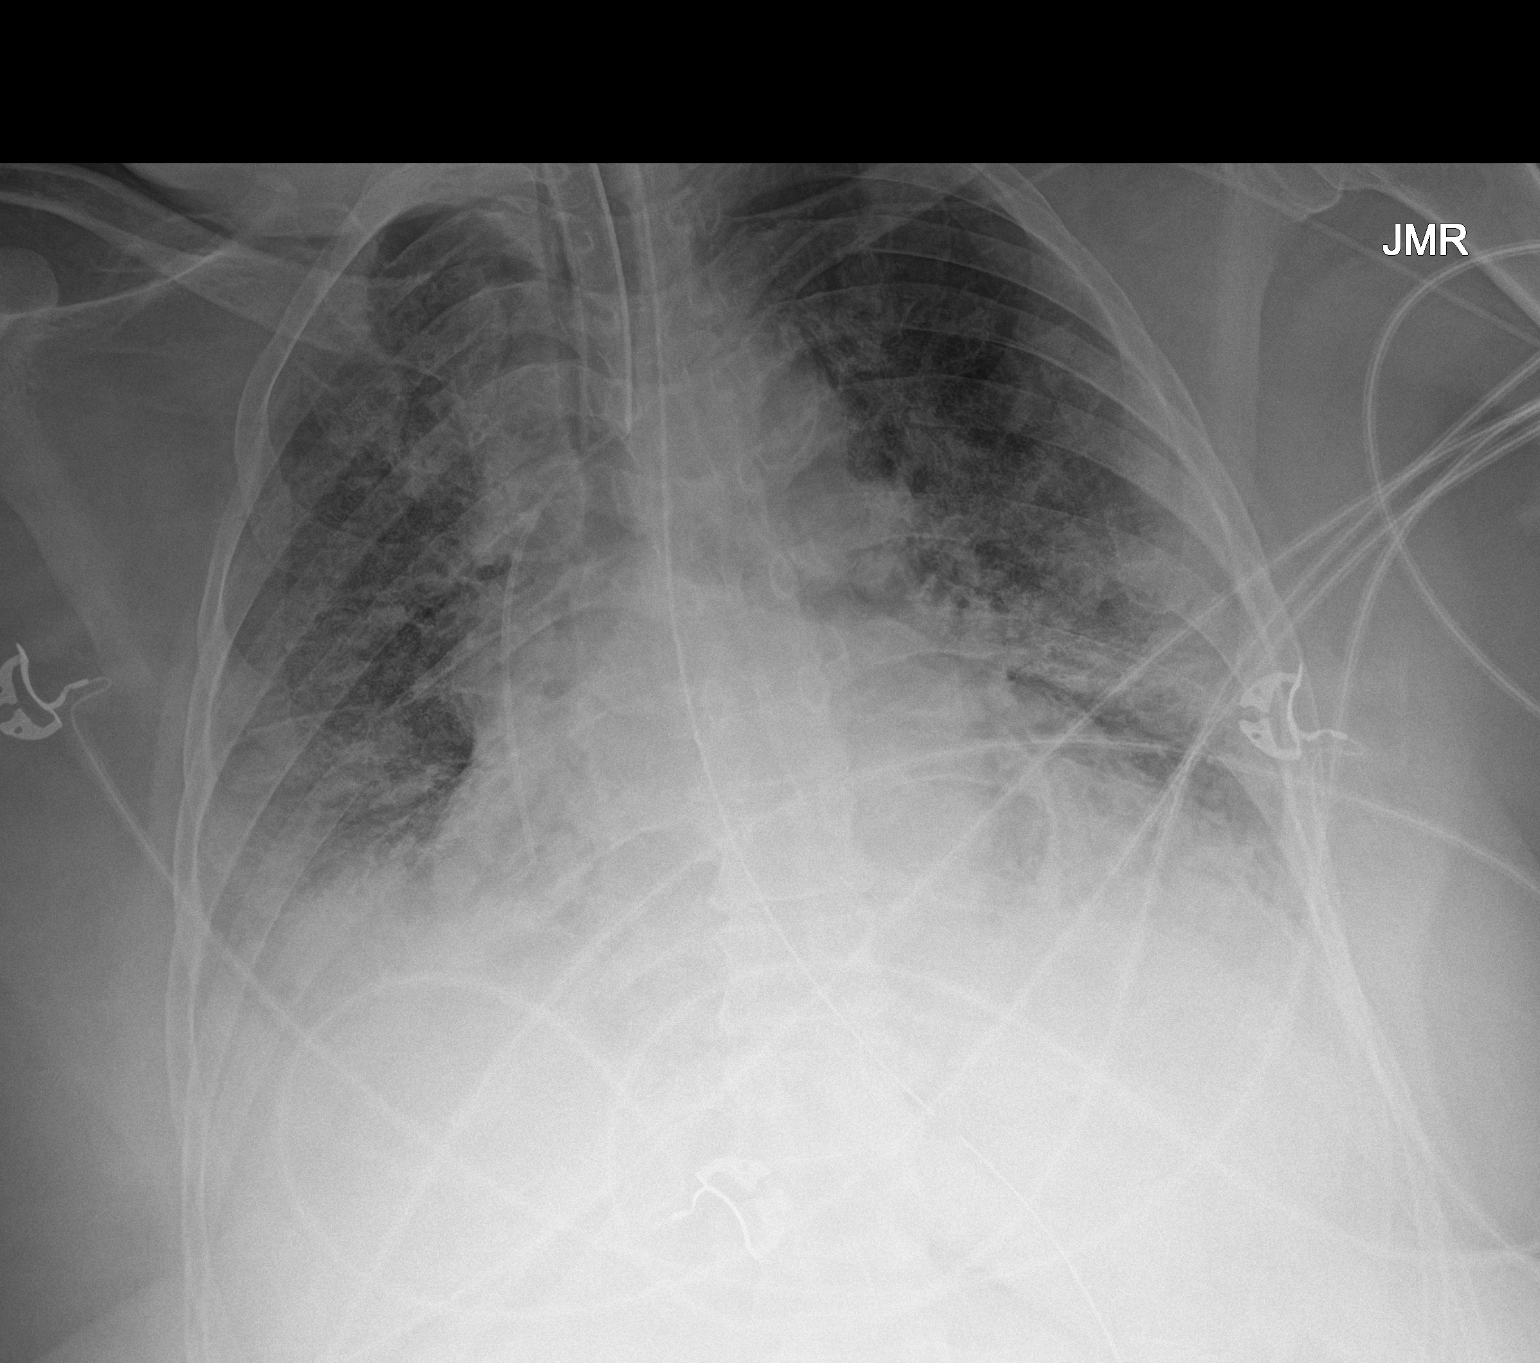

[1 of 1 positions shown; findings below may reference images not displayed]

FINDINGS: Stable support apparatus. Unchanged cardiomediastinal contours.
Worsening diffuse bilateral infiltrates with predominance in the
bilateral lower lobes. No evidence of pneumothorax or significant
pleural effusion.
IMPRESSION: Worsening diffuse bilateral infiltrates consistent with infection.

## 2021-01-09 IMAGING — DX DG CHEST 1V PORT
1 series · 1 of 1 positions shown · non-contrast
Comparison: Chest x-ray 05/30/2019

CLINICAL DATA: Respiratory failure. COVID pneumonia.

EXAM:
PORTABLE CHEST 1 VIEW

[chest]
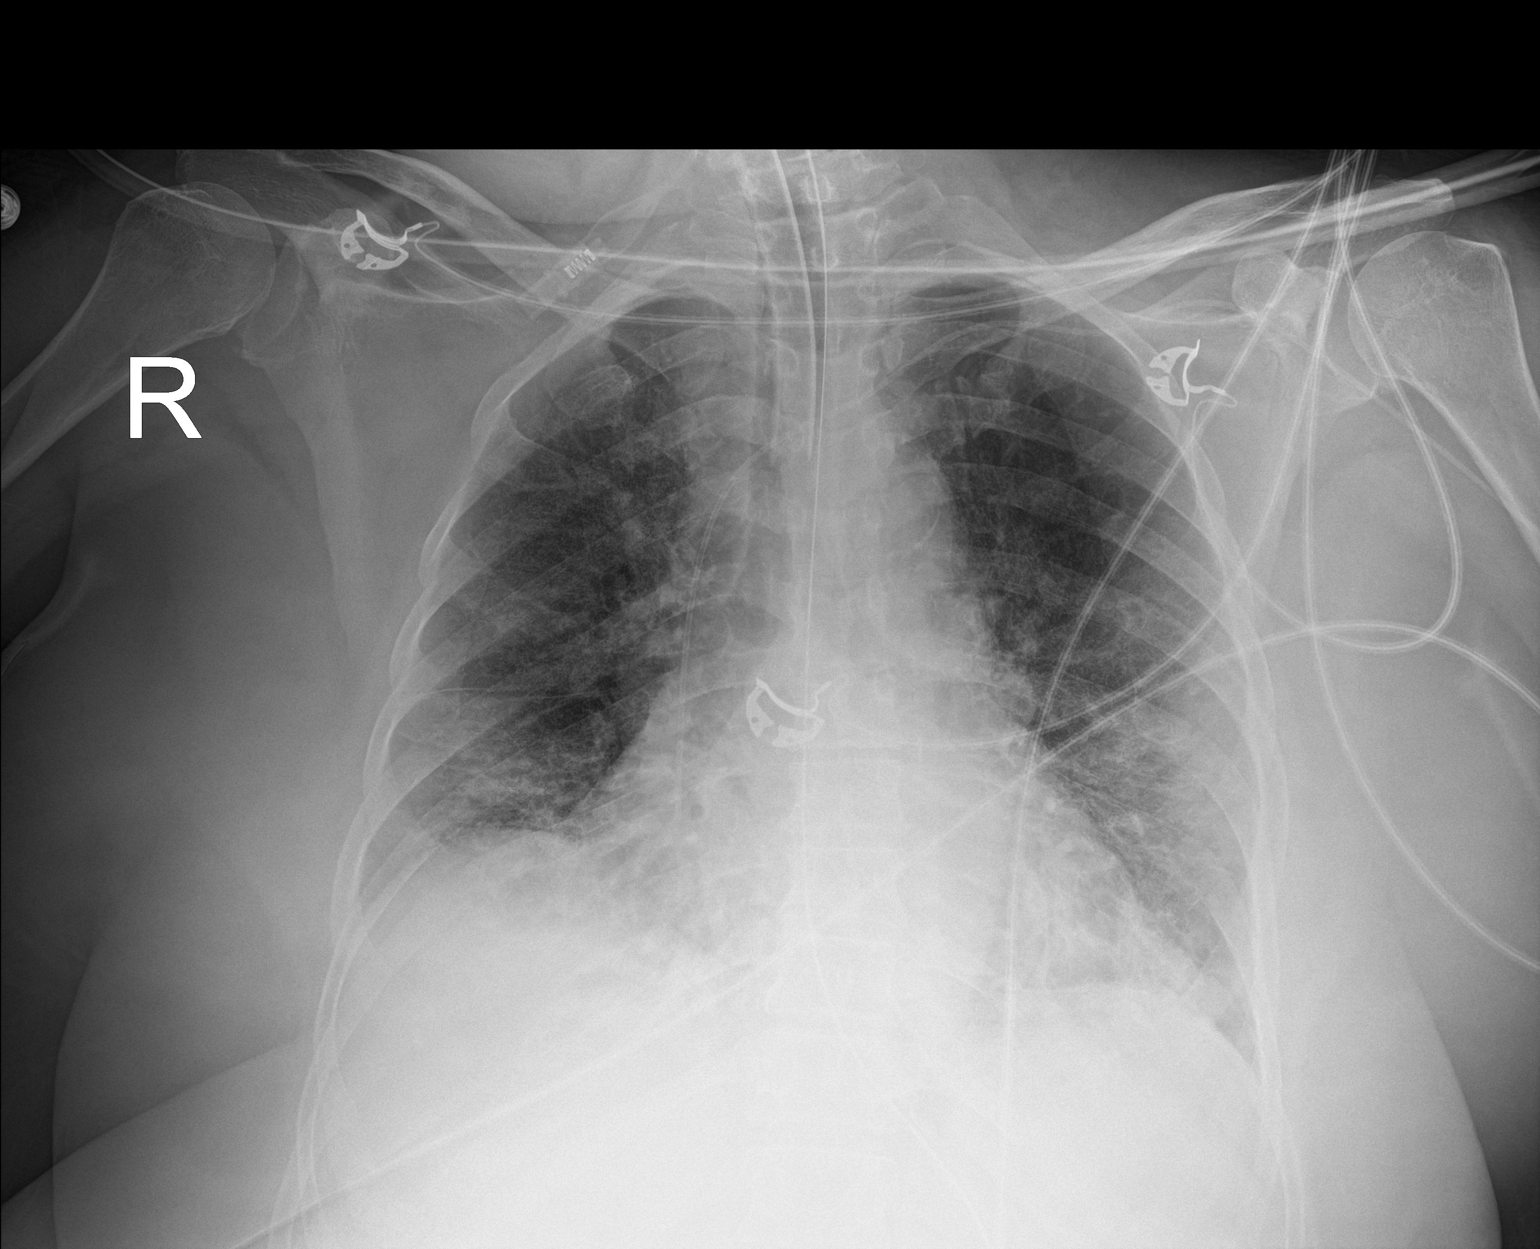

[1 of 1 positions shown; findings below may reference images not displayed]

FINDINGS: The endotracheal tube, NG tube and left PICC line are in good
position, unchanged.

Persistent diffuse interstitial and airspace process in the lungs.
No definite pleural effusions or pneumothorax.
IMPRESSION: 1. Stable support apparatus.
2. Persistent diffuse interstitial and airspace process.
